# Patient Record
Sex: Male | Born: 1945
Health system: Southern US, Community
[De-identification: ages and names within clinical notes are randomized; demographics above are authoritative.]

## PROBLEM LIST (undated history)

## (undated) DIAGNOSIS — I619 Nontraumatic intracerebral hemorrhage, unspecified: Secondary | ICD-10-CM

## (undated) DIAGNOSIS — E7800 Pure hypercholesterolemia, unspecified: Secondary | ICD-10-CM

## (undated) DIAGNOSIS — G4700 Insomnia, unspecified: Secondary | ICD-10-CM

## (undated) DIAGNOSIS — D126 Benign neoplasm of colon, unspecified: Secondary | ICD-10-CM

## (undated) DIAGNOSIS — K635 Polyp of colon: Secondary | ICD-10-CM

## (undated) DIAGNOSIS — K921 Melena: Secondary | ICD-10-CM

## (undated) DIAGNOSIS — R079 Chest pain, unspecified: Secondary | ICD-10-CM

## (undated) DIAGNOSIS — J309 Allergic rhinitis, unspecified: Secondary | ICD-10-CM

## (undated) DIAGNOSIS — D485 Neoplasm of uncertain behavior of skin: Secondary | ICD-10-CM

## (undated) DIAGNOSIS — I2510 Atherosclerotic heart disease of native coronary artery without angina pectoris: Secondary | ICD-10-CM

## (undated) DIAGNOSIS — T7840XA Allergy, unspecified, initial encounter: Secondary | ICD-10-CM

## (undated) DIAGNOSIS — Z8546 Personal history of malignant neoplasm of prostate: Secondary | ICD-10-CM

## (undated) DIAGNOSIS — I639 Cerebral infarction, unspecified: Secondary | ICD-10-CM

## (undated) DIAGNOSIS — R55 Syncope and collapse: Secondary | ICD-10-CM

## (undated) DIAGNOSIS — C801 Malignant (primary) neoplasm, unspecified: Secondary | ICD-10-CM

## (undated) DIAGNOSIS — F419 Anxiety disorder, unspecified: Secondary | ICD-10-CM

## (undated) DIAGNOSIS — E876 Hypokalemia: Secondary | ICD-10-CM

## (undated) DIAGNOSIS — N400 Enlarged prostate without lower urinary tract symptoms: Secondary | ICD-10-CM

## (undated) DIAGNOSIS — E785 Hyperlipidemia, unspecified: Secondary | ICD-10-CM

## (undated) DIAGNOSIS — I10 Essential (primary) hypertension: Secondary | ICD-10-CM

## (undated) HISTORY — DX: Neoplasm of uncertain behavior of skin: D48.5

## (undated) HISTORY — DX: Hypokalemia: E87.6

## (undated) HISTORY — DX: Anxiety disorder, unspecified: F41.9

## (undated) HISTORY — DX: Personal history of malignant neoplasm of prostate: Z85.46

## (undated) HISTORY — DX: Chest pain, unspecified: R07.9

## (undated) HISTORY — DX: Malignant (primary) neoplasm, unspecified: C80.1

## (undated) HISTORY — DX: Melena: K92.1

## (undated) HISTORY — DX: Hyperlipidemia, unspecified: E78.5

## (undated) HISTORY — PX: TONSILLECTOMY: SUR1361

## (undated) HISTORY — DX: Cerebral infarction, unspecified: I63.9

## (undated) HISTORY — DX: Polyp of colon: K63.5

## (undated) HISTORY — DX: Essential (primary) hypertension: I10

## (undated) HISTORY — DX: Benign prostatic hyperplasia without lower urinary tract symptoms: N40.0

## (undated) HISTORY — DX: Nontraumatic intracerebral hemorrhage, unspecified: I61.9

## (undated) HISTORY — DX: Atherosclerotic heart disease of native coronary artery without angina pectoris: I25.10

## (undated) HISTORY — PX: ANGIOPLASTY: SHX39

## (undated) HISTORY — DX: Allergic rhinitis, unspecified: J30.9

## (undated) HISTORY — DX: Allergy, unspecified, initial encounter: T78.40XA

## (undated) HISTORY — PX: COLONOSCOPY: SHX174

## (undated) HISTORY — DX: Pure hypercholesterolemia, unspecified: E78.00

## (undated) HISTORY — DX: Insomnia, unspecified: G47.00

## (undated) HISTORY — DX: Benign neoplasm of colon, unspecified: D12.6

## (undated) HISTORY — DX: Syncope and collapse: R55

## (undated) HISTORY — PX: POLYPECTOMY: SHX149

---

## 1999-01-31 ENCOUNTER — Inpatient Hospital Stay (HOSPITAL_COMMUNITY): Admission: EM | Admit: 1999-01-31 | Discharge: 1999-02-01 | Payer: Self-pay | Admitting: Emergency Medicine

## 1999-01-31 ENCOUNTER — Encounter: Payer: Self-pay | Admitting: Emergency Medicine

## 2000-08-29 ENCOUNTER — Ambulatory Visit (HOSPITAL_COMMUNITY): Admission: RE | Admit: 2000-08-29 | Discharge: 2000-08-29 | Payer: Self-pay | Admitting: Cardiology

## 2001-05-10 ENCOUNTER — Emergency Department (HOSPITAL_COMMUNITY): Admission: EM | Admit: 2001-05-10 | Discharge: 2001-05-10 | Payer: Self-pay | Admitting: Emergency Medicine

## 2001-05-10 ENCOUNTER — Encounter: Payer: Self-pay | Admitting: Emergency Medicine

## 2004-12-12 ENCOUNTER — Ambulatory Visit: Payer: Self-pay | Admitting: Internal Medicine

## 2005-01-02 ENCOUNTER — Ambulatory Visit: Payer: Self-pay | Admitting: Internal Medicine

## 2005-01-10 ENCOUNTER — Ambulatory Visit: Payer: Self-pay | Admitting: Endocrinology

## 2005-03-19 ENCOUNTER — Ambulatory Visit: Payer: Self-pay | Admitting: Internal Medicine

## 2005-04-09 ENCOUNTER — Ambulatory Visit: Payer: Self-pay | Admitting: Internal Medicine

## 2005-04-30 ENCOUNTER — Ambulatory Visit: Payer: Self-pay | Admitting: *Deleted

## 2005-05-02 ENCOUNTER — Ambulatory Visit: Payer: Self-pay | Admitting: Gastroenterology

## 2005-05-21 ENCOUNTER — Ambulatory Visit: Payer: Self-pay | Admitting: Internal Medicine

## 2005-05-25 ENCOUNTER — Emergency Department (HOSPITAL_COMMUNITY): Admission: EM | Admit: 2005-05-25 | Discharge: 2005-05-25 | Payer: Self-pay | Admitting: Emergency Medicine

## 2005-05-30 ENCOUNTER — Ambulatory Visit: Payer: Self-pay | Admitting: Gastroenterology

## 2006-06-04 ENCOUNTER — Inpatient Hospital Stay (HOSPITAL_COMMUNITY): Admission: EM | Admit: 2006-06-04 | Discharge: 2006-06-07 | Payer: Self-pay | Admitting: Emergency Medicine

## 2006-06-04 ENCOUNTER — Ambulatory Visit: Payer: Self-pay | Admitting: Internal Medicine

## 2006-06-11 ENCOUNTER — Ambulatory Visit: Payer: Self-pay | Admitting: Internal Medicine

## 2007-03-13 ENCOUNTER — Ambulatory Visit: Payer: Self-pay | Admitting: Internal Medicine

## 2007-04-02 ENCOUNTER — Ambulatory Visit: Payer: Self-pay | Admitting: Cardiovascular Disease

## 2007-07-24 ENCOUNTER — Ambulatory Visit: Payer: Self-pay | Admitting: Internal Medicine

## 2007-11-13 ENCOUNTER — Encounter: Payer: Self-pay | Admitting: Internal Medicine

## 2007-12-03 DIAGNOSIS — C801 Malignant (primary) neoplasm, unspecified: Secondary | ICD-10-CM

## 2007-12-03 DIAGNOSIS — Z8546 Personal history of malignant neoplasm of prostate: Secondary | ICD-10-CM

## 2007-12-03 HISTORY — DX: Malignant (primary) neoplasm, unspecified: C80.1

## 2007-12-03 HISTORY — PX: PROSTATECTOMY: SHX69

## 2007-12-03 HISTORY — DX: Personal history of malignant neoplasm of prostate: Z85.46

## 2007-12-31 ENCOUNTER — Encounter: Payer: Self-pay | Admitting: Urology

## 2007-12-31 ENCOUNTER — Inpatient Hospital Stay (HOSPITAL_COMMUNITY): Admission: RE | Admit: 2007-12-31 | Discharge: 2008-01-05 | Payer: Self-pay | Admitting: Urology

## 2008-03-24 ENCOUNTER — Encounter: Payer: Self-pay | Admitting: Internal Medicine

## 2008-03-25 ENCOUNTER — Encounter: Payer: Self-pay | Admitting: Internal Medicine

## 2008-03-29 ENCOUNTER — Telehealth: Payer: Self-pay | Admitting: Internal Medicine

## 2008-11-14 ENCOUNTER — Ambulatory Visit: Payer: Self-pay | Admitting: Internal Medicine

## 2008-11-14 DIAGNOSIS — Z8546 Personal history of malignant neoplasm of prostate: Secondary | ICD-10-CM

## 2008-11-14 DIAGNOSIS — I2510 Atherosclerotic heart disease of native coronary artery without angina pectoris: Secondary | ICD-10-CM | POA: Insufficient documentation

## 2008-11-14 DIAGNOSIS — J309 Allergic rhinitis, unspecified: Secondary | ICD-10-CM

## 2008-11-14 DIAGNOSIS — E785 Hyperlipidemia, unspecified: Secondary | ICD-10-CM

## 2008-11-14 DIAGNOSIS — K921 Melena: Secondary | ICD-10-CM

## 2008-11-14 DIAGNOSIS — I10 Essential (primary) hypertension: Secondary | ICD-10-CM | POA: Insufficient documentation

## 2008-11-16 ENCOUNTER — Ambulatory Visit: Payer: Self-pay | Admitting: Internal Medicine

## 2008-11-17 LAB — CONVERTED CEMR LAB
ALT: 18 units/L (ref 0–53)
AST: 19 units/L (ref 0–37)
Albumin: 4 g/dL (ref 3.5–5.2)
BUN: 17 mg/dL (ref 6–23)
Basophils Absolute: 0 10*3/uL (ref 0.0–0.1)
Basophils Relative: 0.1 % (ref 0.0–3.0)
CO2: 28 meq/L (ref 19–32)
Calcium: 9.3 mg/dL (ref 8.4–10.5)
Chloride: 105 meq/L (ref 96–112)
Creatinine, Ser: 1.1 mg/dL (ref 0.4–1.5)
Direct LDL: 250.3 mg/dL
Eosinophils Relative: 1.2 % (ref 0.0–5.0)
Glucose, Bld: 107 mg/dL — ABNORMAL HIGH (ref 70–99)
Hemoglobin: 16.2 g/dL (ref 13.0–17.0)
Lymphocytes Relative: 25.8 % (ref 12.0–46.0)
MCHC: 34.7 g/dL (ref 30.0–36.0)
Neutro Abs: 2.3 10*3/uL (ref 1.4–7.7)
RBC: 5.15 M/uL (ref 4.22–5.81)
TSH: 1.46 microintl units/mL (ref 0.35–5.50)
Total Bilirubin: 0.9 mg/dL (ref 0.3–1.2)
Total Protein: 6.5 g/dL (ref 6.0–8.3)
VLDL: 25 mg/dL (ref 0–40)
WBC: 3.6 10*3/uL — ABNORMAL LOW (ref 4.5–10.5)

## 2008-11-23 ENCOUNTER — Telehealth (INDEPENDENT_AMBULATORY_CARE_PROVIDER_SITE_OTHER): Payer: Self-pay | Admitting: *Deleted

## 2008-12-02 DIAGNOSIS — D126 Benign neoplasm of colon, unspecified: Secondary | ICD-10-CM

## 2008-12-02 HISTORY — DX: Benign neoplasm of colon, unspecified: D12.6

## 2009-01-13 ENCOUNTER — Ambulatory Visit: Payer: Self-pay | Admitting: Gastroenterology

## 2009-01-18 ENCOUNTER — Telehealth: Payer: Self-pay | Admitting: Gastroenterology

## 2009-01-24 ENCOUNTER — Encounter: Payer: Self-pay | Admitting: Gastroenterology

## 2009-01-24 ENCOUNTER — Ambulatory Visit: Payer: Self-pay | Admitting: Gastroenterology

## 2009-01-25 ENCOUNTER — Encounter: Payer: Self-pay | Admitting: Gastroenterology

## 2009-02-15 ENCOUNTER — Ambulatory Visit: Payer: Self-pay | Admitting: Internal Medicine

## 2009-02-16 LAB — CONVERTED CEMR LAB
ALT: 39 units/L (ref 0–53)
BUN: 11 mg/dL (ref 6–23)
Bilirubin, Direct: 0.2 mg/dL (ref 0.0–0.3)
CO2: 31 meq/L (ref 19–32)
Calcium: 9.2 mg/dL (ref 8.4–10.5)
Chloride: 103 meq/L (ref 96–112)
Cholesterol: 165 mg/dL (ref 0–200)
Creatinine, Ser: 1.1 mg/dL (ref 0.4–1.5)
Eosinophils Absolute: 0 10*3/uL (ref 0.0–0.7)
Eosinophils Relative: 1.2 % (ref 0.0–5.0)
HCT: 44.3 % (ref 39.0–52.0)
HDL: 37.9 mg/dL — ABNORMAL LOW (ref >39.00)
LDL Cholesterol: 113 mg/dL — ABNORMAL HIGH (ref 0–99)
Lymphs Abs: 0.9 10*3/uL (ref 0.7–4.0)
MCHC: 34.7 g/dL (ref 30.0–36.0)
MCV: 90.4 fL (ref 78.0–100.0)
Monocytes Absolute: 0.3 10*3/uL (ref 0.1–1.0)
Neutrophils Relative %: 68.1 % (ref 43.0–77.0)
Platelets: 133 10*3/uL — ABNORMAL LOW (ref 150.0–400.0)
Total Bilirubin: 1.2 mg/dL (ref 0.3–1.2)
Total CHOL/HDL Ratio: 4
Total Protein: 6.6 g/dL (ref 6.0–8.3)
Triglycerides: 71 mg/dL (ref 0.0–149.0)

## 2009-02-22 ENCOUNTER — Ambulatory Visit: Payer: Self-pay | Admitting: Internal Medicine

## 2009-02-22 DIAGNOSIS — G4700 Insomnia, unspecified: Secondary | ICD-10-CM | POA: Insufficient documentation

## 2009-02-22 DIAGNOSIS — D485 Neoplasm of uncertain behavior of skin: Secondary | ICD-10-CM | POA: Insufficient documentation

## 2009-03-20 ENCOUNTER — Encounter: Payer: Self-pay | Admitting: Internal Medicine

## 2009-03-21 ENCOUNTER — Encounter: Payer: Self-pay | Admitting: Internal Medicine

## 2009-03-21 ENCOUNTER — Ambulatory Visit: Payer: Self-pay | Admitting: Internal Medicine

## 2009-03-24 ENCOUNTER — Encounter: Payer: Self-pay | Admitting: Internal Medicine

## 2009-03-27 ENCOUNTER — Telehealth (INDEPENDENT_AMBULATORY_CARE_PROVIDER_SITE_OTHER): Payer: Self-pay | Admitting: *Deleted

## 2009-04-10 ENCOUNTER — Encounter: Payer: Self-pay | Admitting: Internal Medicine

## 2009-05-27 ENCOUNTER — Emergency Department (HOSPITAL_COMMUNITY): Admission: EM | Admit: 2009-05-27 | Discharge: 2009-05-27 | Payer: Self-pay | Admitting: Family Medicine

## 2009-06-12 ENCOUNTER — Ambulatory Visit: Payer: Self-pay | Admitting: Internal Medicine

## 2009-08-14 ENCOUNTER — Inpatient Hospital Stay (HOSPITAL_COMMUNITY): Admission: EM | Admit: 2009-08-14 | Discharge: 2009-08-16 | Payer: Self-pay | Admitting: Emergency Medicine

## 2009-08-14 ENCOUNTER — Ambulatory Visit: Payer: Self-pay | Admitting: Cardiovascular Disease

## 2009-08-15 ENCOUNTER — Ambulatory Visit: Payer: Self-pay | Admitting: Cardiovascular Disease

## 2009-08-15 ENCOUNTER — Other Ambulatory Visit: Payer: Self-pay | Admitting: Cardiology

## 2009-08-16 ENCOUNTER — Other Ambulatory Visit: Payer: Self-pay | Admitting: Cardiology

## 2009-08-21 ENCOUNTER — Encounter: Payer: Self-pay | Admitting: Cardiovascular Disease

## 2009-08-22 ENCOUNTER — Encounter: Payer: Self-pay | Admitting: Cardiovascular Disease

## 2009-08-31 DIAGNOSIS — R079 Chest pain, unspecified: Secondary | ICD-10-CM

## 2009-08-31 DIAGNOSIS — E876 Hypokalemia: Secondary | ICD-10-CM

## 2009-08-31 DIAGNOSIS — F411 Generalized anxiety disorder: Secondary | ICD-10-CM | POA: Insufficient documentation

## 2009-09-04 ENCOUNTER — Ambulatory Visit: Payer: Self-pay | Admitting: Cardiovascular Disease

## 2009-09-05 ENCOUNTER — Ambulatory Visit: Payer: Self-pay | Admitting: Internal Medicine

## 2009-09-05 LAB — CONVERTED CEMR LAB
Albumin: 4.1 g/dL (ref 3.5–5.2)
Basophils Absolute: 0 10*3/uL (ref 0.0–0.1)
CO2: 30 meq/L (ref 19–32)
Calcium: 9.3 mg/dL (ref 8.4–10.5)
Cholesterol: 160 mg/dL (ref 0–200)
Creatinine, Ser: 1.2 mg/dL (ref 0.4–1.5)
GFR calc non Af Amer: 64.71 mL/min (ref >60)
Glucose, Bld: 104 mg/dL — ABNORMAL HIGH (ref 70–99)
HCT: 43.3 % (ref 39.0–52.0)
HDL: 38.7 mg/dL — ABNORMAL LOW (ref >39.00)
Hemoglobin: 14.9 g/dL (ref 13.0–17.0)
Lymphs Abs: 0.7 10*3/uL (ref 0.7–4.0)
MCHC: 34.6 g/dL (ref 30.0–36.0)
MCV: 92.1 fL (ref 78.0–100.0)
Monocytes Absolute: 0.3 10*3/uL (ref 0.1–1.0)
Monocytes Relative: 8.5 % (ref 3.0–12.0)
Neutro Abs: 2.2 10*3/uL (ref 1.4–7.7)
Platelets: 112 10*3/uL — ABNORMAL LOW (ref 150.0–400.0)
RDW: 13.4 % (ref 11.5–14.6)
Sodium: 142 meq/L (ref 135–145)
Total Protein: 6.7 g/dL (ref 6.0–8.3)
Triglycerides: 62 mg/dL (ref 0.0–149.0)
VLDL: 12.4 mg/dL (ref 0.0–40.0)

## 2009-09-13 ENCOUNTER — Ambulatory Visit: Payer: Self-pay | Admitting: Internal Medicine

## 2009-09-13 DIAGNOSIS — R799 Abnormal finding of blood chemistry, unspecified: Secondary | ICD-10-CM

## 2009-09-13 DIAGNOSIS — R7989 Other specified abnormal findings of blood chemistry: Secondary | ICD-10-CM | POA: Insufficient documentation

## 2009-09-17 ENCOUNTER — Telehealth: Payer: Self-pay | Admitting: Internal Medicine

## 2009-12-18 ENCOUNTER — Ambulatory Visit: Payer: Self-pay | Admitting: Internal Medicine

## 2009-12-18 LAB — CONVERTED CEMR LAB
BUN: 12 mg/dL (ref 6–23)
Basophils Relative: 0.5 % (ref 0.0–3.0)
Calcium: 9.3 mg/dL (ref 8.4–10.5)
Chloride: 108 meq/L (ref 96–112)
Cholesterol: 169 mg/dL (ref 0–200)
Creatinine, Ser: 1.1 mg/dL (ref 0.4–1.5)
HCT: 43.9 % (ref 39.0–52.0)
Hemoglobin: 14.6 g/dL (ref 13.0–17.0)
LDL Cholesterol: 111 mg/dL — ABNORMAL HIGH (ref 0–99)
Lymphocytes Relative: 23.3 % (ref 12.0–46.0)
MCHC: 33.3 g/dL (ref 30.0–36.0)
Monocytes Relative: 9.6 % (ref 3.0–12.0)
Neutro Abs: 2.4 10*3/uL (ref 1.4–7.7)
RBC: 4.74 M/uL (ref 4.22–5.81)
Total CHOL/HDL Ratio: 4

## 2009-12-25 ENCOUNTER — Encounter: Payer: Self-pay | Admitting: Internal Medicine

## 2009-12-28 ENCOUNTER — Telehealth: Payer: Self-pay | Admitting: Internal Medicine

## 2010-01-04 ENCOUNTER — Ambulatory Visit: Payer: Self-pay | Admitting: Internal Medicine

## 2010-06-18 ENCOUNTER — Telehealth: Payer: Self-pay | Admitting: Internal Medicine

## 2010-06-19 ENCOUNTER — Telehealth: Payer: Self-pay | Admitting: Internal Medicine

## 2010-06-22 ENCOUNTER — Ambulatory Visit: Payer: Self-pay | Admitting: Internal Medicine

## 2010-06-22 LAB — CONVERTED CEMR LAB
ALT: 27 units/L (ref 0–53)
AST: 29 units/L (ref 0–37)
Albumin: 4.2 g/dL (ref 3.5–5.2)
BUN: 16 mg/dL (ref 6–23)
Basophils Relative: 0.5 % (ref 0.0–3.0)
Chloride: 104 meq/L (ref 96–112)
Cholesterol: 168 mg/dL (ref 0–200)
Eosinophils Absolute: 0.1 10*3/uL (ref 0.0–0.7)
Glucose, Bld: 101 mg/dL — ABNORMAL HIGH (ref 70–99)
Hemoglobin: 15.1 g/dL (ref 13.0–17.0)
Lymphocytes Relative: 19.4 % (ref 12.0–46.0)
MCHC: 34.9 g/dL (ref 30.0–36.0)
MCV: 90.3 fL (ref 78.0–100.0)
Monocytes Absolute: 0.3 10*3/uL (ref 0.1–1.0)
Neutro Abs: 3.2 10*3/uL (ref 1.4–7.7)
Potassium: 4 meq/L (ref 3.5–5.1)
RBC: 4.8 M/uL (ref 4.22–5.81)
Total Protein: 6.5 g/dL (ref 6.0–8.3)

## 2010-06-26 ENCOUNTER — Ambulatory Visit: Payer: Self-pay | Admitting: Internal Medicine

## 2010-10-24 ENCOUNTER — Ambulatory Visit: Payer: Self-pay | Admitting: Internal Medicine

## 2010-10-28 LAB — CONVERTED CEMR LAB
BUN: 17 mg/dL (ref 6–23)
Basophils Relative: 0.4 % (ref 0.0–3.0)
Chloride: 104 meq/L (ref 96–112)
Cholesterol: 171 mg/dL (ref 0–200)
Creatinine, Ser: 1.2 mg/dL (ref 0.4–1.5)
Eosinophils Relative: 2.9 % (ref 0.0–5.0)
Hemoglobin: 14.9 g/dL (ref 13.0–17.0)
LDL Cholesterol: 116 mg/dL — ABNORMAL HIGH (ref 0–99)
Lymphocytes Relative: 19 % (ref 12.0–46.0)
MCHC: 34.3 g/dL (ref 30.0–36.0)
Monocytes Relative: 8.2 % (ref 3.0–12.0)
Neutro Abs: 3.4 10*3/uL (ref 1.4–7.7)
RBC: 4.75 M/uL (ref 4.22–5.81)
Total CHOL/HDL Ratio: 4
WBC: 4.9 10*3/uL (ref 4.5–10.5)

## 2010-10-31 ENCOUNTER — Encounter: Payer: Self-pay | Admitting: Internal Medicine

## 2010-11-12 ENCOUNTER — Ambulatory Visit: Payer: Self-pay | Admitting: Cardiovascular Disease

## 2010-11-21 ENCOUNTER — Ambulatory Visit: Payer: Self-pay | Admitting: Internal Medicine

## 2011-01-03 NOTE — Progress Notes (Signed)
Summary: Effient rx  Phone Note Refill Request Message from:  Fax from Pharmacy on December 28, 2009 10:57 AM  Refills Requested: Medication #1:  EFFIENT 10 MG TABS 1 by mouth qd.   Dosage confirmed as above?Dosage Confirmed Pharmacy noted--?can patient switch to 90 day supply?  Initial call taken by: Lucious Groves,  December 28, 2009 10:57 AM  Follow-up for Phone Call        OK 90 d x 3 Follow-up by: Tresa Garter MD,  December 28, 2009 1:02 PM    Prescriptions: EFFIENT 10 MG TABS (PRASUGREL HCL) 1 by mouth qd  #90 x 3   Entered by:   Lamar Sprinkles, CMA   Authorized by:   Tresa Garter MD   Signed by:   Lamar Sprinkles, CMA on 12/28/2009   Method used:   Electronically to        [facility]* (retail)       [address] [place], [place]  [address]       Ph: [phone] or [phone]       Fax: [phone]   RxID:   [id number]

## 2011-01-03 NOTE — Assessment & Plan Note (Signed)
Summary: FOLLOW UP-LB   Vital Signs:  Patient profile:   65 year old male Height:      68 inches Weight:      185 pounds BMI:     28.23 O2 Sat:      97 % on Room air Temp:     97.5 degrees F oral Pulse rate:   55 / minute Pulse rhythm:   regular Resp:     16 per minute BP sitting:   120 / 78  (left arm) Cuff size:   regular  Vitals Entered By: Lanier Prude, CMA(AAMA) (June 26, 2010 4:09 PM)  O2 Flow:  Room air CC: f/u Is Patient Diabetic? No   CC:  f/u.  History of Present Illness: The patient presents for a follow up of hypertension, CAD, hyperlipidemia   Current Medications (verified): 1)  Vytorin 10-40 Mg  Tabs (Ezetimibe-Simvastatin) .... Take 1 Tablet By Mouth Once A Day 2)  Labetalol Hcl 300 Mg  Tabs (Labetalol Hcl) .[name].. 1 By Mouth Bid 3)  Zolpidem Tartrate 10 Mg  Tabs (Zolpidem Tartrate) .... 1/2 or 1 By Mouth At [facility] Prn 4)  Vitamin D3 1000 Unit  Tabs (Cholecalciferol) .[name].. 1 By Mouth Qd 5)  Loratadine 10 Mg  Tabs (Loratadine) .... Once Daily As Needed Allergies 6)  Triamcinolone Acetonide 0.5 % Crea (Triamcinolone Acetonide) .... Use Bid 7)  Benicar Hct 40-25 Mg  Tabs (Olmesartan Medoxomil-Hctz) .... Take 1 Tab By Mouth Daily 8)  Effient 10 Mg Tabs (Prasugrel Hcl) .[name].. 1 By Mouth Qd  Allergies (verified): 1)  ! * Shell Fish 2)  ! 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   Successful PCI of the lesion in the mid LAD using a XIENCE drug- eluting stent with improvement in central narrowing from 95% to 0%. Bruce Elvera Lennox Juanda Chance, MD, [facility] BRB/MEDQ  D:  08/15/2009  T:  08/16/2009  Job:  [id number]   cc:   Verne Carrow, MD Georgina Quint [name], MD   Tonsillectomy  Social History: Last updated: 08/31/2009  The patient uses alcohol occasionally with one glass of   wine on the rare occasion.  He denies the use of tobacco or illicit   drugs.  He has never smoked.  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  He has been divorced for 8 years and has three   children.   Review of Systems  The patient denies fever, chest pain, and abdominal pain.    Physical Exam  General:  NAD, overweight-appearing.   Ears:  External ear exam shows no significant lesions or deformities.  Otoscopic examination reveals clear canals, tympanic membranes are intact bilaterally without bulging, retraction, inflammation or discharge. Hearing is grossly normal bilaterally. Nose:  External nasal examination shows no deformity or  inflammation. Nasal mucosa are pink and moist without lesions or exudates. Mouth:  Oral mucosa and oropharynx without lesions or exudates.  Teeth in good repair. Lungs:  Normal respiratory effort, chest expands symmetrically. Lungs are clear to auscultation, no crackles or wheezes. Heart:  Normal rate and regular rhythm. S1 and S2 normal without gallop, murmur, click, rub or other extra sounds. Abdomen:  Bowel sounds positive,abdomen soft and non-tender without masses, organomegaly or hernias noted. Msk:  No deformity or scoliosis noted of thoracic or lumbar spine.   Extremities:   No clubbing, cyanosis, edema, or deformity noted with normal full range of motion of all joints.   Neurologic:  No cranial nerve deficits noted. Station and gait are normal. Plantar reflexes are down-going bilaterally. DTRs are symmetrical throughout. Sensory, motor and coordinative functions appear intact. Skin:  Intact without suspicious lesions or rashes Psych:  Cognition and judgment appear intact. Alert and cooperative with normal attention span and concentration. No apparent delusions, illusions, hallucinations   Impression & Recommendations:  Problem # 1:  CORONARY ARTERY DISEASE (ICD-414.00) Assessment Unchanged  His updated medication list for this problem includes:    Labetalol Hcl 300 Mg Tabs (Labetalol hcl) .[name]... 1 by mouth bid    Benicar Hct 40-25 Mg Tabs (Olmesartan medoxomil-hctz) .[name]... Take 1 tab by mouth daily    Effient 10 Mg Tabs (Prasugrel hcl) .[name]... 1 by mouth qd  Problem # 2:  HYPERTENSION (ICD-401.9) Assessment: Unchanged  His updated medication list for this problem includes:    Labetalol Hcl 300 Mg Tabs (Labetalol hcl) .[name]... 1 by mouth bid    Benicar Hct 40-25 Mg Tabs (Olmesartan medoxomil-hctz) .[name]... Take 1 tab by mouth daily  BP today: 120/78 Prior BP: 116/74 (01/04/2010)  Labs Reviewed: K+: 4.0 (06/22/2010) Creat: : 1.2 (06/22/2010)   Chol: 168 (06/22/2010)   HDL: 35.10 (06/22/2010)   LDL: 113 (06/22/2010)   TG: 101.0 (06/22/2010)  Problem # 3:  ANXIETY (ICD-300.00) Assessment: Improved  Problem # 4:  HYPERCHOLESTEROLEMIA (ICD-272.0) Assessment: Unchanged  His updated medication list for this problem includes:    Vytorin 10-40 Mg Tabs (Ezetimibe-simvastatin) .[name]... Take 1 tablet by mouth once a day - he will discuss w/Card if to cont or to switch...  Complete Medication List: 1)  Vytorin 10-40 Mg Tabs (Ezetimibe-simvastatin) .... Take 1 tablet by mouth once a day 2)  Labetalol Hcl 300 Mg Tabs (Labetalol hcl) .[name].. 1 by mouth bid 3)  Zolpidem  Tartrate 10 Mg Tabs (Zolpidem tartrate) .... 1/2 or 1 by mouth at hs prn 4)  Vitamin D3 1000 Unit Tabs (Cholecalciferol) .[name].. 1 by mouth qd 5)  Loratadine 10 Mg Tabs (Loratadine) .... Once daily as needed allergies 6)  Triamcinolone Acetonide 0.5 % Crea (Triamcinolone acetonide) .... Use bid 7)  Benicar Hct 40-25 Mg Tabs (Olmesartan medoxomil-hctz) .... Take 1 tab by mouth daily 8)  Effient 10 Mg Tabs (Prasugrel hcl) .[name].. 1 by mouth qd 9)  Flonase 50 Mcg/act Susp (Fluticasone propionate) .[name].. 1 spr each nostr qd as needed  Patient Instructions: 1)  Please schedule a follow-up appointment in 4 months. 2)  BMP prior to visit, ICD-9: 3)  Lipid Panel prior to visit, ICD-9: 995.20 272.20 4)  CBC w/ Diff prior to visit, ICD-9: Prescriptions: ZOLPIDEM TARTRATE 10 MG  TABS (ZOLPIDEM TARTRATE) 1/2 or 1 by mouth at hs prn  #30 x 6   Entered and Authorized by:   Tresa Garter MD   Signed by:   Georgina Quint [name]  MD on 06/26/2010   Method used:   Print then Give to Patient   RxID:   [id number] FLONASE 50 MCG/ACT SUSP (FLUTICASONE PROPIONATE) 1 spr each nostr qd as needed  #1 x 3   Entered and Authorized by:   Tresa Garter MD   Signed by:   Tresa Garter MD on 06/26/2010   Method used:   Print then Give to Patient   RxID:   [id number]

## 2011-01-03 NOTE — Letter (Signed)
Summary: [facility]   Imported By: Sherian Rein 01/05/2010 14:08:12  _____________________________________________________________________  External Attachment:    Type:   Image     Comment:   External Document

## 2011-01-03 NOTE — Assessment & Plan Note (Signed)
Summary: 4 MO ROV /NWS #   Vital Signs:  Patient profile:   65 year old male Height:      68 inches Weight:      187 pounds BMI:     28.54 Temp:     98.2 degrees F oral Pulse rate:   84 / minute Pulse rhythm:   regular Resp:     16 per minute BP sitting:   120 / 90  (left arm) Cuff size:   regular  Vitals Entered By: Lanier Prude, Beverly Gust) (November 21, 2010 10:19 AM) CC: 4 mo f/u  Is Patient Diabetic? No   CC:  4 mo f/u .  History of Present Illness: The patient presents for a follow up of hypertension, diabetes, hyperlipidemia  C/o itchy ears  Current Medications (verified): 1)  Vytorin 10-40 Mg  Tabs (Ezetimibe-Simvastatin) .... Take 1 Tablet By Mouth Once A Day 2)  Labetalol Hcl 300 Mg  Tabs (Labetalol Hcl) .[name].. 1 By Mouth Bid 3)  Zolpidem Tartrate 10 Mg  Tabs (Zolpidem Tartrate) .... 1/2 or 1 By Mouth At [facility] Prn 4)  Vitamin D3 1000 Unit  Tabs (Cholecalciferol) .[name].. 1 By Mouth Qd 5)  Loratadine 10 Mg  Tabs (Loratadine) .... Once Daily As Needed Allergies 6)  Triamcinolone Acetonide 0.5 % Crea (Triamcinolone Acetonide) .... Use Bid 7)  Benicar Hct 40-25 Mg  Tabs (Olmesartan Medoxomil-Hctz) .... Take 1 Tab By Mouth Daily 8)  Effient 10 Mg Tabs (Prasugrel Hcl) .[name].. 1 By Mouth Qd 9)  Flonase 50 Mcg/act Susp (Fluticasone Propionate) .[name].. 1 Spr Each Nostr Qd As Needed  Allergies (verified): 1)  ! * Shell Fish 2)  ! 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  He denies the use of tobacco or illicit   drugs.  He has never smoked.  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  He has been divorced for 8 years and has three   children.   Review of Systems  The patient denies fever, weight loss, weight gain, chest pain, and dyspnea on exertion.    Physical Exam  General:  NAD, overweight-appearing.   Eyes:  No corneal or conjunctival inflammation noted. EOMI. Perrla.  Ears:  Flaky skin in ear canals Mouth:  Oral mucosa and oropharynx without lesions or exudates.  Teeth in good repair. Lungs:  Normal respiratory effort, chest expands symmetrically. Lungs are clear to auscultation, no crackles or wheezes. Heart:  Normal rate and regular rhythm. S1 and S2 normal without gallop, murmur, click, rub or other extra sounds. Abdomen:  Bowel sounds positive,abdomen soft and non-tender without masses, organomegaly or hernias noted. Msk:  No deformity or scoliosis noted of thoracic or lumbar spine.   Extremities:  No clubbing, cyanosis, edema, or deformity noted with normal full range of motion of all joints.   Skin:  Intact without suspicious lesions or rashes Psych:  Cognition and  judgment appear intact. Alert and cooperative with normal attention span and concentration. No apparent delusions, illusions, hallucinations   Impression & Recommendations:  Problem # 1:  CORONARY ARTERY DISEASE (ICD-414.00) Assessment New  His updated medication list for this problem includes:    Labetalol Hcl 300 Mg Tabs (Labetalol hcl) .[name]... 1 by mouth bid    Benicar Hct 40-25 Mg Tabs (Olmesartan medoxomil-hctz) .[name]... Take 1 tab by mouth daily    Effient 10 Mg Tabs (Prasugrel hcl) .[name]... 1 by mouth qd  Labs Reviewed: Chol: 171 (10/24/2010)   HDL:  39.90 (10/24/2010)   LDL: 116 (10/24/2010)   TG: 75.0 (10/24/2010)  Problem # 2:  CBC, ABNORMAL (ICD-790.99) Assessment: Unchanged The labs were reviewed with the patient.   Problem # 3:  HYPERTENSION (ICD-401.9) Assessment: Unchanged  His updated medication list for this problem includes:    Labetalol Hcl 300 Mg Tabs (Labetalol hcl) .[name]... 1 by mouth bid    Benicar Hct 40-25 Mg Tabs (Olmesartan medoxomil-hctz) .[name]... Take 1 tab by mouth daily  BP today: 120/90 Prior BP: 120/78 (06/26/2010)  Labs Reviewed: K+: 3.6 (10/24/2010) Creat: : 1.2 (10/24/2010)   Chol: 171 (10/24/2010)   HDL: 39.90 (10/24/2010)   LDL: 116 (10/24/2010)   TG: 75.0 (10/24/2010)  Problem # 4:  HYPERCHOLESTEROLEMIA (ICD-272.0) Assessment: Improved  His updated medication list for this problem includes:    Vytorin 10-40 Mg Tabs (Ezetimibe-simvastatin) .[name]... Take 1 tablet by mouth once a day  Labs Reviewed: SGOT: 29 (06/22/2010)   SGPT: 27 (06/22/2010)   HDL:39.90 (10/24/2010), 35.10 (06/22/2010)  LDL:116 (10/24/2010), 113 (06/22/2010)  Chol:171 (10/24/2010), 168 (06/22/2010)  Trig:75.0 (10/24/2010), 101.0 (06/22/2010)  Complete Medication List: 1)  Vytorin 10-40 Mg Tabs (Ezetimibe-simvastatin) .... Take 1 tablet by mouth once a day 2)  Labetalol Hcl 300 Mg Tabs (Labetalol hcl) .[name].. 1 by mouth bid 3)  Zolpidem Tartrate 10 Mg Tabs (Zolpidem tartrate) .... 1/2 or 1 by mouth at hs prn 4)  Vitamin D3 1000 Unit Tabs (Cholecalciferol) .[name].. 1 by mouth qd 5)  Loratadine 10 Mg Tabs (Loratadine) .... Once daily as needed allergies 6)  Triamcinolone Acetonide 0.5 % Crea (Triamcinolone acetonide) .... Use bid 7)  Benicar Hct 40-25 Mg Tabs (Olmesartan medoxomil-hctz) .... Take 1 tab by mouth daily 8)  Effient 10 Mg Tabs (Prasugrel hcl) .[name].. 1 by mouth qd 9)  Flonase 50 Mcg/act Susp (Fluticasone propionate) .[name].. 1 spr each nostr qd as needed 10)  Lotrisone 1-0.05 % Crea (Clotrimazole-betamethasone) .... Use bid  Patient  Instructions: 1)  Please schedule a follow-up appointment in 3 months. 2)  BMP prior to visit, ICD-9: 3)  Hepatic Panel prior to visit, ICD-9: 4)  HbgA1C prior to visit, ICD-9:790.29 Prescriptions: LOTRISONE 1-0.05 % CREA (CLOTRIMAZOLE-BETAMETHASONE) use bid  #90 g x 1   Entered and Authorized by:   Tresa Garter MD   Signed by:   Tresa Garter MD on 11/21/2010   Method used:   Electronically to        [facility]* (retail)       [address] [place], [place]  [address]       Ph: [phone] or [phone]       Fax: [phone]   RxID:   [id number]    Orders Added: 1)  Est. Patient Level IV [18841]    Contraindications/Deferment of Procedures/Staging:    Test/Procedure: FLU  VAX    Reason for deferment: patient declined

## 2011-01-03 NOTE — Assessment & Plan Note (Signed)
Summary: 3 MO ROV /NWS #     Vital Signs:  Patient profile:   65 year old male Weight:      184 pounds Temp:     97.6 degrees F oral Pulse rate:   58 / minute BP sitting:   116 / 74  (left arm)  Vitals Entered By: Tora Perches (January 04, 2010 10:23 AM) CC: f/u Is Patient Diabetic? No   CC:  f/u.  History of Present Illness: The patient presents for a follow up of hypertension, CAD, hyperlipidemia   Preventive Screening-Counseling & Management  Alcohol-Tobacco     Smoking Status: never  Current Medications (verified): 1)  Vytorin 10-40 Mg  Tabs (Ezetimibe-Simvastatin) .... Take 1 Tablet By Mouth Once A Day 2)  Labetalol Hcl 300 Mg  Tabs (Labetalol Hcl) .... Take 1/2  Tab By Mouth Twice A Day 3)  Zolpidem Tartrate 10 Mg  Tabs (Zolpidem Tartrate) .... 1/2 or 1 By Mouth At [facility] Prn 4)  Vitamin D3 1000 Unit  Tabs (Cholecalciferol) .[name].. 1 By Mouth Qd 5)  Loratadine 10 Mg  Tabs (Loratadine) .... Once Daily As Needed Allergies 6)  Triamcinolone Acetonide 0.5 % Crea (Triamcinolone Acetonide) .... Use Bid 7)  Benicar Hct 40-25 Mg  Tabs (Olmesartan Medoxomil-Hctz) .... Take 1 Tab By Mouth Daily 8)  Effient 10 Mg Tabs (Prasugrel Hcl) .[name].. 1 By Mouth Qd  Allergies: 1)  ! * Shell Fish 2)  ! 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   Successful PCI of the lesion in the mid LAD using a XIENCE drug- eluting stent with improvement in central narrowing from 95% to 0%. Bruce Elvera Lennox Juanda Chance, MD, [facility] BRB/MEDQ  D:  08/15/2009  T:  08/16/2009  Job:  [id number]   cc:   Verne Carrow, MD Georgina Quint Charley Lafrance, MD   Tonsillectomy  Family History: Last updated: 09/03/2009  The patient's father died of a myocardial infarction at   age 36.  The patient's mother died from a stroke at age 72.  He has one   brother who has diabetes mellitus and is obese.   Social History: Last updated: 2009/09/03  The patient uses alcohol occasionally with one glass of   wine on the rare occasion.  He denies the use of tobacco or illicit   drugs.  He has never smoked.  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  He has been divorced for 8 years and has three   children.   Review of Systems       The patient complains of weight gain.  The patient denies chest pain, dyspnea on exertion, abdominal pain, and hematochezia.    Physical Exam  General:  NAD, overweight-appearing.   Eyes:  No corneal or conjunctival inflammation noted.  EOMI. Perrla.  Ears:  External ear exam shows no significant lesions or deformities.  Otoscopic examination reveals clear canals, tympanic membranes are intact bilaterally without bulging, retraction, inflammation or discharge. Hearing is grossly normal bilaterally. Nose:  External nasal examination shows no deformity or inflammation. Nasal mucosa are pink and moist without lesions or exudates. Mouth:  Oral mucosa and oropharynx without lesions or exudates.  Teeth in good repair. Neck:  No deformities, masses, or tenderness noted. Lungs:  Normal respiratory effort, chest expands  symmetrically. Lungs are clear to auscultation, no crackles or wheezes. Heart:  Normal rate and regular rhythm. S1 and S2 normal without gallop, murmur, click, rub or other extra sounds. Abdomen:  Bowel sounds positive,abdomen soft and non-tender without masses, organomegaly or hernias noted. Msk:  No deformity or scoliosis noted of thoracic or lumbar spine.   Extremities:  No clubbing, cyanosis, edema, or deformity noted with normal full range of motion of all joints.   Neurologic:  No cranial nerve deficits noted. Station and gait are normal. Plantar reflexes are down-going bilaterally. DTRs are symmetrical throughout. Sensory, motor and coordinative functions appear intact. Skin:  Intact without suspicious lesions or rashes Psych:  Cognition and judgment appear intact. Alert and cooperative with normal attention span and concentration. No apparent delusions, illusions, hallucinations   Impression & Recommendations:  Problem # 1:  CORONARY ARTERY DISEASE (ICD-414.00) Assessment Unchanged  His updated medication list for this problem includes:    Labetalol Hcl 300 Mg Tabs (Labetalol hcl) .[name]... Take 1/2  tab by mouth twice a day    Benicar Hct 40-25 Mg Tabs (Olmesartan medoxomil-hctz) .[name]... Take 1 tab by mouth daily    Effient 10 Mg Tabs (Prasugrel hcl) .[name]... 1 by mouth qd  Problem # 2:  CBC, ABNORMAL (ICD-790.99) Assessment: Improved The labs were reviewed with the patient.   Problem # 3:  HYPERTENSION (ICD-401.9) Assessment: Unchanged  His updated medication list for this problem includes:    Labetalol Hcl 300 Mg Tabs (Labetalol hcl) .[name]... Take 1/2  tab by mouth twice a day    Benicar Hct 40-25 Mg Tabs (Olmesartan medoxomil-hctz) .[name]... Take 1 tab by mouth daily  Problem # 4:  PROSTATE CANCER, HX OF (ICD-V10.46) Assessment: Unchanged  Problem # 5:  HYPERCHOLESTEROLEMIA (ICD-272.0) Assessment: Comment Only The labs were reviewed with the patient.  His updated medication list for  this problem includes:    Vytorin 10-40 Mg Tabs (Ezetimibe-simvastatin) .[name]... Take 1 tablet by mouth once a day  Problem # 6:  INSOMNIA, PERSISTENT (ICD-307.42) Assessment: Improved On prescription therapy   Complete Medication List: 1)  Vytorin 10-40 Mg Tabs (Ezetimibe-simvastatin) .... Take 1 tablet by mouth once a day 2)  Labetalol Hcl 300 Mg Tabs (Labetalol hcl) .... Take 1/2  tab by mouth twice a day 3)  Zolpidem Tartrate 10 Mg Tabs (Zolpidem tartrate) .... 1/2 or 1 by mouth at hs prn 4)  Vitamin D3 1000 Unit Tabs (Cholecalciferol) .[name].. 1 by mouth qd 5)  Loratadine 10 Mg Tabs (Loratadine) .... Once daily as needed allergies 6)  Triamcinolone Acetonide 0.5 % Crea (Triamcinolone acetonide) .... Use bid 7)  Benicar Hct 40-25 Mg Tabs (Olmesartan medoxomil-hctz) .... Take 1 tab by mouth daily 8)  Effient 10 Mg Tabs (Prasugrel hcl) .[name].. 1 by mouth qd  Patient Instructions: 1)  BMP prior to visit, ICD-9: 2)  Hepatic Panel prior to visit, ICD-9: 3)  Lipid Panel prior to visit, ICD-9:272.0   4)  CBC w/ Diff prior to  visit, ICD-9:  5)  Pathol peripheral smear (Dx - low PLT and WBC) 995.20

## 2011-01-03 NOTE — Progress Notes (Signed)
Summary: Labetalol refill  Phone Note Refill Request Message from:  Fax from Pharmacy  Rf request is for Labetalol 300mg  1 two times a day.  EMR says 1/2 two times a day. Please advise   Method Requested: Electronic Initial call taken by: Lanier Prude, [facility]),  June 19, 2010 11:45 AM  Follow-up for Phone Call        ok 1 bid Follow-up by: Tresa Garter MD,  June 19, 2010 1:19 PM    New/Updated Medications: LABETALOL HCL 300 MG  TABS (LABETALOL HCL) 1 by mouth bid Prescriptions: LABETALOL HCL 300 MG  TABS (LABETALOL HCL) 1 by mouth bid  #60 x 3   Entered by:   Lucious Groves CMA   Authorized by:   Tresa Garter MD   Signed by:   Lucious Groves CMA on 06/19/2010   Method used:   Faxed to ...       [facility]* (retail)       [address] [place], [place]  [address]       Ph: [phone] or [phone]       Fax: [phone]   RxID:   [id number]

## 2011-01-03 NOTE — Letter (Signed)
[facility]   Imported By: Sherian Rein 11/22/2010 10:28:23  _____________________________________________________________________  External Attachment:    Type:   Image     Comment:   External Document

## 2011-01-03 NOTE — Progress Notes (Signed)
Summary: RF Zolpidem  Phone Note Refill Request Message from:  Fax from Pharmacy  Refills Requested: Medication #1:  ZOLPIDEM TARTRATE 10 MG  TABS 1/2 or 1 by mouth at hs prn   Dosage confirmed as above?Dosage Confirmed   Supply Requested: 1 month   Last Refilled: 05/02/2010  Method Requested: Telephone to Pharmacy Next Appointment Scheduled: 06-26-10 Initial call taken by: Lanier Prude, [facility]),  June 18, 2010 11:34 AM

## 2011-01-07 ENCOUNTER — Telehealth: Payer: Self-pay | Admitting: Internal Medicine

## 2011-01-17 NOTE — Progress Notes (Signed)
Summary: Rf Ambien  Phone Note Refill Request Message from:  Fax from Pharmacy  Refills Requested: Medication #1:  ZOLPIDEM TARTRATE 10 MG  TABS 1/2 or 1 by mouth at hs prn   Dosage confirmed as above?Dosage Confirmed   Supply Requested: 30   Last Refilled: 11/13/2010  Method Requested: Telephone to Pharmacy Next Appointment Scheduled: 03-11-11 Initial call taken by: Lanier Prude, [facility]),  January 07, 2011 3:02 PM  Follow-up for Phone Call        ok x 3 Thank you!  Follow-up by: Tresa Garter MD,  January 07, 2011 6:12 PM    Prescriptions: ZOLPIDEM TARTRATE 10 MG  TABS (ZOLPIDEM TARTRATE) 1/2 or 1 by mouth at hs prn  #30 x 3   Entered by:   Rock Nephew CMA   Authorized by:   Tresa Garter MD   Signed by:   Rock Nephew CMA on 01/08/2011   Method used:   Telephoned to ...       [facility]* (retail)       [address] [place], [place]  [address]       Ph: [phone] or [phone]       Fax: [phone]   RxID:   [id number]

## 2011-03-05 ENCOUNTER — Other Ambulatory Visit: Payer: Self-pay

## 2011-03-07 ENCOUNTER — Telehealth: Payer: Self-pay | Admitting: *Deleted

## 2011-03-07 DIAGNOSIS — E785 Hyperlipidemia, unspecified: Secondary | ICD-10-CM

## 2011-03-07 DIAGNOSIS — I10 Essential (primary) hypertension: Secondary | ICD-10-CM

## 2011-03-07 DIAGNOSIS — R5383 Other fatigue: Secondary | ICD-10-CM

## 2011-03-07 NOTE — Telephone Encounter (Signed)
Yes: CMET, Lipids, TSH  272.20  401.1 Thank you!

## 2011-03-07 NOTE — Telephone Encounter (Signed)
Patient requesting to know if he needs labs prior to his next apt 5/20?

## 2011-03-08 LAB — CK TOTAL AND CKMB (NOT AT ARMC)
CK, MB: 3 ng/mL (ref 0.3–4.0)
Relative Index: 0.3 (ref 0.0–2.5)
Total CK: 1032 U/L — ABNORMAL HIGH (ref 7–232)

## 2011-03-08 LAB — POCT CARDIAC MARKERS
CKMB, poc: 1.6 ng/mL (ref 1.0–8.0)
Troponin i, poc: <0.05 ng/mL (ref 0.00–0.09)
Troponin i, poc: <0.05 ng/mL (ref 0.00–0.09)

## 2011-03-08 LAB — BASIC METABOLIC PANEL
BUN: 14 mg/dL (ref 6–23)
BUN: 15 mg/dL (ref 6–23)
CO2: 27 mEq/L (ref 19–32)
Calcium: 9 mg/dL (ref 8.4–10.5)
Chloride: 105 mEq/L (ref 96–112)
Chloride: 104 mEq/L (ref 96–112)
GFR calc non Af Amer: >60 mL/min (ref >60)
Glucose, Bld: 168 mg/dL — ABNORMAL HIGH (ref 70–99)
Potassium: 3.5 mEq/L (ref 3.5–5.1)
Potassium: 3 mEq/L — ABNORMAL LOW (ref 3.5–5.1)
Potassium: 3.8 mEq/L (ref 3.5–5.1)
Sodium: 140 mEq/L (ref 135–145)
Sodium: 134 mEq/L — ABNORMAL LOW (ref 135–145)

## 2011-03-08 LAB — TSH: TSH: 1.816 u[IU]/mL (ref 0.350–4.500)

## 2011-03-08 LAB — TROPONIN I
Troponin I: 0.04 ng/mL (ref 0.00–0.06)
Troponin I: 0.05 ng/mL (ref 0.00–0.06)

## 2011-03-08 LAB — DIFFERENTIAL
Basophils Absolute: 0 10*3/uL (ref 0.0–0.1)
Basophils Relative: 0 % (ref 0–1)
Eosinophils Relative: 3 % (ref 0–5)
Lymphocytes Relative: 24 % (ref 12–46)
Monocytes Absolute: 0.4 10*3/uL (ref 0.1–1.0)

## 2011-03-08 LAB — BRAIN NATRIURETIC PEPTIDE: Pro B Natriuretic peptide (BNP): <30 pg/mL (ref 0.0–100.0)

## 2011-03-08 LAB — HEPATIC FUNCTION PANEL
Albumin: 3.8 g/dL (ref 3.5–5.2)
Total Protein: 6.4 g/dL (ref 6.0–8.3)

## 2011-03-08 LAB — CBC
HCT: 42.3 % (ref 39.0–52.0)
HCT: 44.9 % (ref 39.0–52.0)
Hemoglobin: 14.8 g/dL (ref 13.0–17.0)
MCV: 93.1 fL (ref 78.0–100.0)
Platelets: 145 10*3/uL — ABNORMAL LOW (ref 150–400)
RBC: 4.55 MIL/uL (ref 4.22–5.81)
RBC: 4.69 MIL/uL (ref 4.22–5.81)
RDW: 14.1 % (ref 11.5–15.5)
WBC: 13.8 10*3/uL — ABNORMAL HIGH (ref 4.0–10.5)
WBC: 7.4 10*3/uL (ref 4.0–10.5)

## 2011-03-08 LAB — CARDIAC PANEL(CRET KIN+CKTOT+MB+TROPI)
CK, MB: 2.7 ng/mL (ref 0.3–4.0)
Relative Index: 0.4 (ref 0.0–2.5)
Relative Index: 0.5 (ref 0.0–2.5)
Total CK: 740 U/L — ABNORMAL HIGH (ref 7–232)
Troponin I: 0.02 ng/mL (ref 0.00–0.06)
Troponin I: 0.03 ng/mL (ref 0.00–0.06)

## 2011-03-08 LAB — GLUCOSE, CAPILLARY

## 2011-03-08 LAB — URINALYSIS, ROUTINE W REFLEX MICROSCOPIC
Bilirubin Urine: NEGATIVE
Nitrite: NEGATIVE
Specific Gravity, Urine: 1.023 (ref 1.005–1.030)
pH: 6 (ref 5.0–8.0)

## 2011-03-08 LAB — LIPID PANEL
Cholesterol: 182 mg/dL (ref 0–200)
HDL: 53 mg/dL (ref >39)
HDL: 54 mg/dL (ref >39)
Total CHOL/HDL Ratio: 3.2 RATIO
VLDL: 13 mg/dL (ref 0–40)

## 2011-03-08 LAB — RAPID URINE DRUG SCREEN, HOSP PERFORMED
Cocaine: NOT DETECTED
Opiates: NOT DETECTED

## 2011-03-08 LAB — PROTIME-INR: INR: 1.1 (ref 0.00–1.49)

## 2011-03-08 LAB — HEMOGLOBIN A1C: Mean Plasma Glucose: 120 mg/dL

## 2011-03-11 ENCOUNTER — Ambulatory Visit: Payer: Self-pay | Admitting: Internal Medicine

## 2011-03-12 NOTE — Telephone Encounter (Signed)
Pt aware of labs. Hold open, need to order future labs.

## 2011-03-14 NOTE — Telephone Encounter (Signed)
Orders entered

## 2011-03-19 ENCOUNTER — Other Ambulatory Visit: Payer: Self-pay | Admitting: *Deleted

## 2011-03-19 MED ORDER — OLMESARTAN MEDOXOMIL-HCTZ 40-25 MG PO TABS: 1.0000 | ORAL_TABLET | Freq: Every day | ORAL | Status: DC

## 2011-03-21 ENCOUNTER — Telehealth: Payer: Self-pay | Admitting: *Deleted

## 2011-03-21 NOTE — Telephone Encounter (Signed)
Faxed PA form to [employer] for Benicar. Will wait for decision fax.

## 2011-04-03 NOTE — Telephone Encounter (Signed)
I called [employer] and they state med is covered. Pt should be able to fill at pharm. I informed pt and pharmacy of this. Per Pharm copay is approx $60. There is no way around this high copay except to change med to alt. At this time, pt states he has been on this med for years and would like to stay on it.

## 2011-04-16 NOTE — Op Note (Signed)
NAME:  Andrew Joseph, Andrew Joseph NO.:  [id number]   MEDICAL RECORD NO.:  [id number]          PATIENT TYPE:  INP   LOCATION:  1427                         FACILITY:  [facility]   PHYSICIAN:  Excell Seltzer. Annabell Howells, M.D.    DATE OF BIRTH:  [birth year]   DATE OF PROCEDURE:  12/31/2007  DATE OF DISCHARGE:                               OPERATIVE REPORT   PROCEDURE:  Robotic assisted laparoscopic radical prostatectomy with  bilateral pelvic lymphadenectomy.   PREOPERATIVE DIAGNOSIS:  T1C Gleason 7 adenocarcinoma of the prostate.   POSTOPERATIVE DIAGNOSIS:  T1C Gleason 7 adenocarcinoma of the prostate.   SURGEON:  Bjorn Pippin, M.D.   ASSISTANT:  Heloise Purpura, M.D.   ANESTHESIA:  General.   SPECIMEN:  Prostate, seminal vesicles, right and left obturator lymph  node packages, iliac obturator lymph node packages.   DRAINS:  20-French coude Foley catheter and #10 Blake drain.   BLOOD LOSS:  Approximately 300 mL.   COMPLICATIONS:  None.   INDICATIONS:  Andrew Joseph is a 65 year old white male who was initially  seen in consultation with an elevated PSA.  His biopsy demonstrated T1C  Gleason 7 adenocarcinoma of prostate with bilateral involvement.  After  discussion of the treatment options, he elected robotic prostatectomy  with lymph node dissection.   FINDINGS AND PROCEDURE:  He had been given Ancef and was taken to the  operating room where general anesthetic was induced.  He was placed in  lithotomy position after being fitted with PAS hose.  A red rubber  rectal catheter was placed and secured to the patient's leg with a  Toomey syringe and position.  His abdomen was clipped.  He was prepped  with a chlorhexidine solution because of a history of an iodine allergy.  He was then placed in steep Trendelenburg position per routine for the  robotic surgery and was draped in the usual sterile fashion.  A 20-  French Foley catheter was inserted.  The balloon was filled with 30 mL  of  sterile fluid.  The catheter was placed to an irrigation tubing.  At  this point a periumbilical incision site was marked 18 cm proximal to  the pubis to the left the umbilicus and a 2 cm incision was made with  the knife.  This was carried down through the subcutaneous tissues using  the Bovie and hemostat resection with [employer] retractors.  The  anterior rectus fascia was identified and was incised with the Bovie.  The rectus muscles were reflected using the [employer] retractors and the  posterior sheath was then nicked with a knife.  Hemostat was used to  puncture the peritoneum.  Once in the peritoneum, placement of an  examining finger confirmed intra-abdominal placement.  A 12 mm camera  port was then placed.  The skin incision was during the camera port with  figure-of-eight 0 Vicryl suture.  The abdomen was then insufflated on  low-flow.  Once a symmetrical abdominal rise was noted, he was changed  to high flow.  The remaining port sites were then marked in the standard  configuration for robotic  prostatectomy with a 12 mm lateral assistant  port on the right, a 5 mm right superior assistant port on the right, an  8 mm robot port on the right along with two 8 mm robot ports on the left  in the standard configuration.  Once the ports had been placed under  direct vision, the patient's legs were lowered and the robot was brought  into the field and docked.  Initially cautery scissors, PK and dissector  ProGrasps were placed through the working ports.  The bladder was then  filled with approximately 200 mL of fluid and the dissection was  initiated.  The obliterated umbilical arteries were divided and the  peritoneum was incised superior to the bladder.  The bladder was then  dropped away from the abdominal wall exposing the underside of the  pubis.  The bladder was then drained and the dissection was carried down  onto the anterior prostate which was then defatted.  The right   endopelvic fascia was incised and the lateral prostate plane was  developed.  This was then repeated on the right.  The dissection was  carried out on each side alongside the dorsal vein complex until the  groove between the dorsal vein complex and urethra could be identified.  An Endo-GIA stapler was then placed through the 12 mm assistant port and  secured on the dorsal vein complex.  It was allowed to compress the  tissues before a period of time because of the broadness of the dorsal  vein complex.  The puboprostatic ligaments had been divided prior to  this process.  The GIA was then fired and removed.  It appeared that  there was some residual component of the dorsal vein that had not been  stapled in the posterior portion of the complex.  This was initially  controlled by pressure during the dissection.  The bladder neck was then  identified with the aid of the Foley balloon.  The anterior bladder neck  was divided with cautery dissection.  The Foley catheter was then  identified, brought into the wound and used to provide anterior traction  on the prostate with the aid of the fourth arm.  Using this maneuver,  bleeding from the dorsal vein complex was well controlled.  We then  divided the posterior bladder neck.  It was a bit thin and we got into  the bladder neck just inferior to the trigone, but this was identified  and dealt with later in the case.  Once the posterior bladder neck had  been divided, the structures were identified.  The vas on the left was  dissected out, divided and used to provide anterior traction with the  fourth arm upon release of the Foley.  The left vas was dissected out  initially.  The right vas was then dissected out.  This was followed by  the left seminal vesicle and the right seminal vesicle.  There was  somewhat densely adherent tissue, particularly on the right of the base  of the seminal vesical, but a good plane was identified.  Denonvilliers   fascia was incised and the posterior plane between the prostate and  rectum was developed under direct vision without rectal injury.   At this point the structures were dropped and the edge of the prostate  was grasped with the fourth arm with anterolateral traction to the left.  The nerve spare was then performed on the left.  The prostatic fascia  was incised.  A plane was developed between the prostate and the  prostate fascia allowing release of the neurovascular bundle.  This was  then repeated on the right side using the prostate to provide  compression of the dorsal vein complex during the dissection.  Once both  nerve-sparing procedures had been completed, the left prostatic pedicle  was taken down using large clips.  This was followed by the left  prostatic pedicle.  Some residual apical attachments on both sides were  taken down.  We then turned our attention to the residual dorsal vein  which was divided using cautery.  The urethra was then divided using  sharp cold dissection.  Some residual rectourethralis attachments were  then released and the prostate was moved out of the operative field.  At  this point a 3-0 Vicryl figure-of-eight stitch was used to control the  oozing from the dorsal vein complex.  Once this stitch had been placed,  the bleeding was well-controlled.   At this point we turned our attention to the lymph node dissection.  The  left iliac vein was identified.  The tissue was dissected off the vein  allowing identification of the pelvic sidewall.  The lymph node package  was dissected up.  The obturator nerve was identified and the packet was  dissected off the obturator nerve.  Clips were used to control the  proximal and distal ends of the packet which was then removed from the  field for permanent pathology.  No obvious lymph node involvement was  noted.  We then turned our attention to the right lymph node dissection.  The lymphatic tissue was dissected  off the iliac vein out to the  sidewall.  A small vein off the iliac was divided.  It may have been the  circumflex, but it was rather proximal.  The obturator nerve was  identified.  The packet was dissected off the obturator nerve and once  again the proximal and distal ends of the packet were controlled with  large clips with great care being taken to avoid injury to the obturator  nerve as had been the case on the left side.  The packet was then  removed from the field.  Inspection revealed no active bleeding from the  lymph node dissection site.   At this point the posterior bladder neck was repaired using a running 2-  0 Vicryl suture.  Once again, great care was taken to avoid the ureteral  tunnels and orifices during the repair.  Once this small opening in the  posterior bladder neck had been repaired, a tension relieving stitch was  placed between the urethra, rectourethralis and posterior Denonvilliers  fascia initially incorporating the bladder neck, but this portion did  not hold.  A second suture was then placed from urethra to posterior  bladder neck to further relieve tension.  This was followed by a two  tone Monocryl running urethrovesical anastomosis.  Once the anastomosis  had been completed, a fresh 20-French coude catheter was inserted and  the balloon was filled with 15 mL of sterile fluid.  Initial irrigation  resulted in some extravasation from the anastomotic site.  Additional  tension on the urethrovesical anastomotic suture revealed slack that was  taken up and the suture was then retied.  Once this had been done, the  leakage from the urethrovesical anastomosis became quite minimal and  with some traction on the catheter was completely eliminated.  The  bladder was irrigated once again to confirm  good position and no  anastomotic leakage.   At this point, a #10 round Blake drain was placed through the fourth arm  port and the port was removed.  The drain was  secured to the skin with 3-  0 nylon suture once it had been positioned within the pelvis adjacent to  the node dissection sites and the anastomosis.   The specimen was then placed within an entrapment sac and the ports were  removed.  The 12 mm assistant port was closed using a needle passer and  2-0 Vicryl suture.  The remaining ports were removed under visual  inspection followed by the camera port.  The suture on the camera port  incision was released and the incision was expanded both at the skin  level and at the fascial level allowing removal of the specimen and the  entrapment sac.  The anterior rectus fascia was then closed using a  running 0 Vicryl suture.  All of the port sites were then infiltrated  with 0.25% Marcaine.  Hemostasis was assured and the skin was closed  with staples.  The Foley catheter was irrigated with clear return and  kept on light traction and was placed to straight drainage.  A dressing  was applied to the wounds.  The drapes were removed.  The patient was  taken down from lithotomy position.  His anesthetic was reversed.  He  was moved to the recovery room in stable condition.  He had been given  indigo carmine at two points during the procedure and blue urine was  noted to efflux from the ureteral orifices throughout the procedure.  Blood loss was approximately 300 mL and there were no complications.      Excell Seltzer. Annabell Howells, M.D.  Electronically Signed     JJW/MEDQ  D:  12/31/2007  T:  01/01/2008  Job:  [id number]

## 2011-04-16 NOTE — Assessment & Plan Note (Signed)
[facility]                            CARDIOLOGY OFFICE NOTE   JACEN, CARLINI                      MRN:          [id number]  DATE:04/02/2007                            DOB:          [birth year]    Mr. Soderquist is seen today at the request of Dr.  Posey Rea for chest pain  and PVCs.   I actually performed a catheterization on the patient back in 2000. He  is referred for chest pain and PVCs.   The patient still works as a traveling judge. About a month ago, up in  [place], he was awoken at 1 in the morning with sharp substernal chest  pain. He thought it may be related to some chocolate caramel milkshake  that he had earlier. There is a question of lactose intolerance.  However, he continued to have chest pain for about 7 days. This was  accompanied by palpitations which turned out to be PVCs. The pain was  not necessarily exertional. It was intermittent and fleeting, but lasted  for seven straight days. There was no associated diaphoresis, shortness  of breath or syncope.   The patient has not had recent exertional chest pain.   His cardiac history is somewhat interesting. I did a cath on him back in  2000, which was remarkable for 40% RCA lesion. The cath was uneventful.  He does have a SHRIMP AND IODINE ALLERGY. He was pre-medicated. He  subsequently had an IVUS procedure by Dr.  Juanda Chance and had severe pain.  Subsequently, the patient has had a non-ischemic Myoview study in 2003,  however, there were issues with his stress testing. The patient has bad  knees and cannot walk on a treadmill. He refuses to have adenosine again  as he had a horrible reaction to it. Similarly, he has had an  echocardiogram in the past and his windows apparently are not great.   The patient is interested in finding out what percent blockage he has in  his heart now particularly in the face of PVCs and chest pain a month  ago.   REVIEW OF SYSTEMS:  Is otherwise  remarkable for high blood pressure. The  patient has had what sounds like an intracerebral bleed last year. His  hypertension was being treated. However, he stopped his blood pressure  medicine and tried herbal medicines. He subsequently had systolics above  200 and had a bleed. He has made a complete recovery of this and had  initially seen Dr.  Gabriel Rung D. Venetia Maxon for it. He has had no other bleeding  diaphysis and no recurrent TIAs or CVAs.   He is clearly ALLERGIC TO LOBSTER, SHELLFISH AND HAS AN IODINE ALLERGY.   The patient's review of systems is otherwise negative.   He works as a Therapist, occupational and travels a lot. He is divorced. He  has three children. He says his life is fine except for being single.   He has not had previous surgeries.   His mother died at the age of 32 from a stroke. His father had a heart  attack at age  49.   The patient drinks two cups of coffee a day. He does not smoke. He tries  to walk two miles a day.   CURRENT MEDICATIONS:  1. Vytorin 10/40.  2. Aspirin a day.  3. Labetalol 300 b.i.d.  4. Benicar 40/12.5.   FAMILY HISTORY:  Was as indicated.   PHYSICAL EXAMINATION:  The blood pressure is excellent at 110/70, pulse  69 and regular. He otherwise is a healthy-appearing male with no  deficits. He is afebrile. Respiratory rate is 12.  HEENT: Is normal. Carotids are normal without bruit. There is no JVP  elevation and no thyromegaly.  LUNGS:  Are clear. There is no use of accessory muscles.  CARDIAC: Is normal without murmur, rub, gallop or click. PMI is not  palpable.  ABDOMEN: Is normal. There is no AAA. No hepatosplenomegaly. No  hepatojugular reflux and no masses.  Distal pulses are intact. His femorals are somewhat difficult to  palpate, but there are no bruits. There is no lower extremity edema.  NEURO: Is intact with no residual deficits from his CNS bleed.  MUSCULAR: Examination is normal with no weaknesses.   His baseline EKG is normal  with borderline LVH. There is an occasional  PVC.   IMPRESSION:  I had a long discussion with Mr. Hege lasting over 15  minutes about options.   The patient is very specific about his wants, needs and abilities to do  testing. He specifically wants to know what his percent blockages are.  He also refuses to have adenosine and cannot walk on a treadmill. His  echo images are suboptimal and a dobutamine echo would not appear to be  a reasonable test to answer his questions. He is concerned about his  chest pain and his PVCs. Given all of these factors, we have decided to  proceed with diagnostic heart cath.   He will be pre-medicated with prednisone 60 mg the night before, morning  of as well as over-the-counter Benadryl and Pepcid.   He is particularly concerned about making sure the procedure is not  painful. He had no problem with my previous diagnostic cath and I  suspect that his pain was from the rather large IVUS catheter that was  used for enrolling him in the reversal trial.   His risk factors are well-modified. His blood pressure control is  excellent and he is on Vytorin.   He will continue an aspirin a day.   He has no history of decreased LV function and I do not think that his  PVCs would be worrisome.   Further recommendations will be based on the results of his heart cath.  We will try to get this arranged within the next two to three weeks.   He will have routine chest x-ray and blood work as indicated for his pre-  cath workup.     Noralyn Pick. Eden Emms, MD, [facility]  Electronically Signed    PCN/MedQ  DD: 04/02/2007  DT: 04/02/2007  Job #: [id number]

## 2011-04-19 NOTE — H&P (Signed)
NAME:  Andrew Joseph, Andrew Joseph NO.:  [id number]   MEDICAL RECORD NO.:  [id number]          PATIENT TYPE:  INP   LOCATION:  1831                         FACILITY:  [facility]   PHYSICIAN:  Gordy Savers, M.D. LHCDATE OF BIRTH:  08-07-1946   DATE OF ADMISSION:  06/04/2006  DATE OF DISCHARGE:                                HISTORY & PHYSICAL   CHIEF COMPLAINT:  Headache   HISTORY OF PRESENT ILLNESS:  The patient is 65 year old white gentleman with  a long history of hypertension.  He had formally been treated with Benicar  40/25 daily as well as labetalol 2 mg b.i.d.  He self discontinued this  medication approximately 6 weeks ago and attempted a herbal solution for his  blood pressure and lipid disorders.  He was  stable until approximately two  days ago when he had the onset of headache and nausea and intermittent  dizziness.  The headache intensified today and became quite severe, and he  presented to the emergency room where a  CT of the head revealed a small  hemorrhagic stroke.  Initial blood pressure was 194/104.  He states that his  blood pressure was taken yesterday and was in excess of 200/100.  The  patient is now admitted for further evaluation and management of his acute  hemorrhagic stroke and accelerated hypertension.   PAST MEDICAL HISTORY:  1.  The patient has a long history of hypertension and hypercholesterolemia.  2.  He had a brief emergency department visit in 2002 for evaluation of      chest pain.  3.  He has a remote childhood tonsillectomy.   He denies any other hospital admissions.   ALLERGIES:  He states allergies to IODINE and SHELLFISH>   CURRENT MEDICATIONS:  1.  Benicar 40/25 daily.  2.  Labetalol 2 mg b.i.d.  3.  Unknown Statin daily.  4.  Vicodin p.r.n. pain   SOCIAL HISTORY:  He has remarried and accompanied by his wife.   FAMILY HISTORY:  Father died of an MI at age 63.  Mother died of hemorrhagic  stroke at 66.  One brother  has diabetes.   PHYSICAL EXAMINATION:  GENERAL:  Exam revealed a well-developed, mildly  overweight male who is uncomfortable and in no acute distress.  He was  alert, oriented with normal speech.  VITAL SIGNS:  Blood pressure was 135/78, pulse rate 68.  SKIN:  Warm and dry without rash.  NECK:  The neck revealed no signs of trauma.  Pupil responses were normal.  Oropharynx clear.  NECK:  The neck revealed no bruits.  No neck vein distension.  CHEST:  Clear anterolaterally.  CARDIOVASCULAR:  Exam revealed normal heart sounds.  No gallop.  ABDOMEN:  Soft, nontender.  No organomegaly.  EXTREMITIES:  Revealed full peripheral pulses.  No edema.  NEUROLOGIC:  Examination revealed the patient alert and oriented.  Cranial  examination revealed pupils to be position and reactive.  Extraocular  muscles were full.  There is no facial asymmetry.  Tongue and uvula were  midline.  Shoulder shrug and sternocleidomastoid muscle testing  unremarkable.  Motor  exam revealed no drift to the outstretched arms.  Reflexes were symmetrical.  Toes were downgoing.   IMPRESSION:  1.  Subacute hemorrhagic stroke.  2.  Accelerated hypertension.  3.  Hypercholesterolemia.   DISPOSITION:  The patient will be admitted to the neuro ICU.  The patient  has obtained nice blood pressure control after a single dose of IV  labetalol.  He will be maintained on oral labetalol as well as his Benicar.  Blood pressure will be monitored carefully.  He will followed by the  neurosurgical service.           ______________________________  Gordy Savers, M.D. [facility]     PFK/MEDQ  D:  06/04/2006  T:  06/04/2006  Job:  [id number]

## 2011-04-19 NOTE — Consult Note (Signed)
NAME:  Andrew Joseph, MAN NO.:  [id number]   MEDICAL RECORD NO.:  [id number]          PATIENT TYPE:  EMS   LOCATION:  MAJO                         FACILITY:  [facility]   PHYSICIAN:  Payton Doughty, M.D.      DATE OF BIRTH:  12/10/1945   DATE OF CONSULTATION:  06/04/2006  DATE OF DISCHARGE:                                   CONSULTATION   PLACE OF CONSULT:  Emergency room   REQUESTING PHYSICIAN:  ER doctor   DOCTOR DOING CONSULT:  Dr. Trey Sailors   I was called to see this 65 year old right-handed white gentleman with the  onset of headache 24-36 hours ago, much worse last night.  He has had some  nausea, no vomiting.  CT at [facility] shows a right inferior parietal occipital  intraparenchymal hemorrhage about 1.5 x 2.5 cm.   PAST MEDICAL HISTORY:  Hypertension.  He stopped taking his medications  about six weeks ago.  He has been taking some herbal medications.   SURGICAL HISTORY:  Denies.   ALLERGIES:  None.   SOCIAL HISTORY:  He does not smoke.  Occasionally drinks.  He is a traveling  judge.   Blood pressure in the emergency room is 194/104, untreated.  When treated  with labetalol it went into 140s/70s.  His HEENT is within normal limits.  Neck supple without lymphadenopathy.  Chest clear.  Cardiac examination is  regular rate and rhythm.  Abdomen soft, positive bowel sounds.  Extremities  without clubbing or cyanosis.  GU examination is deferred.  Neurologically  he is awake, alert, and oriented x3.  His pupils equal, round, react to  light.  He has a left superior __________.  Extraocular movements are full.  Facies are equal.  Tongue protrudes in the midline.  Motor examination is  5/5 with no drift.  Sensation is intact.  Reflexes are 1 throughout the  upper and lower extremities.  Downgoing toes.  CT has been reviewed above.  This is likely a hypertensive hemorrhage, although the possibility of  hemorrhage into a tumor or AVM is possible.  Will admit and observe,  control  his blood pressure.  Plan an MR in a month or so unless patient declines on  examination.           ______________________________  Payton Doughty, M.D.     MWR/MEDQ  D:  06/04/2006  T:  06/04/2006  Job:  [id number]

## 2011-04-19 NOTE — Discharge Summary (Signed)
NAME:  Andrew Joseph, Andrew Joseph NO.:  [id number]   MEDICAL RECORD NO.:  [id number]          PATIENT TYPE:  INP   LOCATION:  3037                         FACILITY:  [facility]   PHYSICIAN:  Danae Orleans. Venetia Maxon, M.D.  DATE OF BIRTH:  22-Sep-1946   DATE OF ADMISSION:  06/04/2006  DATE OF DISCHARGE:                                 DISCHARGE SUMMARY   REASON FOR ADMISSION:  Headache with intracerebral hemorrhage and  hypertension.   HISTORY OF PRESENT ILLNESS:  The patient is a 65 year old man with a long  history of hypertension, previously treated with Benicar 40/25 daily and  labetalol 200 mg twice daily.  He self discontinued his medications and  attempted an herbal solution for his blood pressure problems and lipid  problems and developed a hemorrhagic stroke.  Blood pressure initially was  194/104 and then 200/100.  He was admitted for management of hemorrhagic  stroke and accelerated hypertension.  This patient was doing better.  He was  observed and was stable.  He was gradually mobilized with better blood  pressure control and was discharged home on the 7th of July with discharge  medications of Avapro 300 mg daily, labetalol 200 mg twice daily,  hydrochlorothiazide 25 mg daily and Zocor 40 mg daily with instructions to  followup with Dr. Channing Mutters in his office in 3 weeks with Dr. Kristin Bruins, his  primary physician in the coming week.      Danae Orleans. Venetia Maxon, M.D.  Electronically Signed     JDS/MEDQ  D:  06/07/2006  T:  06/07/2006  Job:  [id number]

## 2011-04-19 NOTE — Discharge Summary (Signed)
NAME:  Andrew Joseph, Andrew Joseph NO.:  [id number]   MEDICAL RECORD NO.:  [id number]          PATIENT TYPE:  INP   LOCATION:  1427                         FACILITY:  [facility]   PHYSICIAN:  Excell Seltzer. Annabell Howells, M.D.    DATE OF BIRTH:  12/06/1945   DATE OF ADMISSION:  12/31/2007  DATE OF DISCHARGE:  01/05/2008                               DISCHARGE SUMMARY   BRIEF HISTORY:  Mr. Allender is a 65 year old white male with a T1c  Gleason 7 adenocarcinoma of the prostate who is admitted for robotic  radical prostatectomy.   PAST MEDICAL HISTORY:  Significant for hypercholesterolemia,  hypertension, a prior hemorrhagic stroke in July 2007.  Went off his  blood pressure medications.   PAST SURGICAL HISTORY:  Unremarkable.   ALLERGIES:  SHELLFISH and IODINE.   MEDICATIONS:  1. Labetalol 300 mg two times nightly.  2. Benicar 25 mg nightly as well as Vytorin.   For additional details of History and Physical, please see the office  H&P.   HOSPITAL COURSE:  On the day of admission the patient underwent a  robotic radical prostatectomy with pelvic lymphadenectomy.  He was left  with a Blake drain and a Foley catheter.  Postoperatively he had reduced  urine output.  His Foley was placed on traction because of minimal  anastomotic leakage at surgery.  His JP drainage was minimal.  His Foley  irrigated with good return.  Fluids were pushed with increased IV fluid  rates and boluses.  On the first postoperative day his urine output had  been 250 mL over eight hours.  His JP drainage was 95 mL over eight  hours.  His abdomen was distended, but he had good bowel sounds.  Hemoglobin was 14.7 in the PACU and 13.3 on postop day #1, but he had  been hydrated vigorously.  His Jackson-Pratt fluid was sent for  creatinine, and Foley traction was maintained.  On the evening of the  first postoperative day I returned to see the patient.  He had had only  50 mL out per his Foley since 8 in the morning  since I had seen him on  rounds and 400 out per his JP.  The JP creatinine was 134 consistent  with urine.  His catheter was maintained on traction.  On the third  postoperative day he had increased abdominal pain that was felt to be  related to an ileus, and an NG was placed with return of 200 mL of bowel  stained fluid.  Flat and upright of abdomen revealed a significant  ileus.  The NG tube provided comfort to the patient.  On the third  postoperative day the patient's pain was better.  His Jackson-Pratt  drainage remained significant, but his urine output had increased  significantly per the Foley.  His chemistries demonstrated a creatinine  of 2.01 down from 2.42 on January 31.  The NG tube was removed by the  patient and left out.  On the fourth postoperative day he was doing much  better with reduced nausea.  His Jackson-Pratt output was down to 30  mL  for 24 hours; the Foley output was 850 mL over 8 hours.  BNP was normal  except for glucose of 110.  His creatinine had normalized.  A cystogram  was obtained which revealed some contained extravasation at the base of  the bladder.  The Jackson-Pratt drain was removed.  On the fifth  postoperative day the patient was felt to be ready for discharge.  He  had had a bowel movement.  He was afebrile.  His vital signs were  stable.  His abdomen was soft with positive bowel sounds.  His Al Pimple had only had 10 mL out over 24 hours and had a good urine output.   DISCHARGE DIAGNOSIS:  T2c, N0, MX Gleason 7 3+4 adenocarcinoma of the  prostate.  His postoperative complications included an ileus and an  anastomotic leak.   DISCHARGE MEDICATIONS:  Percocet, Colace, and Cipro.   FOLLOWUP:  He is instructed to follow up this week for staple removal  and will have his Foley left in for two weeks with a cystogram at two  week follow-up.   DISPOSITION:  Home.   PROGNOSIS:  Good.   CONDITION ON DISCHARGE:  Improved.      Excell Seltzer.  Annabell Howells, M.D.  Electronically Signed     JJW/MEDQ  D:  02/02/2008  T:  02/02/2008  Job:  [id number]

## 2011-04-19 NOTE — Cardiovascular Report (Signed)
. [facility]  Patient:    Andrew Joseph, Andrew Joseph                      MRN: [id number] Proc. Date: 08/29/00 Adm. Date:  [id number] Attending:  Lenoria Farrier CC:         Cecil Cranker, M.D. [facility] C. Andrey Campanile, M.D.  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  He underwent catheterization 18 months ago and had a 50% LAD lesion.  He was enrolled in the REVERSAL trial and underwent an IVUS procedure.  He had a lot of pain during and post IVUS procedure.  He has done well on study drug and has had very minimal pain, although he previously had some exertional pain.  This seems less pronounced now.  We had done a Cardiolite after his last catheterization which showed no evidence of ischemia.  He returns now for followup catheterization and IVUS as part of the REVERSAL trial.  DESCRIPTION OF PROCEDURE:  The procedure was performed via the right femoral artery using an arterial sheath and 6 French preformed coronary catheters.  A front wall arterial puncture was performed and Omnipaque contrast was used. With each injection the patient had quite a bit of pain.  For this reason we tried to sedate him heavily with a total of 3 of Versed and 3 of morphine.  We used a JL4 guiding catheter and a short floppy wire for the IVUS run.  We passed the wire down the LAD without difficulty.  Initially we passed the IVUS catheter all the way to the distal LAD but the catheter malfunctioned and the patient had significant pain so we had to pull the catheter back.  On the next IVUS run we elected not to cross the 50% mid lesion in hopes of reducing ischemia.  The catheter was positioned just proximal to the large diagonal branch.  The landmarks were a septal perforator, a small diagonal branch and a more proximal septal perforator.  We performed an automatic  pullback.  The patient had mild to moderate pain with this but tolerated this run fairly well.  Repeat diagnostics were then performed through the guiding catheter. Multiple doses of intracoronary nitroglycerin were given before and after the IVUS runs.  Despite the pain of the procedure, the patient was otherwise stable during the procedure and left the laboratory in stable condition.  We closed the right femoral artery with Perclose at the end of the procedure. The patient had been given weight-adjusted heparin to prolong the ACT greater than 200 seconds.  RESULTS:  The left main coronary artery:  The left main coronary artery was free of significant disease.  Left anterior descending:  The left anterior descending artery gave rise to three septal perforators and a small and large diagonal branch.  There was 30% proximal stenosis, 30% and 50% mid stenosis and 50% distal stenosis.  Circumflex artery:  The circumflex artery gave rise to a marginal branch and a posterolateral branch.  There was 40% narrowing in the proximal portion of the marginal branch.  Right coronary artery:  The right coronary was a moderate sized vessel that gave rise to a conus branch, right ventricular branch, and two very small posterior descending branches.  This vessel was free of significant disease.  LEFT VENTRICULOGRAPHY:  The left ventriculogram performed in the RAO projection showed good white male  with no areas of hypokinesis.  The estimated ejection fraction was 55%.  The IVUS run showed a moderate plaque in the proximal LAD.  The vessel was about a 4 or 5 vessel and the smallest lumen in this area was about 3 x 3.5. There was moderate plaque throughout the proximal LAD.  CONCLUSIONS: 1. Coronary artery disease with 30% proximal, 50% mid and 50% distal    stenosis in the left anterior descending artery, 40% narrowing in a    marginal branch of the circumflex artery and no significant disease in  the    right coronary with good left ventricular function. 2. Moderate plaque in the proximal left anterior descending by intravascular    ultrasound done as part of the REVERSAL trial.  RECOMMENDATIONS:  There is no obvious progression or regression of disease from the previous study.  We will plan to measure a fasting lipid profile now and then place the patient on Lipitor targeting an LDL of less than 100.  We think his LDL may not have been less than 100 because Niacin was recommended to be added to his study drug during the study period.  We will arrange followup with Dr. Corinda Gubler. DD:  08/29/00 TD:  08/29/00 Job: [id number]

## 2011-04-22 ENCOUNTER — Ambulatory Visit: Payer: Self-pay | Admitting: Internal Medicine

## 2011-05-08 ENCOUNTER — Ambulatory Visit: Payer: Self-pay | Admitting: Internal Medicine

## 2011-05-20 ENCOUNTER — Other Ambulatory Visit: Payer: Self-pay | Admitting: *Deleted

## 2011-05-20 ENCOUNTER — Telehealth: Payer: Self-pay | Admitting: *Deleted

## 2011-05-20 MED ORDER — PRASUGREL HCL 10 MG PO TABS: 10.0000 mg | ORAL_TABLET | Freq: Every day | ORAL | Status: DC

## 2011-05-20 NOTE — Telephone Encounter (Signed)
pts ins wants him to try Losartan HCTZ instead of Benicar HCT  OR call for PA at [phone] pt ID is [id number] if Losartan is not acceptable. Please advise

## 2011-05-20 NOTE — Telephone Encounter (Signed)
Pt came by office and was given #28 samples of Benicar HCT 40-25mg .  Pt request to stay on Benicar. I will begin PA process.

## 2011-05-21 NOTE — Telephone Encounter (Signed)
rec additional fax from pharmacy stating pt is willing to try Losartan/HCTZ afterall. Please advise strength, sig and quantity.

## 2011-05-23 MED ORDER — LOSARTAN POTASSIUM-HCTZ 100-25 MG PO TABS: 1.0000 | ORAL_TABLET | Freq: Every day | ORAL | Status: DC

## 2011-05-23 NOTE — Telephone Encounter (Signed)
Losart HCT 100-25

## 2011-05-23 NOTE — Telephone Encounter (Signed)
LMOM to inform Pt of new medication at pharmacy.

## 2011-06-20 ENCOUNTER — Other Ambulatory Visit: Payer: Self-pay | Admitting: Internal Medicine

## 2011-06-20 ENCOUNTER — Other Ambulatory Visit: Payer: Self-pay

## 2011-06-20 DIAGNOSIS — R7309 Other abnormal glucose: Secondary | ICD-10-CM

## 2011-06-25 ENCOUNTER — Encounter: Payer: Self-pay | Admitting: Internal Medicine

## 2011-06-25 ENCOUNTER — Ambulatory Visit (INDEPENDENT_AMBULATORY_CARE_PROVIDER_SITE_OTHER): Payer: Medicare Other | Admitting: Internal Medicine

## 2011-06-25 DIAGNOSIS — I10 Essential (primary) hypertension: Secondary | ICD-10-CM

## 2011-06-25 DIAGNOSIS — E785 Hyperlipidemia, unspecified: Secondary | ICD-10-CM

## 2011-06-25 DIAGNOSIS — I2510 Atherosclerotic heart disease of native coronary artery without angina pectoris: Secondary | ICD-10-CM

## 2011-06-25 DIAGNOSIS — Z8546 Personal history of malignant neoplasm of prostate: Secondary | ICD-10-CM

## 2011-06-25 DIAGNOSIS — N529 Male erectile dysfunction, unspecified: Secondary | ICD-10-CM

## 2011-06-25 MED ORDER — PRASUGREL HCL 10 MG PO TABS: 10.0000 mg | ORAL_TABLET | Freq: Every day | ORAL | Status: DC

## 2011-06-25 MED ORDER — EZETIMIBE-SIMVASTATIN 10-40 MG PO TABS: 1.0000 | ORAL_TABLET | Freq: Every day | ORAL | Status: DC

## 2011-06-25 MED ORDER — ZOLPIDEM TARTRATE 10 MG PO TABS: 5.0000 mg | ORAL_TABLET | Freq: Every evening | ORAL | Status: DC | PRN

## 2011-06-25 NOTE — Assessment & Plan Note (Signed)
Better w/wt loss  

## 2011-06-25 NOTE — Assessment & Plan Note (Signed)
No angina 

## 2011-06-25 NOTE — Assessment & Plan Note (Signed)
Using a pump, penile injections

## 2011-06-25 NOTE — Progress Notes (Signed)
  Subjective:    Patient ID: Andrew Joseph, male    DOB: 1946-06-04, 65 y.o.   MRN: [id number]  HPI  The patient presents for a follow-up of  chronic hypertension, chronic dyslipidemia, CAD controlled with medicines   Review of Systems  Constitutional: Positive for unexpected weight change (lost on diet). Negative for appetite change and fatigue.  HENT: Negative for nosebleeds, congestion, sore throat, sneezing, trouble swallowing and neck pain.   Eyes: Negative for itching and visual disturbance.  Respiratory: Negative for cough.   Cardiovascular: Negative for chest pain, palpitations and leg swelling.  Gastrointestinal: Negative for nausea, diarrhea, blood in stool and abdominal distention.  Genitourinary: Negative for frequency and hematuria.  Musculoskeletal: Negative for back pain, joint swelling and gait problem.  Skin: Negative for rash.  Neurological: Negative for dizziness, tremors, speech difficulty and weakness.  Psychiatric/Behavioral: Negative for sleep disturbance, dysphoric mood and agitation. The patient is nervous/anxious (ED).    Wt Readings from Last 3 Encounters:  06/25/11 164 lb (74.39 kg)  11/21/10 187 lb (84.823 kg)  06/26/10 185 lb (83.915 kg)       Objective:   Physical Exam  Constitutional: He is oriented to person, place, and time. He appears well-developed.  HENT:  Mouth/Throat: Oropharynx is clear and moist.  Eyes: Conjunctivae are normal. Pupils are equal, round, and reactive to light.  Neck: Normal range of motion. No JVD present. No thyromegaly present.  Cardiovascular: Normal rate, regular rhythm, normal heart sounds and intact distal pulses.  Exam reveals no gallop and no friction rub.   No murmur heard. Pulmonary/Chest: Effort normal and breath sounds normal. No respiratory distress. He has no wheezes. He has no rales. He exhibits no tenderness.  Abdominal: Soft. Bowel sounds are normal. He exhibits no distension and no mass. There is no  tenderness. There is no rebound and no guarding.  Musculoskeletal: Normal range of motion. He exhibits no edema and no tenderness.  Lymphadenopathy:    He has no cervical adenopathy.  Neurological: He is alert and oriented to person, place, and time. He has normal reflexes. No cranial nerve deficit. He exhibits normal muscle tone. Coordination normal.  Skin: Skin is warm and dry. No rash noted.  Psychiatric: He has a normal mood and affect. His behavior is normal. Judgment and thought content normal.          Assessment & Plan:

## 2011-06-25 NOTE — Assessment & Plan Note (Signed)
Labs are ordered 

## 2011-07-12 ENCOUNTER — Emergency Department (HOSPITAL_COMMUNITY): Payer: Medicare Other

## 2011-07-12 ENCOUNTER — Emergency Department (HOSPITAL_COMMUNITY)
Admission: EM | Admit: 2011-07-12 | Discharge: 2011-07-12 | Disposition: A | Discharge status: Home / Self Care | Payer: Medicare Other | Attending: Emergency Medicine | Admitting: Emergency Medicine

## 2011-07-12 DIAGNOSIS — G319 Degenerative disease of nervous system, unspecified: Secondary | ICD-10-CM | POA: Insufficient documentation

## 2011-07-12 DIAGNOSIS — R569 Unspecified convulsions: Secondary | ICD-10-CM | POA: Insufficient documentation

## 2011-07-12 DIAGNOSIS — F29 Unspecified psychosis not due to a substance or known physiological condition: Secondary | ICD-10-CM | POA: Insufficient documentation

## 2011-07-12 DIAGNOSIS — I10 Essential (primary) hypertension: Secondary | ICD-10-CM | POA: Insufficient documentation

## 2011-07-12 LAB — DIFFERENTIAL
Basophils Absolute: 0 10*3/uL (ref 0.0–0.1)
Basophils Relative: 1 % (ref 0–1)
Eosinophils Relative: 2 % (ref 0–5)
Monocytes Absolute: 0.4 10*3/uL (ref 0.1–1.0)
Neutro Abs: 2.8 10*3/uL (ref 1.7–7.7)

## 2011-07-12 LAB — CBC
MCHC: 36.2 g/dL — ABNORMAL HIGH (ref 30.0–36.0)
RDW: 14.2 % (ref 11.5–15.5)
WBC: 4.2 10*3/uL (ref 4.0–10.5)

## 2011-07-12 LAB — URINALYSIS, ROUTINE W REFLEX MICROSCOPIC
Bilirubin Urine: NEGATIVE
Glucose, UA: NEGATIVE mg/dL
Leukocytes, UA: NEGATIVE
Nitrite: NEGATIVE
Specific Gravity, Urine: 1.015 (ref 1.005–1.030)
pH: 6.5 (ref 5.0–8.0)

## 2011-07-12 LAB — CK TOTAL AND CKMB (NOT AT ARMC): Total CK: 190 U/L (ref 7–232)

## 2011-07-12 LAB — COMPREHENSIVE METABOLIC PANEL
ALT: 25 U/L (ref 0–53)
AST: 22 U/L (ref 0–37)
Albumin: 4.2 g/dL (ref 3.5–5.2)
Alkaline Phosphatase: 65 U/L (ref 39–117)
Chloride: 99 mEq/L (ref 96–112)
Potassium: 3.8 mEq/L (ref 3.5–5.1)
Sodium: 135 mEq/L (ref 135–145)
Total Protein: 6.8 g/dL (ref 6.0–8.3)

## 2011-07-12 LAB — TROPONIN I: Troponin I: <0.3 ng/mL (ref <0.30)

## 2011-07-16 ENCOUNTER — Telehealth: Payer: Self-pay | Admitting: Cardiovascular Disease

## 2011-07-16 NOTE — Telephone Encounter (Signed)
Spoke with pt, he was offered an appt with scott weaver pac tomorrow and the pt refused. appt made with dr Eden Emms 07-29-11 at pts request to accommodate his schedule. Andrew Joseph

## 2011-07-16 NOTE — Telephone Encounter (Signed)
Per pt call, [place] Pt said EMS had to take him to the hospital and it was recommended that he see pt cardiologist. Pt wanted to see Dr. Eden Emms ASAP. Please return pt call to advise.

## 2011-07-29 ENCOUNTER — Encounter: Payer: Self-pay | Admitting: *Deleted

## 2011-07-29 ENCOUNTER — Other Ambulatory Visit (INDEPENDENT_AMBULATORY_CARE_PROVIDER_SITE_OTHER): Payer: Medicare Other | Admitting: *Deleted

## 2011-07-29 ENCOUNTER — Ambulatory Visit (INDEPENDENT_AMBULATORY_CARE_PROVIDER_SITE_OTHER): Payer: Medicare Other | Admitting: Cardiovascular Disease

## 2011-07-29 ENCOUNTER — Encounter: Payer: Self-pay | Admitting: Cardiovascular Disease

## 2011-07-29 DIAGNOSIS — F29 Unspecified psychosis not due to a substance or known physiological condition: Secondary | ICD-10-CM

## 2011-07-29 DIAGNOSIS — E785 Hyperlipidemia, unspecified: Secondary | ICD-10-CM

## 2011-07-29 DIAGNOSIS — R0989 Other specified symptoms and signs involving the circulatory and respiratory systems: Secondary | ICD-10-CM

## 2011-07-29 DIAGNOSIS — I2510 Atherosclerotic heart disease of native coronary artery without angina pectoris: Secondary | ICD-10-CM

## 2011-07-29 DIAGNOSIS — I6529 Occlusion and stenosis of unspecified carotid artery: Secondary | ICD-10-CM

## 2011-07-29 DIAGNOSIS — R7309 Other abnormal glucose: Secondary | ICD-10-CM

## 2011-07-29 DIAGNOSIS — I10 Essential (primary) hypertension: Secondary | ICD-10-CM

## 2011-07-29 DIAGNOSIS — R410 Disorientation, unspecified: Secondary | ICD-10-CM

## 2011-07-29 DIAGNOSIS — R55 Syncope and collapse: Secondary | ICD-10-CM

## 2011-07-29 DIAGNOSIS — R569 Unspecified convulsions: Secondary | ICD-10-CM

## 2011-07-29 LAB — BASIC METABOLIC PANEL
CO2: 30 mEq/L (ref 19–32)
Chloride: 103 mEq/L (ref 96–112)
Potassium: 3.7 mEq/L (ref 3.5–5.1)

## 2011-07-29 MED ORDER — PREDNISONE 20 MG PO TABS: ORAL_TABLET | ORAL | Status: DC

## 2011-07-29 MED ORDER — FAMOTIDINE 40 MG PO TABS: 40.0000 mg | ORAL_TABLET | Freq: Every day | ORAL | Status: DC

## 2011-07-29 NOTE — Patient Instructions (Addendum)
Your physician has requested that you have a cardiac catheterization. Cardiac catheterization is used to diagnose and/or treat various heart conditions. Doctors may recommend this procedure for a number of different reasons. The most common reason is to evaluate chest pain. Chest pain can be a symptom of coronary artery disease (CAD), and cardiac catheterization can show whether plaque is narrowing or blocking your heart's arteries. This procedure is also used to evaluate the valves, as well as measure the blood flow and oxygen levels in different parts of your heart. For further information please visit [email or web address] Please follow instruction sheet, as given.   TAKE PREDNISONE 20MG  TAKE 3 TABLETS Tuesday NIGHT AND WED MORNING  TAKE PEPCID 40 MG ONE TABLET Tuesday EVENING AND WED MORNING  TAKE BENADRYL 50MG  Tuesday EVENING AND WED MORNING  MRI OF HEAD W/O CONTRAST FOR SEIZURES  MRA OF NECK WITH CONTRAST FOR CAROTID STENOSIS  REFERRAL TO [place] [facility] FOR SEIZURES

## 2011-07-29 NOTE — Progress Notes (Signed)
Andrew Joseph is seen today S/P DES to the LAD by BB in 2010  He is doing well. He had multiple questions about his activities. I told him I would not use Cialis, not overdue roller coasters and that light aircraft flying was probably ok. He is active with no SSCP, dyspnea, palpitations,  Seen in ER last week for "syncope"   No obvious cardiac etiology.  CT head negative.  He does not believe he had a primary seizure.  Indicates "heart activity" prior to event.  Not sure what this means ? Possibly palpitatons.  NO SSCP.  Bit tongue.  Does not have neuro F/U.  Reviewed angio from 2010 and had 40-50% residual disease distal to stent.  He is very concerned about his heart being the issue.  He cannot walk on a treadmill and said he almost died having a chemical stress test.  He wants to have a cath and given the severity of his episode I think it is reasonable.  Will also order MRI/MRA , EEG and refer to [facility].  ROS: Denies fever, malais, weight loss, blurry vision, decreased visual acuity, cough, sputum, SOB, hemoptysis, pleuritic pain, palpitaitons, heartburn, abdominal pain, melena, lower extremity edema, claudication, or rash.  All other systems reviewed and negative  General: Affect appropriate Healthy:  appears stated age HEENT: normal Neck supple with no adenopathy JVP normal no bruits no thyromegaly Lungs clear with no wheezing and good diaphragmatic motion Heart:  S1/S2 no murmur,rub, gallop or click PMI normal Abdomen: benighn, BS positve, no tenderness, no AAA no bruit.  No HSM or HJR Distal pulses intact with no bruits No edema Neuro non-focal Skin warm and dry No muscular weakness   Current Outpatient Prescriptions  Medication Sig Dispense Refill  . ezetimibe-simvastatin (VYTORIN) 10-40 MG per tablet Take 1 tablet by mouth at bedtime.  90 tablet  3  . fluticasone (FLONASE) 50 MCG/ACT nasal spray 1 spray by Nasal route daily as needed.        . labetalol (NORMODYNE) 300 MG tablet  Take 300 mg by mouth 2 (two) times daily.        [name] loratadine (CLARITIN) 10 MG tablet Take 10 mg by mouth daily as needed. For allergies       . losartan-hydrochlorothiazide (HYZAAR) 100-25 MG per tablet Take 1 tablet by mouth daily.  30 tablet  11  . prasugrel (EFFIENT) 10 MG TABS Take 1 tablet (10 mg total) by mouth daily.  90 tablet  3  . triamcinolone (KENALOG) 0.5 % cream Apply topically 2 (two) times daily.        [name] zolpidem (AMBIEN) 10 MG tablet Take 0.5-1 tablets (5-10 mg total) by mouth at bedtime as needed.  90 tablet  1    Allergies  Iodine   Electrocardiogram:  NSR 81 posterior axis and LAE  Assessment and Plan

## 2011-07-30 DIAGNOSIS — R55 Syncope and collapse: Secondary | ICD-10-CM | POA: Insufficient documentation

## 2011-07-30 DIAGNOSIS — R569 Unspecified convulsions: Secondary | ICD-10-CM | POA: Insufficient documentation

## 2011-07-30 NOTE — Assessment & Plan Note (Signed)
Well controlled.  Continue current medications and low sodium Dash type diet.    

## 2011-07-30 NOTE — Assessment & Plan Note (Signed)
This was working DX in ER.  Needs EEG, MRI/MRA and neuro referral.

## 2011-07-30 NOTE — Assessment & Plan Note (Signed)
R/O CAD or ischemic VT trigger.  Cath.  Premedicate with roids, H2 and antihistamine for iodine allergy

## 2011-07-30 NOTE — Assessment & Plan Note (Signed)
Cholesterol is at goal.  Continue current dose of statin and diet Rx.  No myalgias or side effects.  F/U  LFT's in 6 months. Lab Results  Component Value Date   LDLCALC 116* 10/24/2010

## 2011-07-31 ENCOUNTER — Ambulatory Visit (HOSPITAL_COMMUNITY)
Admission: RE | Admit: 2011-07-31 | Discharge: 2011-07-31 | Disposition: A | Discharge status: Home / Self Care | Payer: Medicare Other | Source: Ambulatory Visit | Attending: Cardiovascular Disease | Admitting: Cardiovascular Disease

## 2011-07-31 DIAGNOSIS — I2510 Atherosclerotic heart disease of native coronary artery without angina pectoris: Secondary | ICD-10-CM

## 2011-07-31 DIAGNOSIS — Z9861 Coronary angioplasty status: Secondary | ICD-10-CM | POA: Insufficient documentation

## 2011-07-31 LAB — CBC
HCT: 42.2 % (ref 39.0–52.0)
MCH: 30.7 pg (ref 26.0–34.0)
MCV: 87 fL (ref 78.0–100.0)
RBC: 4.85 MIL/uL (ref 4.22–5.81)
WBC: 6.2 10*3/uL (ref 4.0–10.5)

## 2011-08-02 ENCOUNTER — Encounter: Payer: Self-pay | Admitting: *Deleted

## 2011-08-06 ENCOUNTER — Inpatient Hospital Stay (HOSPITAL_COMMUNITY): Admission: RE | Admit: 2011-08-06 | Payer: Medicare Other | Source: Ambulatory Visit

## 2011-08-07 ENCOUNTER — Ambulatory Visit (HOSPITAL_COMMUNITY)
Admission: RE | Admit: 2011-08-07 | Discharge: 2011-08-07 | Disposition: A | Discharge status: Home / Self Care | Payer: Medicare Other | Source: Ambulatory Visit | Attending: Cardiovascular Disease | Admitting: Cardiovascular Disease

## 2011-08-07 ENCOUNTER — Telehealth: Payer: Self-pay | Admitting: Cardiovascular Disease

## 2011-08-07 ENCOUNTER — Ambulatory Visit (HOSPITAL_COMMUNITY): Payer: Medicare Other

## 2011-08-07 DIAGNOSIS — G319 Degenerative disease of nervous system, unspecified: Secondary | ICD-10-CM | POA: Insufficient documentation

## 2011-08-07 DIAGNOSIS — R0989 Other specified symptoms and signs involving the circulatory and respiratory systems: Secondary | ICD-10-CM

## 2011-08-07 DIAGNOSIS — R569 Unspecified convulsions: Secondary | ICD-10-CM

## 2011-08-07 DIAGNOSIS — R410 Disorientation, unspecified: Secondary | ICD-10-CM

## 2011-08-07 DIAGNOSIS — I619 Nontraumatic intracerebral hemorrhage, unspecified: Secondary | ICD-10-CM | POA: Insufficient documentation

## 2011-08-07 DIAGNOSIS — I2510 Atherosclerotic heart disease of native coronary artery without angina pectoris: Secondary | ICD-10-CM

## 2011-08-07 DIAGNOSIS — I679 Cerebrovascular disease, unspecified: Secondary | ICD-10-CM | POA: Insufficient documentation

## 2011-08-07 MED ORDER — GADOBENATE DIMEGLUMINE 529 MG/ML IV SOLN
15.0000 mL | Freq: Once | INTRAVENOUS | Status: AC
  Administered 2011-08-07: 15 mL via INTRAVENOUS

## 2011-08-07 NOTE — Telephone Encounter (Signed)
Spoke with Morrie Sheldon, questions answered Deliah Goody

## 2011-08-07 NOTE — Telephone Encounter (Signed)
Patient has appt at 2 pm today. To discuss order

## 2011-08-08 ENCOUNTER — Ambulatory Visit (INDEPENDENT_AMBULATORY_CARE_PROVIDER_SITE_OTHER): Payer: Medicare Other | Admitting: Neurology

## 2011-08-08 ENCOUNTER — Encounter: Payer: Self-pay | Admitting: Neurology

## 2011-08-08 DIAGNOSIS — IMO0002 Reserved for concepts with insufficient information to code with codable children: Secondary | ICD-10-CM

## 2011-08-08 DIAGNOSIS — R55 Syncope and collapse: Secondary | ICD-10-CM

## 2011-08-08 NOTE — Patient Instructions (Addendum)
You have been scheduled for an EEG tomorrow at [facility] at 10:30am.  Please arrive by 10:15am at First floor admitting to check in.  We will call you for a follow up appointment in three months.

## 2011-08-08 NOTE — Progress Notes (Signed)
Dear Dr. Posey Rea,  Thank you having me see Mr. Andrew Joseph in consultation today for his episode of loss of consciousness. On August 10 he had a stressful day in court. In the evening he went to the mall and noted a "kaleidoscope" in his left eye that obscured his vision. This went on for approximately 5-10 minutes. He then  fell suddenly with complete loss of consciousness. Apparently he hit a table, bruised his arm and  back and also possibly bit his tongue. He did not lose control of his bladder.  While he is not able to give the exact time it sounds like it took at least a half an hour for him to awake. The first thing he remembers after the event is being in the emergency room. He denies precipitating palpitations.  His never had one of these events before. He was seen emergently in 2007 for an event of dizziness and headache. At that time he was told he had a "bleeding his brain". He's never has had loss of consciousness.  Past medical history is significant for coronary artery disease. He had a stent placed 10 years ago in his LAD. He also has a history of prostate cancer having had a radical prostatectomy. He's had no head trauma resulting in loss of consciousness although did hit his head on the urinal at the age of 5 requiring stitches.  He's had at 20 episodes of seeing a "kaleidoscope" in his left eye. These are never accompanied by headache. They always last less than an hour. It first occurred when he was 12 when he was hit in the head with a football.  He had a normal delivery and normal development. He never has had a history of meningitis. He never had febrile seizures.  Social history: He is a rare alcohol drinker. He does not smoke. He works as an Research scientist (life sciences).  Family history: His daughter has ophthalmic migraines. He does not have any relatives with seizures.  Allergies: He has allergies to iodine and shellfish.  Medications: Metformin, labetalol, Hyzaar, zolpidem.  Effient  Examination: Filed Vitals:   08/08/11 0941  BP: 120/68  Pulse: 64  Height: 5' 6.5" (1.689 m)  Weight: 167 lb (75.751 kg)   In general he is a well-appearing gentleman in no acute distress.   Cardiovascular: The patient has a regular rate and rhythm and no carotid bruits.  Fundoscopy:  Disks are flat. Vessel caliber within normal limits.  Mental status:   The patient is oriented to person, place and time. Recent and remote memory are intact. Attention span and concentration are normal. Language including repetition, naming, following commands are intact. Fund of knowledge of current and historical events, as well as vocabulary are normal.  Cranial Nerves: Pupils are equally round and reactive to light. Visual fields full to confrontation. Extraocular movements are intact without nystagmus. Facial sensation and muscles of mastication are intact. Muscles of facial expression are symmetric. Hearing intact to bilateral finger rub. Tongue protrusion, uvula, palate midline.  Shoulder shrug intact  Motor:  The patient has normal bulk and tone, no pronator drift and 5/5 strength bilaterally.  There are no adventitious movements.  Reflexes:  Are 1+ bilaterally in both the upper and lower extremities.    Coordination:  Normal finger to nose.  No dysdiadokinesia.  Sensation is intact to temperature and vibration.  Gait and Station are normal.  Tandem gait is intact.  Romberg is negative  I reviewed his CT scan from 2007. There is  a clear 2 cm acute hematoma in his right mesial temporal region. Repeat CT scan in in August 2012 revealed only hypodensity in that area. MRI of the brain was positive for chronic blood products thought to represent a cavernoma. MRA of his head and neck in 2012 was unremarkable.  Impression: Davie Claud is a 65 year old man with a history of a right mesial temporal hemorrhage and likely underlying cavernoma who presents with a spell of loss of  consciousness preceded by visual aura. His neurologic exam is unremarkable. Imaging is remarkable for a right mesial temporal cavernoma. I am uncertain as to the the etiology of this spell. It is possible that this represents a migrainous phenomenon. However the prolonged period of unresponsiveness afterwards does make me think that this may be a seizure. Certainly the position of his cavernoma and their known ictogenic qualities make possible that this visual aura is a seizure phenomenon. I'm going to get an routine EEG. Depending on this result we will have to make a decision about treatment. Given he has had a loss of consciousness I told him that according to the state of [place] he cannot drive for at least six months.  I will see him back in a month. Of course I will let him know the results of his EEG when it is complete.  Lupita Raider Modesto Charon, MD [facility], [place]

## 2011-08-09 ENCOUNTER — Ambulatory Visit (HOSPITAL_COMMUNITY)
Admission: RE | Admit: 2011-08-09 | Discharge: 2011-08-09 | Disposition: A | Discharge status: Home / Self Care | Payer: Medicare Other | Source: Ambulatory Visit | Attending: Neurology | Admitting: Neurology

## 2011-08-09 DIAGNOSIS — IMO0002 Reserved for concepts with insufficient information to code with codable children: Secondary | ICD-10-CM

## 2011-08-09 DIAGNOSIS — R569 Unspecified convulsions: Secondary | ICD-10-CM

## 2011-08-09 DIAGNOSIS — R404 Transient alteration of awareness: Secondary | ICD-10-CM | POA: Insufficient documentation

## 2011-08-09 DIAGNOSIS — Z1389 Encounter for screening for other disorder: Secondary | ICD-10-CM | POA: Insufficient documentation

## 2011-08-10 NOTE — Procedures (Signed)
EEG NUMBER:  [id number].  This routine EEG was requested in this 65 year old man with a history of spell of loss of consciousness.  Purpose of this EEG is to look for interictal epileptiform discharges that may suggest a diagnosis of seizure.  He is on no anticonvulsant medications.  The EEG was done with the patient awake and drowsy.  During periods of maximal wakefulness, he had a 9-10 cycle per second posterior dominant rhythm that attenuates with eye opening and was symmetric.  It is moderately regulated and moderately sustained.  Background activity is characterized by low-amplitude alpha and beta activities that were symmetric.  Photic stimulation produced a symmetric driving response.  Hyperventilation for 3 minutes with good effort did not markedly change the tracing.  The patient became drowsy as evidenced by bursts of symmetric theta activities.  Stage 2 sleep was not reached.  CLINICAL INTERPRETATION:  This routine EEG done with the patient awake and drowsy is normal.          ______________________________ Denton Meek, MD    ZO:XWRU D:  08/09/2011 18:04:25  T:  08/10/2011 02:43:50  Job #:  [id number]

## 2011-08-12 NOTE — Cardiovascular Report (Signed)
NAME:  Andrew Joseph, Andrew Joseph NO.:  [id number]  MEDICAL RECORD NO.:  [id number]  LOCATION:  [facility]                         FACILITY:  [facility]  PHYSICIAN:  Noralyn Pick. Eden Emms, MD, FACCDATE OF BIRTH:  [birth year]  DATE OF PROCEDURE: DATE OF DISCHARGE:                           CARDIAC CATHETERIZATION   A 65 year old patient with history of a distal LAD stent in 2010 by Dr. Juanda Chance.  The patient had a recent seizure episode.  It was not 100% clear as to whether or not a component of cardiac syncope involved. Study was done to rule out restenosis.  Cine catheterization was done with 5-French sheath from right radial artery.  The patient received fentanyl and Versed for sedation.  He received 4000 units of heparin and 3 mg of verapamil.  Standard JL-4, JR-4 catheters were used to engage coronaries.  Left main coronary artery was a very short segment.  There was near side- by-side separate ostia.  No significant disease.  The proximal LAD had 30% to 40% tubular disease.  The mid LAD had 50% multiple discrete lesions.  This was unchanged from the previous catheterization done in 2010.  There was a widely patent stent in the distal LAD.  The LAD was a large artery that wrapped the apex.  The LAD actually supplied most of the PDA territory, so it was difficult to know what dominance the patient was.  Right coronary artery was small and had no significant disease.  The circumflex coronary artery was somewhat larger than right coronary artery, but again the PDA was mostly supplied by the distal LAD.  There was 20% to 30% ostial lesions proximally.  The ostium of the first obtuse marginal branch had a 40% lesion.  The patient also had a very large diagonal branch, which did not have significant disease and provided most of the anterolateral wall.  Left ventriculography.  Left ventriculography was normal.  EF was 60%. There was no gradient across the aortic valve and no MR.   Aortic pressure was 132/71.  LV pressure was somewhat inaccurate with the LV tracing at 160/11.  IMPRESSION:  The patient has continued moderate disease in the mid LAD after the diagonal takeoff.  I closely reviewed his previous film from 2010 and there has been no change in this disease.  His stent is widely patent and he does not have any new disease in the right coronary artery or circ.  Clinically when I talked to the patient it sounded like he had a seizure.  There is no antecedent chest pain, palpitations, or cardiac symptoms.  The patient was fairly insistent on having a diagnostic cath rather than a stress test.  I have encouraged the patient to follow up with Neurology.  We have already arranged for him to have an EEG, MRI with MRA, and neurological followup.  Given the patency of the stent, normal LV function would be highly unlikely that his "seizure event was cardiac in etiology."  At the end of the case, a TR band was placed with good hemostasis.  He tolerated the procedure well.     Noralyn Pick. Eden Emms, MD, [facility]     PCN/MEDQ  D:  07/31/2011  T:  07/31/2011  Job:  [id number]  Electronically Signed by [name] MD [facility] on 08/12/2011 10:10:39 AM

## 2011-08-22 LAB — HEMOGLOBIN AND HEMATOCRIT, BLOOD
HCT: 38.7 — ABNORMAL LOW
HCT: 42.9

## 2011-08-22 LAB — BASIC METABOLIC PANEL
CO2: 25
CO2: 26
CO2: 30
Calcium: 7.8 — ABNORMAL LOW
Calcium: 8.1 — ABNORMAL LOW
Calcium: 9.7
Chloride: 104
GFR calc Af Amer: 40 — ABNORMAL LOW
GFR calc Af Amer: >60
Glucose, Bld: 121 — ABNORMAL HIGH
Potassium: 3.7
Potassium: 3.9
Potassium: 4.4
Sodium: 133 — ABNORMAL LOW
Sodium: 134 — ABNORMAL LOW
Sodium: 140

## 2011-08-22 LAB — CBC
Hemoglobin: 12.5 — ABNORMAL LOW
MCHC: 34.6
RBC: 4.15 — ABNORMAL LOW

## 2011-08-22 LAB — TYPE AND SCREEN
ABO/RH(D): A POS
Antibody Screen: NEGATIVE

## 2011-08-22 LAB — ABO/RH: ABO/RH(D): A POS

## 2011-08-23 LAB — BASIC METABOLIC PANEL
Calcium: 8.3 — ABNORMAL LOW
Calcium: 8.4
Creatinine, Ser: 2.01 — ABNORMAL HIGH
GFR calc Af Amer: 41 — ABNORMAL LOW
GFR calc Af Amer: >60
GFR calc non Af Amer: >60
Sodium: 137
Sodium: 138

## 2011-09-11 ENCOUNTER — Other Ambulatory Visit: Payer: Self-pay | Admitting: *Deleted

## 2011-09-11 MED ORDER — LABETALOL HCL 300 MG PO TABS: 300.0000 mg | ORAL_TABLET | Freq: Two times a day (BID) | ORAL | Status: DC

## 2011-10-16 ENCOUNTER — Ambulatory Visit: Payer: Medicare Other | Admitting: Internal Medicine

## 2011-10-16 DIAGNOSIS — Z0289 Encounter for other administrative examinations: Secondary | ICD-10-CM

## 2012-01-28 ENCOUNTER — Telehealth: Payer: Self-pay | Admitting: Internal Medicine

## 2012-01-28 NOTE — Telephone Encounter (Signed)
OK to fill this prescription with additional refills x3 Thank you!  

## 2012-01-28 NOTE — Telephone Encounter (Signed)
Pt req ZOLPIDEM 10 mg tab, take 05-1 tab by mouth at bedtime as needed. Last refill 06/25/2011. Ok to refill please?

## 2012-01-30 MED ORDER — ZOLPIDEM TARTRATE 10 MG PO TABS: 5.0000 mg | ORAL_TABLET | Freq: Every evening | ORAL | Status: DC | PRN

## 2012-01-30 NOTE — Telephone Encounter (Signed)
Refill called to [facility] x no refills.

## 2012-05-26 ENCOUNTER — Other Ambulatory Visit: Payer: Self-pay

## 2012-05-26 MED ORDER — LOSARTAN POTASSIUM-HCTZ 100-25 MG PO TABS: 1.0000 | ORAL_TABLET | Freq: Every day | ORAL | Status: DC

## 2012-06-08 ENCOUNTER — Telehealth: Payer: Self-pay | Admitting: *Deleted

## 2012-06-08 MED ORDER — ZOLPIDEM TARTRATE 10 MG PO TABS: 5.0000 mg | ORAL_TABLET | Freq: Every evening | ORAL | Status: DC | PRN

## 2012-06-08 NOTE — Telephone Encounter (Signed)
Done

## 2012-06-08 NOTE — Telephone Encounter (Signed)
Rf req for zolpidem 10 mg. Ok to Rf?

## 2012-06-08 NOTE — Telephone Encounter (Signed)
OK to fill this prescription with additional refills x3 Thank you!  

## 2012-06-26 ENCOUNTER — Telehealth: Payer: Self-pay | Admitting: Internal Medicine

## 2012-06-26 ENCOUNTER — Other Ambulatory Visit: Payer: Self-pay | Admitting: *Deleted

## 2012-06-26 MED ORDER — LOSARTAN POTASSIUM-HCTZ 100-25 MG PO TABS: 1.0000 | ORAL_TABLET | Freq: Every day | ORAL | Status: DC

## 2012-06-26 NOTE — Telephone Encounter (Signed)
Message copied by Newell Coral on Fri Jun 26, 2012 11:34 AM ------      Message from: Merrilyn Puma      Created: Fri Jun 26, 2012  8:55 AM       Pt needs OV ASAP for med refills. Last OV was 06/2011. Im only filling 30 days of BP med. Thanks!!

## 2012-06-26 NOTE — Telephone Encounter (Signed)
Lvmom for pt to call back to schedule follow up apt.

## 2012-07-14 ENCOUNTER — Ambulatory Visit (INDEPENDENT_AMBULATORY_CARE_PROVIDER_SITE_OTHER): Payer: Medicare Other | Admitting: Internal Medicine

## 2012-07-14 ENCOUNTER — Encounter: Payer: Self-pay | Admitting: Internal Medicine

## 2012-07-14 VITALS — BP 128/70 | HR 61 | Temp 98.3°F | Ht 65.0 in | Wt 176.0 lb

## 2012-07-14 DIAGNOSIS — N529 Male erectile dysfunction, unspecified: Secondary | ICD-10-CM

## 2012-07-14 DIAGNOSIS — I2510 Atherosclerotic heart disease of native coronary artery without angina pectoris: Secondary | ICD-10-CM

## 2012-07-14 DIAGNOSIS — Z8546 Personal history of malignant neoplasm of prostate: Secondary | ICD-10-CM

## 2012-07-14 DIAGNOSIS — I10 Essential (primary) hypertension: Secondary | ICD-10-CM

## 2012-07-14 DIAGNOSIS — R7309 Other abnormal glucose: Secondary | ICD-10-CM

## 2012-07-14 DIAGNOSIS — G4700 Insomnia, unspecified: Secondary | ICD-10-CM

## 2012-07-14 DIAGNOSIS — L509 Urticaria, unspecified: Secondary | ICD-10-CM

## 2012-07-14 DIAGNOSIS — F411 Generalized anxiety disorder: Secondary | ICD-10-CM

## 2012-07-14 DIAGNOSIS — R739 Hyperglycemia, unspecified: Secondary | ICD-10-CM

## 2012-07-14 DIAGNOSIS — E785 Hyperlipidemia, unspecified: Secondary | ICD-10-CM

## 2012-07-14 MED ORDER — EZETIMIBE-SIMVASTATIN 10-40 MG PO TABS: 1.0000 | ORAL_TABLET | Freq: Every day | ORAL | Status: DC

## 2012-07-14 MED ORDER — PRASUGREL HCL 10 MG PO TABS: 10.0000 mg | ORAL_TABLET | Freq: Every day | ORAL | Status: DC

## 2012-07-14 MED ORDER — ZOLPIDEM TARTRATE 10 MG PO TABS: 10.0000 mg | ORAL_TABLET | Freq: Every evening | ORAL | Status: DC | PRN

## 2012-07-14 MED ORDER — LABETALOL HCL 300 MG PO TABS: 300.0000 mg | ORAL_TABLET | Freq: Two times a day (BID) | ORAL | Status: DC

## 2012-07-14 MED ORDER — LOSARTAN POTASSIUM-HCTZ 100-25 MG PO TABS: 1.0000 | ORAL_TABLET | Freq: Every day | ORAL | Status: DC

## 2012-07-14 MED ORDER — TRIAMCINOLONE ACETONIDE 0.5 % EX CREA: TOPICAL_CREAM | Freq: Two times a day (BID) | CUTANEOUS | Status: DC

## 2012-07-14 NOTE — Progress Notes (Signed)
   Subjective:    Patient ID: Andrew Joseph, male    DOB: December 11, 1945, 66 y.o.   MRN: [id number]  HPI  The patient presents for a follow-up of  chronic hypertension, chronic dyslipidemia, CAD controlled with medicines   Review of Systems  Constitutional: Positive for unexpected weight change (lost on diet). Negative for appetite change and fatigue.  HENT: Negative for nosebleeds, congestion, sore throat, sneezing, trouble swallowing and neck pain.   Eyes: Negative for itching and visual disturbance.  Respiratory: Negative for cough.   Cardiovascular: Negative for chest pain, palpitations and leg swelling.  Gastrointestinal: Negative for nausea, diarrhea, blood in stool and abdominal distention.  Genitourinary: Negative for frequency and hematuria.  Musculoskeletal: Negative for back pain, joint swelling and gait problem.  Skin: Negative for rash.  Neurological: Negative for dizziness, tremors, speech difficulty and weakness.  Psychiatric/Behavioral: Negative for disturbed wake/sleep cycle, dysphoric mood and agitation. The patient is nervous/anxious (ED).    Wt Readings from Last 3 Encounters:  07/14/12 176 lb (79.833 kg)  08/08/11 167 lb (75.751 kg)  07/29/11 167 lb (75.751 kg)   BP Readings from Last 3 Encounters:  07/14/12 128/70  08/08/11 120/68  07/29/11 123/67       Objective:   Physical Exam  Constitutional: He is oriented to person, place, and time. He appears well-developed.  HENT:  Mouth/Throat: Oropharynx is clear and moist.  Eyes: Conjunctivae are normal. Pupils are equal, round, and reactive to light.  Neck: Normal range of motion. No JVD present. No thyromegaly present.  Cardiovascular: Normal rate, regular rhythm, normal heart sounds and intact distal pulses.  Exam reveals no gallop and no friction rub.   No murmur heard. Pulmonary/Chest: Effort normal and breath sounds normal. No respiratory distress. He has no wheezes. He has no rales. He exhibits no  tenderness.  Abdominal: Soft. Bowel sounds are normal. He exhibits no distension and no mass. There is no tenderness. There is no rebound and no guarding.  Musculoskeletal: Normal range of motion. He exhibits no edema and no tenderness.  Lymphadenopathy:    He has no cervical adenopathy.  Neurological: He is alert and oriented to person, place, and time. He has normal reflexes. No cranial nerve deficit. He exhibits normal muscle tone. Coordination normal.  Skin: Skin is warm and dry. No rash noted.  Psychiatric: He has a normal mood and affect. His behavior is normal. Judgment and thought content normal.   Lab Results  Component Value Date   WBC 6.2 07/31/2011   HGB 14.9 07/31/2011   HCT 42.2 07/31/2011   PLT 155 07/31/2011   GLUCOSE 100* 07/29/2011   CHOL 171 10/24/2010   TRIG 75.0 10/24/2010   HDL 39.90 10/24/2010   LDLDIRECT 250.3 11/16/2008   LDLCALC 116* 10/24/2010   ALT 25 07/12/2011   AST 22 07/12/2011   NA 140 07/29/2011   K 3.7 07/29/2011   CL 103 07/29/2011   CREATININE 1.0 07/29/2011   BUN 18 07/29/2011   CO2 30 07/29/2011   TSH 1.05 09/05/2009   INR 0.99 07/31/2011   HGBA1C 5.6 12/18/2009          Assessment & Plan:

## 2012-07-14 NOTE — Assessment & Plan Note (Signed)
Continue with current prescription therapy as reflected on the Med list.  

## 2012-07-14 NOTE — Assessment & Plan Note (Signed)
S/p prostatectomy Dr Annabell Howells

## 2012-07-14 NOTE — Assessment & Plan Note (Signed)
Doing well 

## 2012-07-17 ENCOUNTER — Other Ambulatory Visit (INDEPENDENT_AMBULATORY_CARE_PROVIDER_SITE_OTHER): Payer: Medicare Other

## 2012-07-17 DIAGNOSIS — N529 Male erectile dysfunction, unspecified: Secondary | ICD-10-CM

## 2012-07-17 DIAGNOSIS — Z8546 Personal history of malignant neoplasm of prostate: Secondary | ICD-10-CM

## 2012-07-17 DIAGNOSIS — L509 Urticaria, unspecified: Secondary | ICD-10-CM

## 2012-07-17 DIAGNOSIS — G4700 Insomnia, unspecified: Secondary | ICD-10-CM

## 2012-07-17 DIAGNOSIS — F411 Generalized anxiety disorder: Secondary | ICD-10-CM

## 2012-07-17 DIAGNOSIS — R7309 Other abnormal glucose: Secondary | ICD-10-CM

## 2012-07-17 DIAGNOSIS — I10 Essential (primary) hypertension: Secondary | ICD-10-CM

## 2012-07-17 DIAGNOSIS — I2510 Atherosclerotic heart disease of native coronary artery without angina pectoris: Secondary | ICD-10-CM

## 2012-07-17 DIAGNOSIS — E785 Hyperlipidemia, unspecified: Secondary | ICD-10-CM

## 2012-07-17 DIAGNOSIS — R739 Hyperglycemia, unspecified: Secondary | ICD-10-CM

## 2012-07-17 LAB — HEPATIC FUNCTION PANEL
Albumin: 4 g/dL (ref 3.5–5.2)
Bilirubin, Direct: 0.1 mg/dL (ref 0.0–0.3)
Total Protein: 6.4 g/dL (ref 6.0–8.3)

## 2012-07-17 LAB — CBC WITH DIFFERENTIAL/PLATELET
Basophils Relative: 0.4 % (ref 0.0–3.0)
Eosinophils Relative: 3.8 % (ref 0.0–5.0)
HCT: 43 % (ref 39.0–52.0)
Hemoglobin: 14.4 g/dL (ref 13.0–17.0)
Lymphs Abs: 0.8 10*3/uL (ref 0.7–4.0)
Monocytes Relative: 8.6 % (ref 3.0–12.0)
Neutro Abs: 2.1 10*3/uL (ref 1.4–7.7)
Platelets: 119 10*3/uL — ABNORMAL LOW (ref 150.0–400.0)
RBC: 4.69 Mil/uL (ref 4.22–5.81)
WBC: 3.4 10*3/uL — ABNORMAL LOW (ref 4.5–10.5)

## 2012-07-17 LAB — BASIC METABOLIC PANEL
GFR: 78.26 mL/min (ref >60.00)
Potassium: 4.5 mEq/L (ref 3.5–5.1)
Sodium: 141 mEq/L (ref 135–145)

## 2012-07-17 LAB — LIPID PANEL
Cholesterol: 168 mg/dL (ref 0–200)
LDL Cholesterol: 105 mg/dL — ABNORMAL HIGH (ref 0–99)
VLDL: 18.2 mg/dL (ref 0.0–40.0)

## 2012-07-17 LAB — URINALYSIS
Hgb urine dipstick: NEGATIVE
Nitrite: NEGATIVE
Total Protein, Urine: NEGATIVE
Urobilinogen, UA: 0.2 (ref 0.0–1.0)

## 2012-07-17 LAB — HEMOGLOBIN A1C: Hgb A1c MFr Bld: 5.4 % (ref 4.6–6.5)

## 2012-07-22 LAB — ALLERGEN FOOD PROFILE SPECIFIC IGE
Apple: <0.1 kU/L
Chicken IgE: <0.1 kU/L
Egg White IgE: <0.1 kU/L
Orange: 0.12 kU/L — ABNORMAL HIGH
Shrimp IgE: 1.78 kU/L — ABNORMAL HIGH
Tomato IgE: 0.17 kU/L — ABNORMAL HIGH

## 2013-01-19 ENCOUNTER — Encounter: Payer: Medicare Other | Admitting: Internal Medicine

## 2013-01-19 DIAGNOSIS — Z0289 Encounter for other administrative examinations: Secondary | ICD-10-CM

## 2013-03-04 ENCOUNTER — Other Ambulatory Visit: Payer: Self-pay | Admitting: Internal Medicine

## 2013-03-14 IMAGING — CT CT HEAD W/O CM
1 of 2 series · 16 of 30 positions shown, 20 images · non-contrast
Comparison: 06/05/2006

CLINICAL DATA: Chest pain, seizure, and syncope.  The patient fell.

CT HEAD WITHOUT CONTRAST
TECHNIQUE: Contiguous axial images were obtained from the base of
the skull through the vertex without contrast.

[Series 3: recon 2: brain · axial · 0.49mm/px · z∈[+130,+277]mm · 16 of 64 slices shown, 20 images]
[im 4/64  brain]
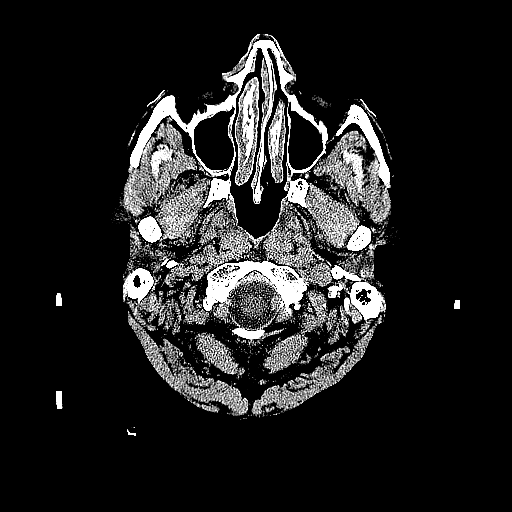
[im 4/64  bone]
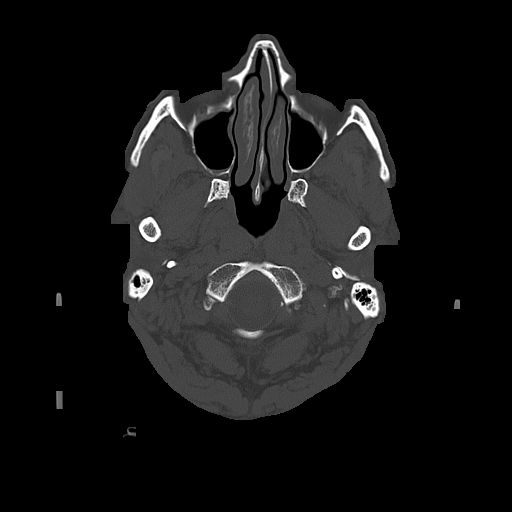
[im 7/64  brain]
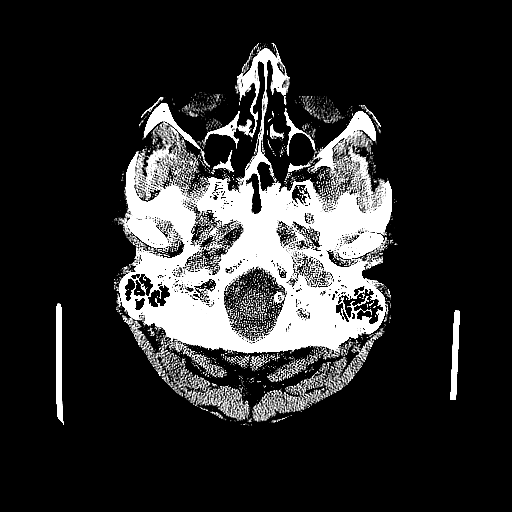
[im 10/64  brain]
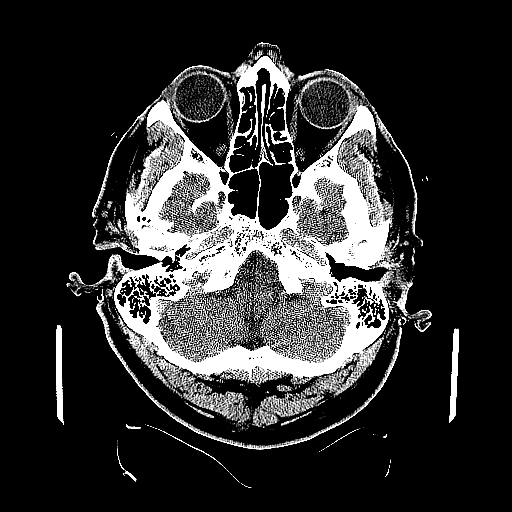
[im 14/64  brain]
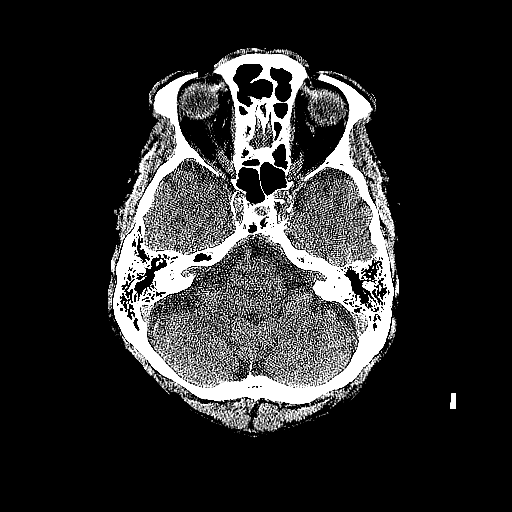
[im 20/64  brain]
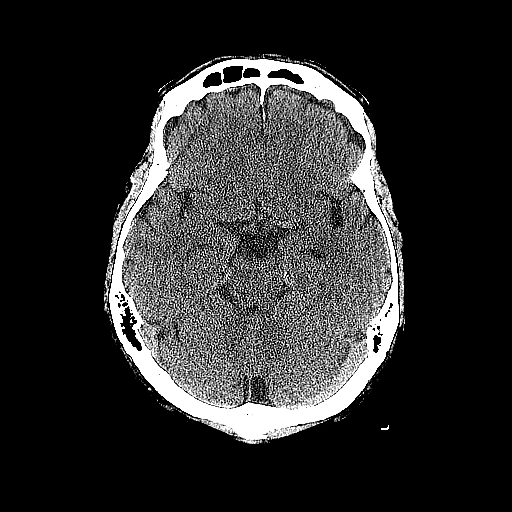
[im 20/64  bone]
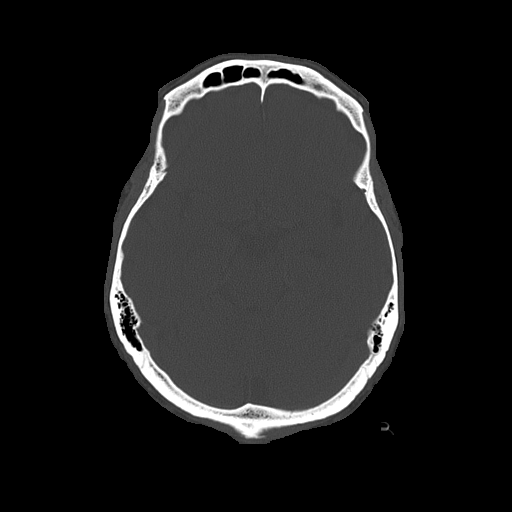
[im 24/64  brain]
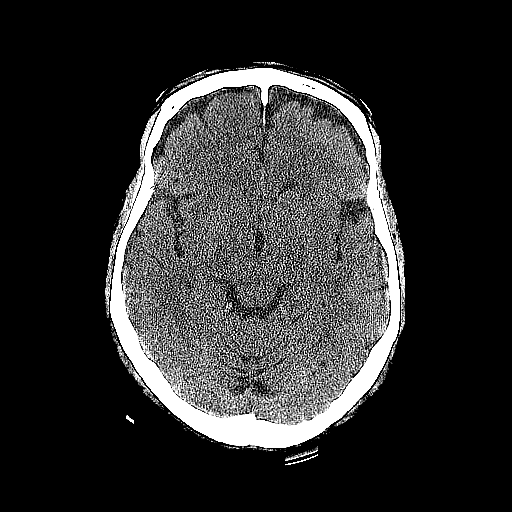
[im 27/64  brain]
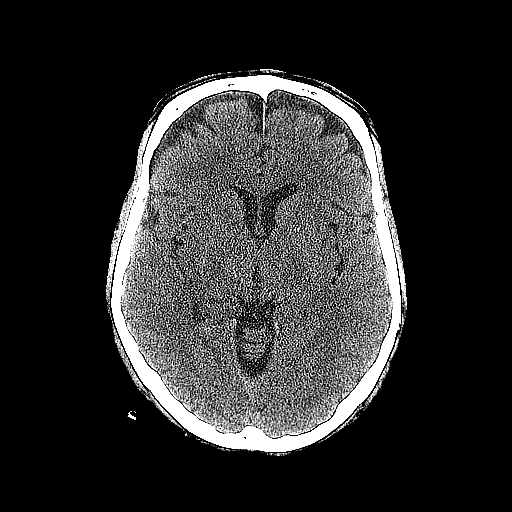
[im 30/64  brain]
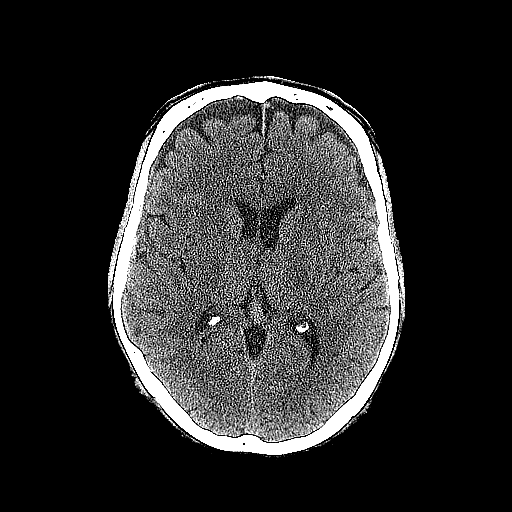
[im 34/64  brain]
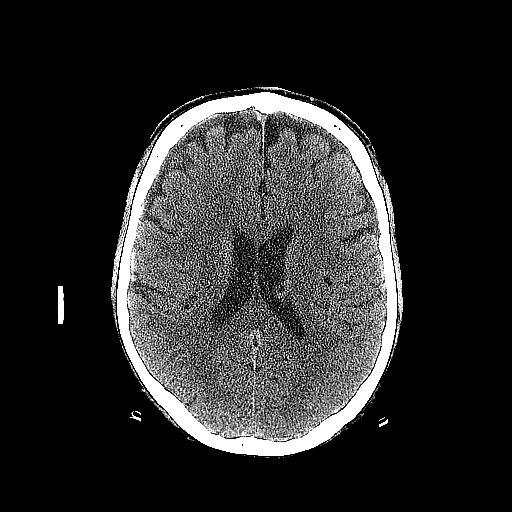
[im 34/64  bone]
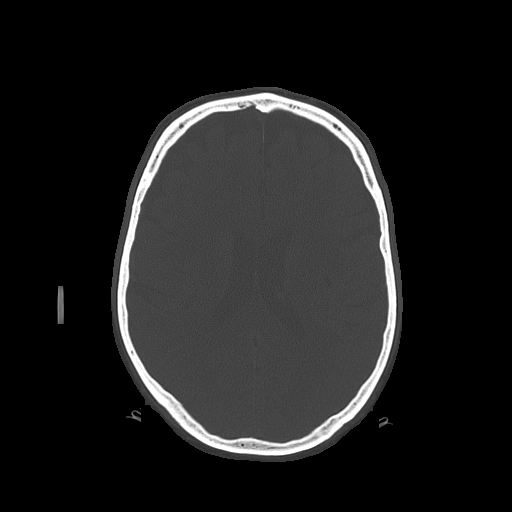
[im 37/64  brain]
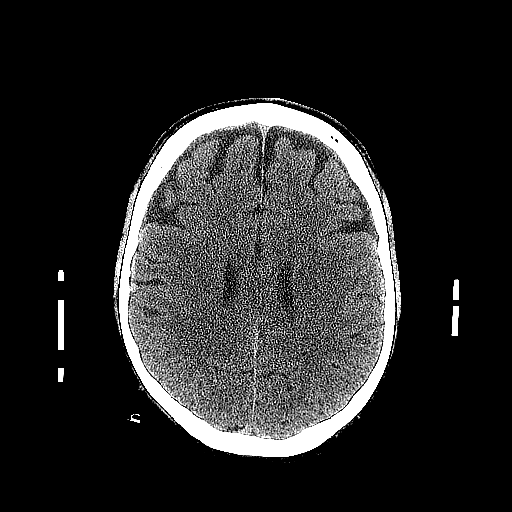
[im 40/64  brain]
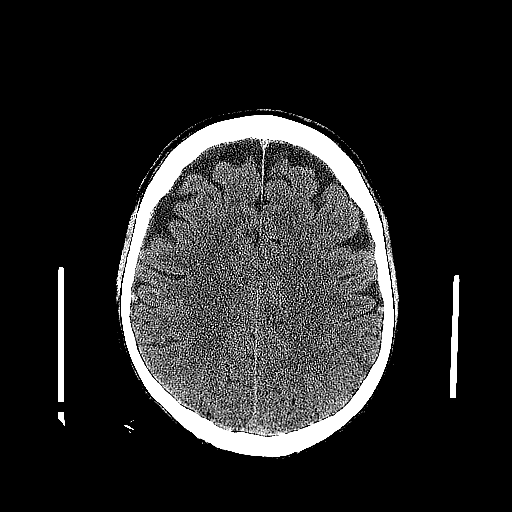
[im 44/64  brain]
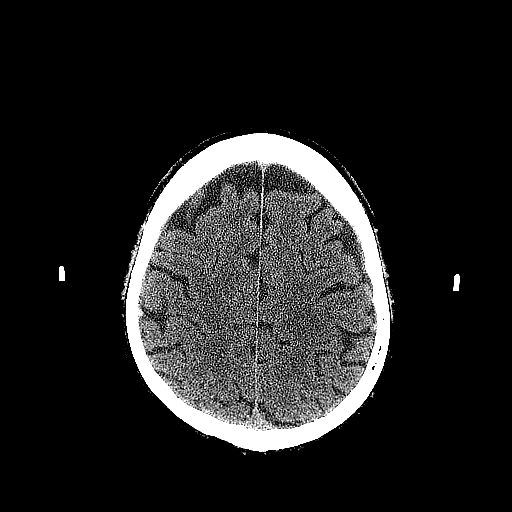
[im 50/64  brain]
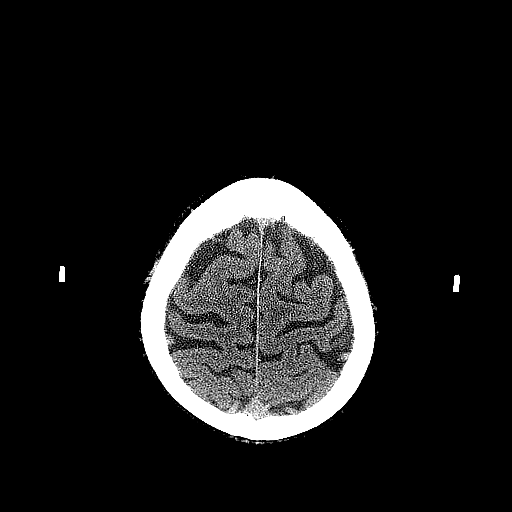
[im 50/64  bone]
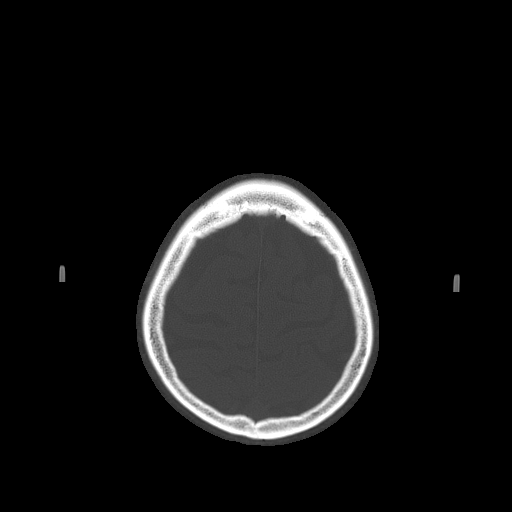
[im 54/64  brain]
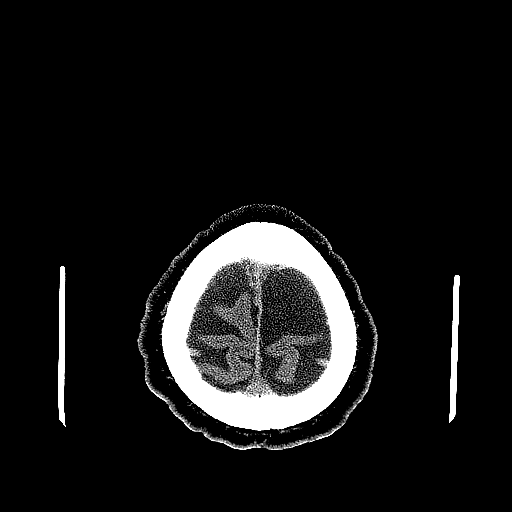
[im 57/64  brain]
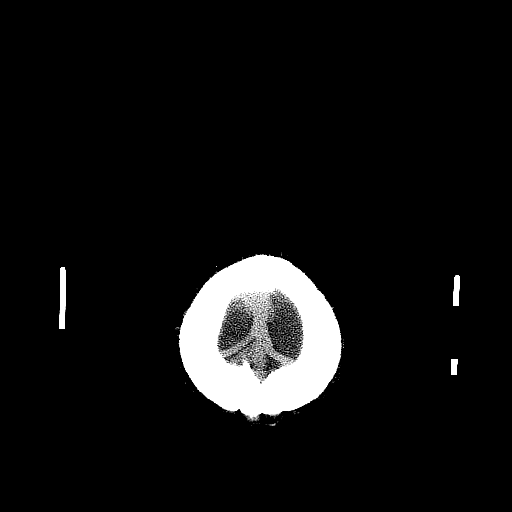
[im 60/64  brain]
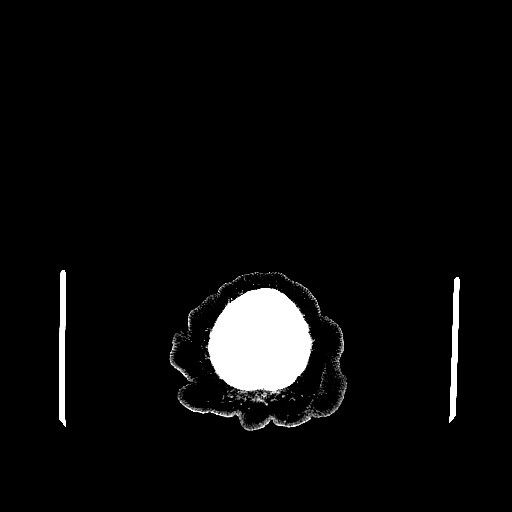

[16 of 30 positions shown; findings below may reference images not displayed]

FINDINGS: Diffuse cerebral atrophy.  Low attenuation change in the
deep white matter consistent small vessel ischemia.  No mass effect
or midline shift.  No abnormal extra-axial fluid collections.  The
gray-white matter junctions appear distinct.  The basal cisterns
are not effaced.  No evidence of acute intracranial hemorrhage.
Vascular calcifications in the deep white matter.  No depressed
skull fractures.  Visualized portions of the paranasal sinuses are
not opacified.
IMPRESSION: Diffuse atrophic and small vessel ischemic change.  No evidence of
acute intracranial hemorrhage, mass lesion, or acute infarct.

## 2013-03-21 ENCOUNTER — Emergency Department (HOSPITAL_COMMUNITY)
Admission: EM | Admit: 2013-03-21 | Discharge: 2013-03-21 | Disposition: A | Discharge status: Home / Self Care | Payer: Medicare Other | Attending: Emergency Medicine | Admitting: Emergency Medicine

## 2013-03-21 DIAGNOSIS — Z8639 Personal history of other endocrine, nutritional and metabolic disease: Secondary | ICD-10-CM | POA: Insufficient documentation

## 2013-03-21 DIAGNOSIS — Z8673 Personal history of transient ischemic attack (TIA), and cerebral infarction without residual deficits: Secondary | ICD-10-CM | POA: Insufficient documentation

## 2013-03-21 DIAGNOSIS — I2510 Atherosclerotic heart disease of native coronary artery without angina pectoris: Secondary | ICD-10-CM | POA: Insufficient documentation

## 2013-03-21 DIAGNOSIS — Z7982 Long term (current) use of aspirin: Secondary | ICD-10-CM | POA: Insufficient documentation

## 2013-03-21 DIAGNOSIS — W268XXA Contact with other sharp object(s), not elsewhere classified, initial encounter: Secondary | ICD-10-CM | POA: Insufficient documentation

## 2013-03-21 DIAGNOSIS — S61209A Unspecified open wound of unspecified finger without damage to nail, initial encounter: Secondary | ICD-10-CM | POA: Insufficient documentation

## 2013-03-21 DIAGNOSIS — Z8546 Personal history of malignant neoplasm of prostate: Secondary | ICD-10-CM | POA: Insufficient documentation

## 2013-03-21 DIAGNOSIS — Z87448 Personal history of other diseases of urinary system: Secondary | ICD-10-CM | POA: Insufficient documentation

## 2013-03-21 DIAGNOSIS — Z23 Encounter for immunization: Secondary | ICD-10-CM | POA: Insufficient documentation

## 2013-03-21 DIAGNOSIS — I10 Essential (primary) hypertension: Secondary | ICD-10-CM | POA: Insufficient documentation

## 2013-03-21 DIAGNOSIS — Z79899 Other long term (current) drug therapy: Secondary | ICD-10-CM | POA: Insufficient documentation

## 2013-03-21 DIAGNOSIS — F411 Generalized anxiety disorder: Secondary | ICD-10-CM | POA: Insufficient documentation

## 2013-03-21 DIAGNOSIS — Z85828 Personal history of other malignant neoplasm of skin: Secondary | ICD-10-CM | POA: Insufficient documentation

## 2013-03-21 DIAGNOSIS — Y939 Activity, unspecified: Secondary | ICD-10-CM | POA: Insufficient documentation

## 2013-03-21 DIAGNOSIS — S61219A Laceration without foreign body of unspecified finger without damage to nail, initial encounter: Secondary | ICD-10-CM

## 2013-03-21 DIAGNOSIS — Z862 Personal history of diseases of the blood and blood-forming organs and certain disorders involving the immune mechanism: Secondary | ICD-10-CM | POA: Insufficient documentation

## 2013-03-21 DIAGNOSIS — Y929 Unspecified place or not applicable: Secondary | ICD-10-CM | POA: Insufficient documentation

## 2013-03-21 MED ORDER — LIDOCAINE-EPINEPHRINE 2 %-1:100000 IJ SOLN: 20.0000 mL | Freq: Once | INTRAMUSCULAR | Status: DC

## 2013-03-21 MED ORDER — TETANUS-DIPHTH-ACELL PERTUSSIS 5-2.5-18.5 LF-MCG/0.5 IM SUSP
0.5000 mL | Freq: Once | INTRAMUSCULAR | Status: AC
  Administered 2013-03-21: 0.5 mL via INTRAMUSCULAR
  Filled 2013-03-21: qty 0.5

## 2013-03-21 MED ORDER — CEPHALEXIN 500 MG PO CAPS: 500.0000 mg | ORAL_CAPSULE | Freq: Two times a day (BID) | ORAL | Status: DC

## 2013-03-21 NOTE — ED Provider Notes (Signed)
History    This chart was scribed for a non-physician practitioner, Wynetta Emery, working with Glynn Octave, MD by Frederik Pear, ED Scribe. This patient was seen in room WTR6/WTR6 and the patient's care was started at 1817.   CSN: [id number]  Arrival date & time 03/21/13  1721   First MD Initiated Contact with Patient 03/21/13 1817      Chief Complaint  Patient presents with  . Finger Injury    (Consider location/radiation/quality/duration/timing/severity/associated sxs/prior treatment) The history is provided by the patient and medical records. No language interpreter was used.    Andrew Joseph is a 67 y.o. male who presents to the Emergency Department complaining of sudden onset, constant laceration to the left index finger that is neither alleviated or aggravated by anything that began at 1700 when he cut his hand while using a brand new 20 inch collapsible hand saw to cut a piece of cardboard. He rates the current pain as 0/10. He reports that he poured hydrogen peroxide over the laceration and applied a band aid at home. He has no h/o of DM. His last tetanus shot was more than 10 years ago.  PCP is Dr. Posey Rea.  Past Medical History  Diagnosis Date  . CAD (coronary artery disease)     DES mid lad 08/15/2009  . Chest pain, unspecified   . Hypokalemia   . BPH (benign prostatic hyperplasia)   . HTN (hypertension)   . Hyperlipidemia   . Anxiety   . Hypercholesterolemia   . Insomnia, persistent   . Neoplasm of uncertain behavior of skin   . Blood in stool   . Allergic rhinitis, cause unspecified   . History of prostate cancer 2009    Dr Annabell Howells  . CVA (cerebrovascular accident due to intracerebral hemorrhage) 2007 Spring    Past Surgical History  Procedure Laterality Date  . Prostatectomy  2009  . Tonsillectomy      Family History  Problem Relation Age of Onset  . Stroke Mother   . Heart disease Father     MI  . Diabetes Brother     Obese    History   Substance Use Topics  . Smoking status: Never Smoker   . Smokeless tobacco: Never Used  . Alcohol Use: Yes     Comment: Occasional glass of wine     Review of Systems  Constitutional: Negative for fever.  Respiratory: Negative for shortness of breath.   Cardiovascular: Negative for chest pain.  Gastrointestinal: Negative for nausea, vomiting, abdominal pain and diarrhea.  Skin: Positive for wound (laceration).  All other systems reviewed and are negative.    Allergies  Iodine and Shellfish allergy  Home Medications   Current Outpatient Rx  Name  Route  Sig  Dispense  Refill  . aspirin 325 MG tablet   Oral   Take 325 mg by mouth daily.         [name] ezetimibe-simvastatin (VYTORIN) 10-40 MG per tablet   Oral   Take 1 tablet by mouth at bedtime.   90 tablet   3   . labetalol (NORMODYNE) 300 MG tablet   Oral   Take 1 tablet (300 mg total) by mouth 2 (two) times daily.   180 tablet   3   . losartan-hydrochlorothiazide (HYZAAR) 100-25 MG per tablet   Oral   Take 1 tablet by mouth daily.   90 tablet   3     No future refills until seen by PCP.   [name]  Melatonin-Pyridoxine (MELATIN PO)   Oral   Take 3-10 mg by mouth at bedtime as needed (sleep).         . zolpidem (AMBIEN) 10 MG tablet   Oral   Take 5 mg by mouth at bedtime as needed for sleep.           BP 144/74  Pulse 62  Temp(Src) 98 F (36.7 C) (Oral)  Resp 22  SpO2 99%  Physical Exam  Nursing note and vitals reviewed. Constitutional: He is oriented to person, place, and time. He appears well-developed and well-nourished. No distress.  HENT:  Head: Normocephalic.  Eyes: Conjunctivae and EOM are normal.  Cardiovascular: Normal rate.   Pulmonary/Chest: Effort normal. No stridor.  Musculoskeletal: Normal range of motion.  Full ROM to the left second digit.  Neurological: He is alert and oriented to person, place, and time.  Sensation is intact.  Skin: Laceration noted.  1 cm jagged full  thickness laceration to the radial side of the left second digit.  Psychiatric: He has a normal mood and affect.    ED Course  Procedures (including critical care time)  DIAGNOSTIC STUDIES: Oxygen Saturation is 99% on room air, normal by my interpretation.    COORDINATION OF CARE:  18:20- Discussed planned course of treatment with the patient, including repairing the laceration, who is agreeable at this time.  18:30- LACERATION REPAIR PROCEDURE NOTE The patient's identification was confirmed and consent was obtained. This procedure was performed by Wynetta Emery, PA-C at 18:30 PM. Site: radial side of the left second digit Sterile procedures observed irrigated with saline Anesthetic used (type and amt): 1 mL of 2% licocaine without epinephrine Suture type/size: 4/O polyprolene Length:1 cm # of Sutures: 3  Technique:simple interrupted  Complexity: simple  Antibx ointment applied: Bacitracin Tetanus UTD or ordered: Ordered Site anesthetized, irrigated with NS, explored without evidence of foreign body, wound well approximated, site covered with dry, sterile dressing.  Patient tolerated procedure well without complications. Instructions for care discussed verbally and patient provided with additional written instructions for homecare and f/u.  18:30- Medication Orders- TDaP (BOOSTRIX) injection 0.5 mL- once.  Labs Reviewed - No data to display No results found.   1. Laceration of finger without complication, initial encounter       MDM  Andrew Joseph is a 67 y.o. male complaining of laceration to nondominant hand with no tendon or nerve involvement. Tetanus updated, wound closed without complication.   Filed Vitals:   03/21/13 1734  BP: 144/74  Pulse: 62  Temp: 98 F (36.7 C)  TempSrc: Oral  Resp: 22  SpO2: 99%     Pt verbalized understanding and agrees with care plan. Outpatient follow-up and return precautions given.    Discharge Medication List as of  03/21/2013  6:42 PM    START taking these medications   Details  cephALEXin (KEFLEX) 500 MG capsule Take 1 capsule (500 mg total) by mouth 2 (two) times daily., Starting 03/21/2013, Until Discontinued, Print        I personally performed the services described in this documentation, which was scribed in my presence. The recorded information has been reviewed and is accurate.        Wynetta Emery, PA-C 03/23/13 [id number]

## 2013-03-21 NOTE — ED Notes (Signed)
Pt reports he cut his finger w/ a hand saw today

## 2013-03-23 NOTE — ED Provider Notes (Signed)
Medical screening examination/treatment/procedure(s) were performed by non-physician practitioner and as supervising physician I was immediately available for consultation/collaboration.  Glynn Octave, MD 03/23/13 1100

## 2013-03-29 ENCOUNTER — Encounter (HOSPITAL_COMMUNITY): Payer: Self-pay | Admitting: Emergency Medicine

## 2013-03-29 ENCOUNTER — Emergency Department (HOSPITAL_COMMUNITY)
Admission: EM | Admit: 2013-03-29 | Discharge: 2013-03-29 | Disposition: A | Discharge status: Home / Self Care | Payer: Medicare Other | Attending: Emergency Medicine | Admitting: Emergency Medicine

## 2013-03-29 DIAGNOSIS — I2510 Atherosclerotic heart disease of native coronary artery without angina pectoris: Secondary | ICD-10-CM | POA: Insufficient documentation

## 2013-03-29 DIAGNOSIS — Z8659 Personal history of other mental and behavioral disorders: Secondary | ICD-10-CM | POA: Insufficient documentation

## 2013-03-29 DIAGNOSIS — Z8719 Personal history of other diseases of the digestive system: Secondary | ICD-10-CM | POA: Insufficient documentation

## 2013-03-29 DIAGNOSIS — Z4802 Encounter for removal of sutures: Secondary | ICD-10-CM

## 2013-03-29 DIAGNOSIS — Z8673 Personal history of transient ischemic attack (TIA), and cerebral infarction without residual deficits: Secondary | ICD-10-CM | POA: Insufficient documentation

## 2013-03-29 DIAGNOSIS — Z7982 Long term (current) use of aspirin: Secondary | ICD-10-CM | POA: Insufficient documentation

## 2013-03-29 DIAGNOSIS — Z8639 Personal history of other endocrine, nutritional and metabolic disease: Secondary | ICD-10-CM | POA: Insufficient documentation

## 2013-03-29 DIAGNOSIS — Z862 Personal history of diseases of the blood and blood-forming organs and certain disorders involving the immune mechanism: Secondary | ICD-10-CM | POA: Insufficient documentation

## 2013-03-29 DIAGNOSIS — G4700 Insomnia, unspecified: Secondary | ICD-10-CM | POA: Insufficient documentation

## 2013-03-29 DIAGNOSIS — Z8679 Personal history of other diseases of the circulatory system: Secondary | ICD-10-CM | POA: Insufficient documentation

## 2013-03-29 DIAGNOSIS — Z79899 Other long term (current) drug therapy: Secondary | ICD-10-CM | POA: Insufficient documentation

## 2013-03-29 DIAGNOSIS — Z85828 Personal history of other malignant neoplasm of skin: Secondary | ICD-10-CM | POA: Insufficient documentation

## 2013-03-29 DIAGNOSIS — E7800 Pure hypercholesterolemia, unspecified: Secondary | ICD-10-CM | POA: Insufficient documentation

## 2013-03-29 DIAGNOSIS — Z8546 Personal history of malignant neoplasm of prostate: Secondary | ICD-10-CM | POA: Insufficient documentation

## 2013-03-29 DIAGNOSIS — Z4801 Encounter for change or removal of surgical wound dressing: Secondary | ICD-10-CM | POA: Insufficient documentation

## 2013-03-29 DIAGNOSIS — I10 Essential (primary) hypertension: Secondary | ICD-10-CM | POA: Insufficient documentation

## 2013-03-29 NOTE — ED Provider Notes (Signed)
History     CSN: [id number]  Arrival date & time 03/29/13  1712   First MD Initiated Contact with Patient 03/29/13 1716      No chief complaint on file.   (Consider location/radiation/quality/duration/timing/severity/associated sxs/prior treatment) HPI Comments: Patient presents for suture removal. The laceration is located left index finger. The patient reports subjective healing of the laceration and denies any complications or concerns. The patient denies any pain, erythema, tenderness, drainage of the site. Denies fever, NVD, abdominal pain, numbness/tingling, skin color changes.        Past Medical History  Diagnosis Date  . CAD (coronary artery disease)     DES mid lad 08/15/2009  . Chest pain, unspecified   . Hypokalemia   . BPH (benign prostatic hyperplasia)   . HTN (hypertension)   . Hyperlipidemia   . Anxiety   . Hypercholesterolemia   . Insomnia, persistent   . Neoplasm of uncertain behavior of skin   . Blood in stool   . Allergic rhinitis, cause unspecified   . History of prostate cancer 2009    Dr Annabell Howells  . CVA (cerebrovascular accident due to intracerebral hemorrhage) 2007 [place]    Past Surgical History  Procedure Laterality Date  . Prostatectomy  2009  . Tonsillectomy      Family History  Problem Relation Age of Onset  . Stroke Mother   . Heart disease Father     MI  . Diabetes Brother     Obese    History  Substance Use Topics  . Smoking status: Never Smoker   . Smokeless tobacco: Never Used  . Alcohol Use: Yes     Comment: Occasional glass of wine      Review of Systems  Skin: Positive for wound.  All other systems reviewed and are negative.    Allergies  Iodine and Shellfish allergy  Home Medications   Current Outpatient Rx  Name  Route  Sig  Dispense  Refill  . aspirin 325 MG tablet   Oral   Take 325 mg by mouth every morning.          . ezetimibe-simvastatin (VYTORIN) 10-40 MG per tablet   Oral   Take 1 tablet by  mouth at bedtime.   90 tablet   3   . labetalol (NORMODYNE) 300 MG tablet   Oral   Take 150 mg by mouth 2 (two) times daily.         [name] losartan-hydrochlorothiazide (HYZAAR) 100-25 MG per tablet   Oral   Take 1 tablet by mouth every evening.         . Melatonin-Pyridoxine (MELATIN PO)   Oral   Take 3-10 mg by mouth at bedtime as needed (sleep).         . zolpidem (AMBIEN) 10 MG tablet   Oral   Take 5 mg by mouth at bedtime as needed for sleep.           BP 136/97  Pulse 54  Temp(Src) 97.9 F (36.6 C) (Oral)  Resp 20  Wt 180 lb (81.647 kg)  BMI 29.95 kg/m2  SpO2 97%  Physical Exam  Nursing note and vitals reviewed. Constitutional: He is oriented to person, place, and time. He appears well-developed and well-nourished. No distress.  HENT:  Head: Normocephalic and atraumatic.  Eyes: Conjunctivae are normal.  Neck: Normal range of motion.  Cardiovascular: Normal rate and regular rhythm.  Exam reveals no gallop and no friction rub.   No murmur heard.  Pulmonary/Chest: Effort normal and breath sounds normal. He has no wheezes. He has no rales. He exhibits no tenderness.  Abdominal: Soft. He exhibits no distension.  Musculoskeletal: Normal range of motion.  Neurological: He is alert and oriented to person, place, and time. Coordination normal.  Speech is goal-oriented. Moves limbs without ataxia.   Skin: Skin is warm and dry.  Well healed 1 cm laceration of left index finger with 3 sutures intact.   Psychiatric: He has a normal mood and affect. His behavior is normal.    ED Course  Procedures (including critical care time)  SUTURE REMOVAL Performed by: Emilia Beck  Consent: Verbal consent obtained. Patient identity confirmed: provided demographic data Time out: Immediately prior to procedure a "time out" was called to verify the correct patient, procedure, equipment, support staff and site/side marked as required.  Location details: left index  finger  Wound Appearance: clean  Sutures/Staples Removed: 3  Facility: sutures placed in this facility Patient tolerance: Patient tolerated the procedure well with no immediate complications.     Labs Reviewed - No data to display No results found.   1. Visit for suture removal       MDM  5:39 PM Sutures removed without complication. Laceration is well healed and does not appear to be infected. No neurovascular compromise. Vitals stable and patient afebrile.         Emilia Beck, PA-C 03/29/13 1747

## 2013-03-29 NOTE — ED Notes (Signed)
Patient needs suture removal on left second finger.

## 2013-03-30 NOTE — ED Provider Notes (Signed)
Medical screening examination/treatment/procedure(s) were performed by non-physician practitioner and as supervising physician I was immediately available for consultation/collaboration.  Olyvia Gopal, MD 03/30/13 0127 

## 2013-08-11 ENCOUNTER — Telehealth: Payer: Self-pay | Admitting: Internal Medicine

## 2013-08-11 ENCOUNTER — Telehealth: Payer: Self-pay | Admitting: Gastroenterology

## 2013-08-11 NOTE — Telephone Encounter (Signed)
Patient is requesting refills of losartan and ambien to be sent to his pharmacy.  Almost out of losartan.  Pharmacy phone number is [phone] [facility].

## 2013-08-11 NOTE — Telephone Encounter (Signed)
Patient will come in and see Dr. Leone Payor tomorrow at 8:45

## 2013-08-12 ENCOUNTER — Ambulatory Visit (INDEPENDENT_AMBULATORY_CARE_PROVIDER_SITE_OTHER): Payer: Medicare Other

## 2013-08-12 ENCOUNTER — Ambulatory Visit (INDEPENDENT_AMBULATORY_CARE_PROVIDER_SITE_OTHER): Payer: Medicare Other | Admitting: Internal Medicine

## 2013-08-12 ENCOUNTER — Encounter: Payer: Self-pay | Admitting: Internal Medicine

## 2013-08-12 VITALS — BP 144/74 | HR 68 | Ht 66.25 in | Wt 183.1 lb

## 2013-08-12 DIAGNOSIS — D72819 Decreased white blood cell count, unspecified: Secondary | ICD-10-CM

## 2013-08-12 DIAGNOSIS — E785 Hyperlipidemia, unspecified: Secondary | ICD-10-CM

## 2013-08-12 DIAGNOSIS — D696 Thrombocytopenia, unspecified: Secondary | ICD-10-CM

## 2013-08-12 DIAGNOSIS — I10 Essential (primary) hypertension: Secondary | ICD-10-CM

## 2013-08-12 DIAGNOSIS — K648 Other hemorrhoids: Secondary | ICD-10-CM

## 2013-08-12 LAB — CBC WITH DIFFERENTIAL/PLATELET
Eosinophils Absolute: 0.1 10*3/uL (ref 0.0–0.7)
MCHC: 34.7 g/dL (ref 30.0–36.0)
MCV: 88.1 fl (ref 78.0–100.0)
Monocytes Absolute: 0.4 10*3/uL (ref 0.1–1.0)
Neutrophils Relative %: 67.6 % (ref 43.0–77.0)
Platelets: 146 10*3/uL — ABNORMAL LOW (ref 150.0–400.0)

## 2013-08-12 LAB — COMPREHENSIVE METABOLIC PANEL
AST: 17 U/L (ref 0–37)
Albumin: 4.2 g/dL (ref 3.5–5.2)
BUN: 16 mg/dL (ref 6–23)
Calcium: 9.2 mg/dL (ref 8.4–10.5)
Chloride: 105 mEq/L (ref 96–112)
Glucose, Bld: 101 mg/dL — ABNORMAL HIGH (ref 70–99)
Potassium: 3.4 mEq/L — ABNORMAL LOW (ref 3.5–5.1)
Sodium: 137 mEq/L (ref 135–145)
Total Protein: 7 g/dL (ref 6.0–8.3)

## 2013-08-12 LAB — HEPATIC FUNCTION PANEL
Bilirubin, Direct: 0.1 mg/dL (ref 0.0–0.3)
Total Bilirubin: 0.9 mg/dL (ref 0.3–1.2)
Total Protein: 7 g/dL (ref 6.0–8.3)

## 2013-08-12 MED ORDER — ZOLPIDEM TARTRATE 10 MG PO TABS: 5.0000 mg | ORAL_TABLET | Freq: Every evening | ORAL | Status: DC | PRN

## 2013-08-12 MED ORDER — HYDROCORTISONE 2.5 % RE CREA: TOPICAL_CREAM | Freq: Two times a day (BID) | RECTAL | Status: AC

## 2013-08-12 MED ORDER — LOSARTAN POTASSIUM-HCTZ 100-25 MG PO TABS: 1.0000 | ORAL_TABLET | Freq: Every evening | ORAL | Status: DC

## 2013-08-12 NOTE — Progress Notes (Signed)
Quick Note:  Please have lab add a lipid panel  He was fasting ______

## 2013-08-12 NOTE — Assessment & Plan Note (Signed)
Wants to think about ligation Trial of hydrocortisone cream and then f/u Suspect ligation would help given prolapse changes at rectal bx 2010

## 2013-08-12 NOTE — Progress Notes (Signed)
Subjective:    Patient ID: Andrew Joseph, male    DOB: 1946/10/15, 67 y.o.   MRN: [id number]  HPI The patient is her because he has noticed a bulge in the anal area when he wipes. Also has some intermittent bright red blood on the toilet paper.Colonoscopy 2010 Russella Dar - polyps, internal hemorrhoids - ? Proctitis - "findings c/w prolapse". He denies constipation, excessive toilet time or straining to stool.  Allergies  Allergen Reactions  . Iodine     Affected my heart some how  . Shellfish Allergy Rash    Turns red   Outpatient Prescriptions Prior to Visit  Medication Sig Dispense Refill  . aspirin 325 MG tablet Take 325 mg by mouth every morning.             . labetalol (NORMODYNE) 300 MG tablet Take 150 mg by mouth 2 (two) times daily.      . Melatonin-Pyridoxine (MELATIN PO) Take 3-10 mg by mouth at bedtime as needed (sleep).      . losartan-hydrochlorothiazide (HYZAAR) 100-25 MG per tablet Take 1 tablet by mouth every evening.      . zolpidem (AMBIEN) 10 MG tablet Take 5 mg by mouth at bedtime as needed for sleep.       No facility-administered medications prior to visit.   Past Medical History  Diagnosis Date  . CAD (coronary artery disease)     DES mid lad 08/15/2009  . Chest pain, unspecified   . Hypokalemia   . BPH (benign prostatic hyperplasia)   . HTN (hypertension)   . Hyperlipidemia   . Anxiety   . Hypercholesterolemia   . Insomnia, persistent   . Neoplasm of uncertain behavior of skin   . Blood in stool   . Allergic rhinitis, cause unspecified   . History of prostate cancer 2009    Dr Annabell Howells  . CVA (cerebrovascular accident due to intracerebral hemorrhage) 2007 [place]  . Colon polyps    Past Surgical History  Procedure Laterality Date  . Prostatectomy  2009  . Tonsillectomy    . Angioplasty      with stents   History   Social History  . Marital Status: Divorced    Spouse Name: N/A    Number of Children: 3  . Years of Education: JD    Occupational History  . [employer] JUDGE     Semi-retired    Social History Main Topics  . Smoking status: Never Smoker   . Smokeless tobacco: Never Used  . Alcohol Use: Yes     Comment: Occasional glass of wine  . Drug Use: No  . Sexual Activity: Not Currently    Family History  Problem Relation Age of Onset  . Stroke Mother   . Heart disease Father     MI  . Diabetes Brother     Obese    Review of Systems As per HPI    Objective:   Physical Exam General:  NAD Eyes:   anicteric Abdomen:  soft and nontender, BS+ Rectal:  normal anoderm, sphincter tone at rest slightly reduced, no abnormal descent, voluntary squeeze somewhat reduced, prostate is absent, no mass Ext:   no edema  Anoscopy - Grade 2 internal hemorrhoids RA>RP>LL with punctate mucosal erythema    Assessment & Plan:  Hemorrhoids, internal, with bleeding and grade 2 prolapse -   Other and unspecified hyperlipidemia -  Thrombocytopenia, unspecified   Leukopenia -  Essential hypertension, benign -   Plan: CBC with  Differential, Hepatic function panel, Comprehensive metabolic panel - he requested thes labs as he has not been to PCP in some time - he has fasted. He has stopped Vytorin x 6 months and wanted cholesterol checked.  Not sure why has leukopenia and thrombocytopenia  We discussed treatment of internal hemorrhoids including ligation as an option. He will consider but wants to think about that. i do think he is a good candidate to have that performed in the office. In the meantime he will use hydrocortisone cream bid x 1 week and then see me in about 2 months for reassessment. Could do ligation then. He says he is wary of any procedures as he regrets his prostatectomy.   I appreciate the opportunity to care for this patient.   Lab Results  Component Value Date   WBC 4.6 08/12/2013   HGB 15.8 08/12/2013   HCT 45.4 08/12/2013   MCV 88.1 08/12/2013   PLT 146.0* 08/12/2013     Chemistry       Component Value Date/Time   NA 137 08/12/2013 0936   K 3.4* 08/12/2013 0936   CL 105 08/12/2013 0936   CO2 26 08/12/2013 0936   BUN 16 08/12/2013 0936   CREATININE 1.2 08/12/2013 0936      Component Value Date/Time   CALCIUM 9.2 08/12/2013 0936   ALKPHOS 59 08/12/2013 0936   ALKPHOS 59 08/12/2013 0936   AST 17 08/12/2013 0936   AST 17 08/12/2013 0936   ALT 17 08/12/2013 0936   ALT 17 08/12/2013 0936   BILITOT 0.9 08/12/2013 0936   BILITOT 0.9 08/12/2013 0936        Cc:Sonda Primes, MD

## 2013-08-12 NOTE — Patient Instructions (Addendum)
We have sent the following medications to your pharmacy for you to pick up at your convenience: Anusol use for 1 week Your physician has requested that you go to the basement for the following lab work before leaving today: CBC, LIPID, CMET  We have given you brochures regarding Hemorrhoid banding and hemorrhoids. Follow up in 6 weeks with Dr Leone Payor CC:  Jacinta Shoe MD

## 2013-08-12 NOTE — Telephone Encounter (Signed)
Done

## 2013-08-12 NOTE — Telephone Encounter (Signed)
OK to fill Zolpidem, Losartan prescriptions with additional refills x0. Needs OV Thank you!

## 2013-08-12 NOTE — Telephone Encounter (Signed)
Losartan HCTZ sent to pharmacy. Please advise on Zolpidem.

## 2013-08-13 ENCOUNTER — Other Ambulatory Visit: Payer: Self-pay

## 2013-08-13 DIAGNOSIS — I10 Essential (primary) hypertension: Secondary | ICD-10-CM

## 2013-08-16 ENCOUNTER — Other Ambulatory Visit: Payer: Self-pay

## 2013-08-16 LAB — LIPID PANEL: Triglycerides: 129 mg/dL (ref 0.0–149.0)

## 2013-08-16 LAB — LDL CHOLESTEROL, DIRECT: Direct LDL: 296.1 mg/dL

## 2013-08-16 MED ORDER — POTASSIUM CHLORIDE CRYS ER 20 MEQ PO TBCR: 20.0000 meq | EXTENDED_RELEASE_TABLET | Freq: Every day | ORAL | Status: DC

## 2013-08-16 NOTE — Progress Notes (Signed)
Quick Note:  Let him know labs mostly ok  1) K is low so will start KCL 20 mEq daily # 30 2 refills 2) Platelets just slightly low - almost normal, probably not an issue 3) we did not get lipids done - our mistake - sorry but he needs to come back for overnight (8 hour) fast and get them done and will call ______

## 2013-08-16 NOTE — Telephone Encounter (Signed)
Pt aware and potassium sent to the pharmacy

## 2013-08-17 ENCOUNTER — Telehealth: Payer: Self-pay | Admitting: *Deleted

## 2013-08-17 NOTE — Telephone Encounter (Signed)
Message copied by Merrilyn Puma on Tue Aug 17, 2013  9:21 AM ------      Message from: Etheleen Sia      Created: Fri Aug 13, 2013 10:01 AM       Pt said he will call back to schedule.      ----- Message -----         From: Merrilyn Puma, CMA         Sent: 08/12/2013   4:48 PM           To: Etheleen Sia, Phetcharat Noitamyae            Pt needs OV per PCP.             Thanks!       ------

## 2013-08-17 NOTE — Telephone Encounter (Signed)
Message copied by Merrilyn Puma on Tue Aug 17, 2013  9:22 AM ------      Message from: Livingston Diones      Created: Fri Aug 13, 2013  8:14 AM       Called pt to set up an appt, pt stated he will call back to schedule an appt.       ----- Message -----         From: Merrilyn Puma, CMA         Sent: 08/12/2013   4:48 PM           To: Etheleen Sia, Phetcharat Noitamyae            Pt needs OV per PCP.             Thanks!       ------

## 2013-08-17 NOTE — Progress Notes (Signed)
Quick Note:  Cholesterol very high at 363 Needs to go back to PCP Dr. Posey Rea - needs medication Ok to give patient copies of labs  ______

## 2013-09-24 ENCOUNTER — Ambulatory Visit (INDEPENDENT_AMBULATORY_CARE_PROVIDER_SITE_OTHER): Payer: Medicare Other | Admitting: Internal Medicine

## 2013-09-24 ENCOUNTER — Encounter: Payer: Self-pay | Admitting: Internal Medicine

## 2013-09-24 VITALS — BP 124/68 | HR 64 | Ht 66.25 in | Wt 180.1 lb

## 2013-09-24 DIAGNOSIS — K648 Other hemorrhoids: Secondary | ICD-10-CM

## 2013-09-24 DIAGNOSIS — E876 Hypokalemia: Secondary | ICD-10-CM | POA: Insufficient documentation

## 2013-09-24 NOTE — Progress Notes (Signed)
  Subjective:    Patient ID: Andrew Joseph, male    DOB: 03-Aug-1946, 67 y.o.   MRN: [id number]  HPI  Age and presents for followup. Was diagnosed with grade 2 bleeding prolapsed hemorrhoids last visit. He has used some intermittent hydrocortisone cream, he has been using a stool softener he denies symptoms from his hemorrhoids at this point. He is not interested in banding treatment.  Review of Systems As above, he had hypokalemia and supplementation was prescribed. He has not yet followed up with his primary care provider    Objective:   Physical Exam Well-developed elderly man in no acute distress    Assessment & Plan:

## 2013-09-24 NOTE — Patient Instructions (Signed)
Glad to hear the hemorrhoids are better. Please let us know if you need our help in the future. Please make an appointment to see Dr. Posey Rea to review recent labs and primary care needs.  I appreciate the opportunity to care for you. Iva Boop, MD, [facility]

## 2013-09-24 NOTE — Assessment & Plan Note (Signed)
Mild, to followup with Dr. Posey Rea.

## 2013-09-24 NOTE — Assessment & Plan Note (Addendum)
Improved with conservative treatment. He will followup as needed. He does not desire more definitive therapy with banding.

## 2013-11-06 ENCOUNTER — Other Ambulatory Visit: Payer: Self-pay | Admitting: Internal Medicine

## 2013-11-12 ENCOUNTER — Other Ambulatory Visit: Payer: Self-pay | Admitting: Internal Medicine

## 2013-11-12 NOTE — Telephone Encounter (Signed)
Ok to refill? Last OV 8.13.13 Last filled 9.11.14

## 2013-11-29 ENCOUNTER — Other Ambulatory Visit: Payer: Self-pay | Admitting: Internal Medicine

## 2013-12-03 ENCOUNTER — Telehealth: Payer: Self-pay | Admitting: Internal Medicine

## 2013-12-03 MED ORDER — POTASSIUM CHLORIDE CRYS ER 20 MEQ PO TBCR: 20.0000 meq | EXTENDED_RELEASE_TABLET | Freq: Every day | ORAL | Status: DC

## 2013-12-03 NOTE — Telephone Encounter (Signed)
Pt informed we do not have samples of either med. Last OV was with PCP 07/2012. I informed him he needs OV and office policy on refills. He states he is leaving Sunday for a month and needs these meds today. Please advise in PCP's absence.

## 2013-12-03 NOTE — Telephone Encounter (Signed)
I am not willing to do this for someone we have not seen in  1 1/2 years

## 2013-12-03 NOTE — Telephone Encounter (Signed)
Patient is requesting refills for ambien and potassium.  Patient states he is leaving Sunday afternoon for the rest of January.  I told him that Dr. Alain Marion is out of the office.  He is wanting to know if there are samples he can get. Please call in regard.

## 2013-12-03 NOTE — Telephone Encounter (Signed)
I spoke to Dr. Carmon Sails is fine with Kcl refill x 1 month. Pt needs OV with PCP for Ambien Rf. Pt informed and transferred to scheduler for OV.

## 2013-12-08 ENCOUNTER — Encounter: Payer: Self-pay | Admitting: Gastroenterology

## 2013-12-10 ENCOUNTER — Ambulatory Visit (INDEPENDENT_AMBULATORY_CARE_PROVIDER_SITE_OTHER): Payer: Medicare Other | Admitting: Internal Medicine

## 2013-12-10 ENCOUNTER — Encounter: Payer: Self-pay | Admitting: Internal Medicine

## 2013-12-10 VITALS — BP 150/90 | HR 80 | Temp 96.3°F | Resp 16 | Wt 180.0 lb

## 2013-12-10 DIAGNOSIS — R799 Abnormal finding of blood chemistry, unspecified: Secondary | ICD-10-CM

## 2013-12-10 DIAGNOSIS — I2510 Atherosclerotic heart disease of native coronary artery without angina pectoris: Secondary | ICD-10-CM

## 2013-12-10 DIAGNOSIS — E785 Hyperlipidemia, unspecified: Secondary | ICD-10-CM

## 2013-12-10 DIAGNOSIS — R7309 Other abnormal glucose: Secondary | ICD-10-CM

## 2013-12-10 DIAGNOSIS — F411 Generalized anxiety disorder: Secondary | ICD-10-CM

## 2013-12-10 DIAGNOSIS — I10 Essential (primary) hypertension: Secondary | ICD-10-CM

## 2013-12-10 DIAGNOSIS — Z8546 Personal history of malignant neoplasm of prostate: Secondary | ICD-10-CM

## 2013-12-10 DIAGNOSIS — R739 Hyperglycemia, unspecified: Secondary | ICD-10-CM

## 2013-12-10 MED ORDER — LABETALOL HCL 300 MG PO TABS: 150.0000 mg | ORAL_TABLET | Freq: Two times a day (BID) | ORAL | Status: DC

## 2013-12-10 MED ORDER — POTASSIUM CHLORIDE CRYS ER 20 MEQ PO TBCR: 20.0000 meq | EXTENDED_RELEASE_TABLET | Freq: Every day | ORAL | Status: DC

## 2013-12-10 MED ORDER — EZETIMIBE-SIMVASTATIN 10-40 MG PO TABS: 1.0000 | ORAL_TABLET | Freq: Every day | ORAL | Status: DC

## 2013-12-10 MED ORDER — TRIAMCINOLONE ACETONIDE 0.5 % EX CREA
1.0000 | TOPICAL_CREAM | Freq: Three times a day (TID) | CUTANEOUS | Status: DC
Start: 2013-12-10 — End: 2015-09-01

## 2013-12-10 MED ORDER — LOSARTAN POTASSIUM-HCTZ 100-25 MG PO TABS: 1.0000 | ORAL_TABLET | Freq: Every evening | ORAL | Status: DC

## 2013-12-10 MED ORDER — ZOLPIDEM TARTRATE 10 MG PO TABS: 5.0000 mg | ORAL_TABLET | Freq: Every evening | ORAL | Status: DC | PRN

## 2013-12-10 NOTE — Assessment & Plan Note (Signed)
Labs

## 2013-12-10 NOTE — Assessment & Plan Note (Signed)
Continue with current prescription therapy as reflected on the Med list.  

## 2013-12-10 NOTE — Progress Notes (Signed)
   Subjective:     HPI  The patient presents for a follow-up of  chronic hypertension, chronic dyslipidemia, CAD, insomnia controlled with medicines    Review of Systems  Constitutional: Negative for appetite change, fatigue and unexpected weight change.  HENT: Negative for congestion, nosebleeds, sneezing, sore throat and trouble swallowing.   Eyes: Negative for itching and visual disturbance.  Respiratory: Negative for cough.   Cardiovascular: Negative for chest pain, palpitations and leg swelling.  Gastrointestinal: Negative for nausea, diarrhea, blood in stool and abdominal distention.  Genitourinary: Negative for frequency and hematuria.  Musculoskeletal: Negative for back pain, gait problem, joint swelling and neck pain.  Skin: Negative for rash.  Neurological: Negative for dizziness, tremors, speech difficulty and weakness.  Psychiatric/Behavioral: Negative for sleep disturbance, dysphoric mood and agitation. The patient is nervous/anxious (ED).    Wt Readings from Last 3 Encounters:  12/10/13 180 lb (81.647 kg)  09/24/13 180 lb 2 oz (81.704 kg)  08/12/13 183 lb 2 oz (83.065 kg)   BP Readings from Last 3 Encounters:  12/10/13 150/90  09/24/13 124/68  08/12/13 144/74       Objective:   Physical Exam  Constitutional: He is oriented to person, place, and time. He appears well-developed.  Obese   HENT:  Mouth/Throat: Oropharynx is clear and moist.  Eyes: Conjunctivae are normal. Pupils are equal, round, and reactive to light.  Neck: Normal range of motion. No JVD present. No thyromegaly present.  Cardiovascular: Normal rate, regular rhythm, normal heart sounds and intact distal pulses.  Exam reveals no gallop and no friction rub.   No murmur heard. Pulmonary/Chest: Effort normal and breath sounds normal. No respiratory distress. He has no wheezes. He has no rales. He exhibits no tenderness.  Abdominal: Soft. Bowel sounds are normal. He exhibits no distension and no  mass. There is no tenderness. There is no rebound and no guarding.  Musculoskeletal: Normal range of motion. He exhibits no edema and no tenderness.  Lymphadenopathy:    He has no cervical adenopathy.  Neurological: He is alert and oriented to person, place, and time. He has normal reflexes. No cranial nerve deficit. He exhibits normal muscle tone. Coordination normal.  Skin: Skin is warm and dry. No rash noted.  Psychiatric: He has a normal mood and affect. His behavior is normal. Judgment and thought content normal.   Lab Results  Component Value Date   WBC 4.6 08/12/2013   HGB 15.8 08/12/2013   HCT 45.4 08/12/2013   PLT 146.0* 08/12/2013   GLUCOSE 101* 08/12/2013   CHOL 363* 08/12/2013   TRIG 129.0 08/12/2013   HDL 36.60* 08/12/2013   LDLDIRECT 296.1 08/12/2013   LDLCALC 105* 07/17/2012   ALT 17 08/12/2013   ALT 17 08/12/2013   AST 17 08/12/2013   AST 17 08/12/2013   NA 137 08/12/2013   K 3.4* 08/12/2013   CL 105 08/12/2013   CREATININE 1.2 08/12/2013   BUN 16 08/12/2013   CO2 26 08/12/2013   TSH 1.96 07/17/2012   INR 0.99 07/31/2011   HGBA1C 5.4 07/17/2012          Assessment & Plan:

## 2013-12-10 NOTE — Progress Notes (Signed)
Pre visit review using our clinic review tool, if applicable. No additional management support is needed unless otherwise documented below in the visit note. 

## 2013-12-10 NOTE — Assessment & Plan Note (Addendum)
Continue with current prescription therapy as reflected on the Med list. Well w/labs in 3 mo

## 2014-03-09 ENCOUNTER — Other Ambulatory Visit (INDEPENDENT_AMBULATORY_CARE_PROVIDER_SITE_OTHER): Payer: Medicare Other

## 2014-03-09 DIAGNOSIS — Z8546 Personal history of malignant neoplasm of prostate: Secondary | ICD-10-CM

## 2014-03-09 DIAGNOSIS — F411 Generalized anxiety disorder: Secondary | ICD-10-CM

## 2014-03-09 DIAGNOSIS — R7309 Other abnormal glucose: Secondary | ICD-10-CM

## 2014-03-09 DIAGNOSIS — R739 Hyperglycemia, unspecified: Secondary | ICD-10-CM

## 2014-03-09 DIAGNOSIS — I2510 Atherosclerotic heart disease of native coronary artery without angina pectoris: Secondary | ICD-10-CM

## 2014-03-09 DIAGNOSIS — E785 Hyperlipidemia, unspecified: Secondary | ICD-10-CM

## 2014-03-09 DIAGNOSIS — R799 Abnormal finding of blood chemistry, unspecified: Secondary | ICD-10-CM

## 2014-03-09 DIAGNOSIS — I10 Essential (primary) hypertension: Secondary | ICD-10-CM

## 2014-03-09 LAB — CBC WITH DIFFERENTIAL/PLATELET
BASOS PCT: 0.4 % (ref 0.0–3.0)
Basophils Absolute: 0 10*3/uL (ref 0.0–0.1)
EOS PCT: 4.9 % (ref 0.0–5.0)
Eosinophils Absolute: 0.3 10*3/uL (ref 0.0–0.7)
HCT: 44 % (ref 39.0–52.0)
Hemoglobin: 15.1 g/dL (ref 13.0–17.0)
Lymphocytes Relative: 19.1 % (ref 12.0–46.0)
Lymphs Abs: 1 10*3/uL (ref 0.7–4.0)
MCHC: 34.3 g/dL (ref 30.0–36.0)
MCV: 88.6 fl (ref 78.0–100.0)
Monocytes Absolute: 0.4 10*3/uL (ref 0.1–1.0)
Monocytes Relative: 8.5 % (ref 3.0–12.0)
NEUTROS PCT: 67.1 % (ref 43.0–77.0)
Neutro Abs: 3.5 10*3/uL (ref 1.4–7.7)
Platelets: 130 10*3/uL — ABNORMAL LOW (ref 150.0–400.0)
RBC: 4.97 Mil/uL (ref 4.22–5.81)
RDW: 14.6 % (ref 11.5–14.6)
WBC: 5.2 10*3/uL (ref 4.5–10.5)

## 2014-03-09 LAB — URINALYSIS
BILIRUBIN URINE: NEGATIVE
HGB URINE DIPSTICK: NEGATIVE
KETONES UR: NEGATIVE
Leukocytes, UA: NEGATIVE
Nitrite: NEGATIVE
Specific Gravity, Urine: 1.02 (ref 1.000–1.030)
TOTAL PROTEIN, URINE-UPE24: NEGATIVE
URINE GLUCOSE: NEGATIVE
UROBILINOGEN UA: 0.2 (ref 0.0–1.0)
pH: 6 (ref 5.0–8.0)

## 2014-03-09 LAB — HEPATIC FUNCTION PANEL
ALBUMIN: 3.9 g/dL (ref 3.5–5.2)
ALK PHOS: 58 U/L (ref 39–117)
ALT: 27 U/L (ref 0–53)
AST: 25 U/L (ref 0–37)
Bilirubin, Direct: 0.1 mg/dL (ref 0.0–0.3)
Total Bilirubin: 0.8 mg/dL (ref 0.3–1.2)
Total Protein: 6.4 g/dL (ref 6.0–8.3)

## 2014-03-09 LAB — BASIC METABOLIC PANEL
BUN: 12 mg/dL (ref 6–23)
CALCIUM: 9.2 mg/dL (ref 8.4–10.5)
CHLORIDE: 105 meq/L (ref 96–112)
CO2: 29 mEq/L (ref 19–32)
Creatinine, Ser: 1 mg/dL (ref 0.4–1.5)
GFR: 76.13 mL/min (ref >60.00)
Glucose, Bld: 103 mg/dL — ABNORMAL HIGH (ref 70–99)
Potassium: 3.8 mEq/L (ref 3.5–5.1)
Sodium: 139 mEq/L (ref 135–145)

## 2014-03-09 LAB — LIPID PANEL
CHOL/HDL RATIO: 4
CHOLESTEROL: 171 mg/dL (ref 0–200)
HDL: 45.4 mg/dL (ref >39.00)
LDL Cholesterol: 115 mg/dL — ABNORMAL HIGH (ref 0–99)
Triglycerides: 51 mg/dL (ref 0.0–149.0)
VLDL: 10.2 mg/dL (ref 0.0–40.0)

## 2014-03-09 LAB — TSH: TSH: 2.28 u[IU]/mL (ref 0.35–5.50)

## 2014-03-09 LAB — PSA

## 2014-03-09 LAB — HEMOGLOBIN A1C: Hgb A1c MFr Bld: 5.8 % (ref 4.6–6.5)

## 2014-03-11 ENCOUNTER — Ambulatory Visit (INDEPENDENT_AMBULATORY_CARE_PROVIDER_SITE_OTHER): Payer: Medicare Other | Admitting: Internal Medicine

## 2014-03-11 ENCOUNTER — Encounter: Payer: Self-pay | Admitting: Internal Medicine

## 2014-03-11 ENCOUNTER — Telehealth: Payer: Self-pay | Admitting: Internal Medicine

## 2014-03-11 VITALS — BP 130/74 | HR 69 | Temp 98.5°F | Ht 65.0 in | Wt 185.5 lb

## 2014-03-11 DIAGNOSIS — I2510 Atherosclerotic heart disease of native coronary artery without angina pectoris: Secondary | ICD-10-CM

## 2014-03-11 DIAGNOSIS — Z8546 Personal history of malignant neoplasm of prostate: Secondary | ICD-10-CM

## 2014-03-11 DIAGNOSIS — Z0000 Encounter for general adult medical examination without abnormal findings: Secondary | ICD-10-CM

## 2014-03-11 DIAGNOSIS — I10 Essential (primary) hypertension: Secondary | ICD-10-CM

## 2014-03-11 MED ORDER — VITAMIN D 1000 UNITS PO TABS: 1000.0000 [IU] | ORAL_TABLET | Freq: Every day | ORAL | Status: DC

## 2014-03-11 MED ORDER — EZETIMIBE-SIMVASTATIN 10-40 MG PO TABS: 1.0000 | ORAL_TABLET | Freq: Every day | ORAL | Status: DC

## 2014-03-11 NOTE — Assessment & Plan Note (Signed)
Declined Card ref

## 2014-03-11 NOTE — Assessment & Plan Note (Signed)
PSA 0.0 

## 2014-03-11 NOTE — Progress Notes (Signed)
Pre visit review using our clinic review tool, if applicable. No additional management support is needed unless otherwise documented below in the visit note. 

## 2014-03-11 NOTE — Progress Notes (Signed)
   Subjective:     HPI  The patient presents for a follow-up of  chronic hypertension, chronic dyslipidemia, CAD, insomnia controlled with medicines    Review of Systems  Constitutional: Negative for appetite change, fatigue and unexpected weight change.  HENT: Negative for congestion, nosebleeds, sneezing, sore throat and trouble swallowing.   Eyes: Negative for itching and visual disturbance.  Respiratory: Negative for cough.   Cardiovascular: Negative for chest pain, palpitations and leg swelling.  Gastrointestinal: Negative for nausea, diarrhea, blood in stool and abdominal distention.  Genitourinary: Negative for frequency and hematuria.  Musculoskeletal: Negative for back pain, gait problem, joint swelling and neck pain.  Skin: Negative for rash.  Neurological: Negative for dizziness, tremors, speech difficulty and weakness.  Psychiatric/Behavioral: Negative for sleep disturbance, dysphoric mood and agitation. The patient is nervous/anxious (ED).    Wt Readings from Last 3 Encounters:  03/11/14 185 lb 8 oz (84.142 kg)  12/10/13 180 lb (81.647 kg)  09/24/13 180 lb 2 oz (81.704 kg)   BP Readings from Last 3 Encounters:  03/11/14 130/74  12/10/13 150/90  09/24/13 124/68       Objective:   Physical Exam  Constitutional: He is oriented to person, place, and time. He appears well-developed.  Obese   HENT:  Mouth/Throat: Oropharynx is clear and moist.  Eyes: Conjunctivae are normal. Pupils are equal, round, and reactive to light.  Neck: Normal range of motion. No JVD present. No thyromegaly present.  Cardiovascular: Normal rate, regular rhythm, normal heart sounds and intact distal pulses.  Exam reveals no gallop and no friction rub.   No murmur heard. Pulmonary/Chest: Effort normal and breath sounds normal. No respiratory distress. He has no wheezes. He has no rales. He exhibits no tenderness.  Abdominal: Soft. Bowel sounds are normal. He exhibits no distension and no  mass. There is no tenderness. There is no rebound and no guarding.  Musculoskeletal: Normal range of motion. He exhibits no edema and no tenderness.  Lymphadenopathy:    He has no cervical adenopathy.  Neurological: He is alert and oriented to person, place, and time. He has normal reflexes. No cranial nerve deficit. He exhibits normal muscle tone. Coordination normal.  Skin: Skin is warm and dry. No rash noted.  Psychiatric: He has a normal mood and affect. His behavior is normal. Judgment and thought content normal.   Lab Results  Component Value Date   WBC 5.2 03/09/2014   HGB 15.1 03/09/2014   HCT 44.0 03/09/2014   PLT 130.0* 03/09/2014   GLUCOSE 103* 03/09/2014   CHOL 171 03/09/2014   TRIG 51.0 03/09/2014   HDL 45.40 03/09/2014   LDLDIRECT 296.1 08/12/2013   LDLCALC 115* 03/09/2014   ALT 27 03/09/2014   AST 25 03/09/2014   NA 139 03/09/2014   K 3.8 03/09/2014   CL 105 03/09/2014   CREATININE 1.0 03/09/2014   BUN 12 03/09/2014   CO2 29 03/09/2014   TSH 2.28 03/09/2014   PSA 0.00 Repeated and verified X2.* 03/09/2014   INR 0.99 07/31/2011   HGBA1C 5.8 03/09/2014          Assessment & Plan:

## 2014-03-11 NOTE — Assessment & Plan Note (Signed)
Continue with current prescription therapy as reflected on the Med list.  

## 2014-03-11 NOTE — Telephone Encounter (Signed)
Relevant patient education mailed to patient.  

## 2014-03-11 NOTE — Assessment & Plan Note (Signed)
We discussed age appropriate health related issues, including available/recomended screening tests and vaccinations. We discussed a need for adhering to healthy diet and exercise. Labs/EKG were reviewed/ordered. All questions were answered.  Declined shots

## 2014-06-15 ENCOUNTER — Other Ambulatory Visit: Payer: Self-pay | Admitting: Internal Medicine

## 2014-07-07 ENCOUNTER — Encounter: Payer: Self-pay | Admitting: Internal Medicine

## 2014-07-07 ENCOUNTER — Ambulatory Visit (INDEPENDENT_AMBULATORY_CARE_PROVIDER_SITE_OTHER): Payer: Medicare Other | Admitting: Internal Medicine

## 2014-07-07 VITALS — BP 150/80 | HR 66 | Temp 97.8°F | Ht 68.0 in | Wt 187.0 lb

## 2014-07-07 DIAGNOSIS — I10 Essential (primary) hypertension: Secondary | ICD-10-CM

## 2014-07-07 DIAGNOSIS — G43109 Migraine with aura, not intractable, without status migrainosus: Secondary | ICD-10-CM | POA: Insufficient documentation

## 2014-07-07 DIAGNOSIS — M25519 Pain in unspecified shoulder: Secondary | ICD-10-CM

## 2014-07-07 DIAGNOSIS — G43809 Other migraine, not intractable, without status migrainosus: Secondary | ICD-10-CM

## 2014-07-07 DIAGNOSIS — M25512 Pain in left shoulder: Secondary | ICD-10-CM | POA: Insufficient documentation

## 2014-07-07 DIAGNOSIS — H53139 Sudden visual loss, unspecified eye: Secondary | ICD-10-CM | POA: Insufficient documentation

## 2014-07-07 DIAGNOSIS — H53132 Sudden visual loss, left eye: Secondary | ICD-10-CM

## 2014-07-07 DIAGNOSIS — I2510 Atherosclerotic heart disease of native coronary artery without angina pectoris: Secondary | ICD-10-CM

## 2014-07-07 MED ORDER — OXYCODONE-ACETAMINOPHEN 5-325 MG PO TABS: 1.0000 | ORAL_TABLET | Freq: Two times a day (BID) | ORAL | Status: DC | PRN

## 2014-07-07 NOTE — Progress Notes (Signed)
Pre visit review using our clinic review tool, if applicable. No additional management support is needed unless otherwise documented below in the visit note. 

## 2014-07-07 NOTE — Progress Notes (Signed)
Subjective:     HPI  C/o L eye vision loss episodes since  He was 68 yo (q 2-3 years) - it would lat x 20-30 min It is usually followed by a dull HA Last episode happened on Mon this week (8/3), his L peripheral vision defect has not gone away; HA x 2 d dull, mild. He did not take any meds. No worse. Can read ok...  He had brain MRI/MRA in 2012   The patient presents for a follow-up of  chronic hypertension, chronic dyslipidemia, CAD, insomnia controlled with medicines    Review of Systems  Constitutional: Negative for appetite change, fatigue and unexpected weight change.  HENT: Negative for congestion, nosebleeds, sneezing, sore throat and trouble swallowing.   Eyes: Negative for itching and visual disturbance.  Respiratory: Negative for cough.   Cardiovascular: Negative for chest pain, palpitations and leg swelling.  Gastrointestinal: Negative for nausea, diarrhea, blood in stool and abdominal distention.  Genitourinary: Negative for frequency and hematuria.  Musculoskeletal: Negative for back pain, gait problem, joint swelling and neck pain.  Skin: Negative for rash.  Neurological: Negative for dizziness, tremors, speech difficulty and weakness.  Psychiatric/Behavioral: Negative for sleep disturbance, dysphoric mood and agitation. The patient is nervous/anxious (ED).    Wt Readings from Last 3 Encounters:  07/07/14 187 lb (84.823 kg)  03/11/14 185 lb 8 oz (84.142 kg)  12/10/13 180 lb (81.647 kg)   BP Readings from Last 3 Encounters:  07/07/14 150/80  03/11/14 130/74  12/10/13 150/90       Objective:   Physical Exam  Constitutional: He is oriented to person, place, and time. He appears well-developed.  Obese   HENT:  Mouth/Throat: Oropharynx is clear and moist.  Eyes: Conjunctivae are normal. Pupils are equal, round, and reactive to light.  Neck: Normal range of motion. No JVD present. No thyromegaly present.  Cardiovascular: Normal rate, regular rhythm, normal  heart sounds and intact distal pulses.  Exam reveals no gallop and no friction rub.   No murmur heard. Pulmonary/Chest: Effort normal and breath sounds normal. No respiratory distress. He has no wheezes. He has no rales. He exhibits no tenderness.  Abdominal: Soft. Bowel sounds are normal. He exhibits no distension and no mass. There is no tenderness. There is no rebound and no guarding.  Musculoskeletal: Normal range of motion. He exhibits no edema and no tenderness.  Lymphadenopathy:    He has no cervical adenopathy.  Neurological: He is alert and oriented to person, place, and time. He has normal reflexes. No cranial nerve deficit. He exhibits normal muscle tone. Coordination normal.  Skin: Skin is warm and dry. No rash noted.  Psychiatric: He has a normal mood and affect. His behavior is normal. Judgment and thought content normal.  pupils small B L eye: L lower outer quad - vision loss   Lab Results  Component Value Date   WBC 5.2 03/09/2014   HGB 15.1 03/09/2014   HCT 44.0 03/09/2014   PLT 130.0* 03/09/2014   GLUCOSE 103* 03/09/2014   CHOL 171 03/09/2014   TRIG 51.0 03/09/2014   HDL 45.40 03/09/2014   LDLDIRECT 296.1 08/12/2013   LDLCALC 115* 03/09/2014   ALT 27 03/09/2014   AST 25 03/09/2014   NA 139 03/09/2014   K 3.8 03/09/2014   CL 105 03/09/2014   CREATININE 1.0 03/09/2014   BUN 12 03/09/2014   CO2 29 03/09/2014   TSH 2.28 03/09/2014   PSA 0.00 Repeated and verified X2.* 03/09/2014  INR 0.99 07/31/2011   HGBA1C 5.8 03/09/2014          Assessment & Plan:

## 2014-07-07 NOTE — Assessment & Plan Note (Signed)
Since 68 yo q 2-3 years It is followed by a dull HA Last episode on Mon this week (8/3), HA x 2 d dull, mild

## 2014-07-07 NOTE — Assessment & Plan Note (Addendum)
L eye 8/15 partial - likely a CVA Ophth conult Dr Antionette Fairy Change to Plavix, brain MRI was suggested - he wants to think about it

## 2014-07-07 NOTE — Assessment & Plan Note (Signed)
7/15 MSK -  rot cuff injury Exercise Percocet prn  Potential benefits of a short term opioids use as well as potential risks (i.e. addiction risk, apnea etc) and complications (i.e. Somnolence, constipation and others) were explained to the patient and were aknowledged.

## 2014-07-07 NOTE — Assessment & Plan Note (Signed)
Continue with current prescription therapy as reflected on the Med list.  

## 2014-07-26 ENCOUNTER — Other Ambulatory Visit: Payer: Self-pay | Admitting: Internal Medicine

## 2014-07-26 DIAGNOSIS — H5347 Heteronymous bilateral field defects: Secondary | ICD-10-CM

## 2014-08-03 ENCOUNTER — Encounter: Payer: Self-pay | Admitting: Gastroenterology

## 2014-08-12 ENCOUNTER — Ambulatory Visit
Admission: RE | Admit: 2014-08-12 | Discharge: 2014-08-12 | Disposition: A | Discharge status: Home / Self Care | Payer: 59 | Source: Ambulatory Visit | Attending: Internal Medicine | Admitting: Internal Medicine

## 2014-08-12 ENCOUNTER — Ambulatory Visit
Admission: RE | Admit: 2014-08-12 | Discharge: 2014-08-12 | Disposition: A | Discharge status: Home / Self Care | Payer: Medicare Other | Source: Ambulatory Visit | Attending: Internal Medicine | Admitting: Internal Medicine

## 2014-08-12 DIAGNOSIS — H5347 Heteronymous bilateral field defects: Secondary | ICD-10-CM

## 2014-08-16 ENCOUNTER — Telehealth: Payer: Self-pay | Admitting: Internal Medicine

## 2014-08-16 DIAGNOSIS — I619 Nontraumatic intracerebral hemorrhage, unspecified: Secondary | ICD-10-CM

## 2014-08-16 DIAGNOSIS — I2510 Atherosclerotic heart disease of native coronary artery without angina pectoris: Secondary | ICD-10-CM

## 2014-08-16 MED ORDER — AMLODIPINE BESYLATE 5 MG PO TABS: 5.0000 mg | ORAL_TABLET | Freq: Every day | ORAL | Status: DC

## 2014-08-16 NOTE — Telephone Encounter (Signed)
Pt informed

## 2014-08-16 NOTE — Telephone Encounter (Signed)
I spoke w/Andrew Andrew Joseph on Monday (9/14). Re: hemorrhage: chronic HTN and ischemia would be a consideration, particularly for the new deep white matter hemorrhage. Will get a Cardiology and a Neurology consult. F/u w/Cardiology re: STENT. Will reduce ASA to 1/2 tab Monitor BP: I'll add Norvasc  9/15 Andrew Joseph,  Pls call Andrew Joseph and inform: We will get a Cardiology and a Neurology consult. Pls reduce ASA to 1/2 tab Monitor BP:add a new medication Norvasc Thx!

## 2014-08-17 ENCOUNTER — Ambulatory Visit (INDEPENDENT_AMBULATORY_CARE_PROVIDER_SITE_OTHER): Payer: Medicare Other | Admitting: Neurology

## 2014-08-17 ENCOUNTER — Encounter: Payer: Self-pay | Admitting: Neurology

## 2014-08-17 VITALS — BP 128/70 | HR 64 | Temp 98.0°F | Resp 18 | Ht 67.0 in | Wt 188.8 lb

## 2014-08-17 DIAGNOSIS — H5340 Unspecified visual field defects: Secondary | ICD-10-CM

## 2014-08-17 DIAGNOSIS — I6350 Cerebral infarction due to unspecified occlusion or stenosis of unspecified cerebral artery: Secondary | ICD-10-CM

## 2014-08-17 DIAGNOSIS — G43109 Migraine with aura, not intractable, without status migrainosus: Secondary | ICD-10-CM

## 2014-08-17 DIAGNOSIS — G43809 Other migraine, not intractable, without status migrainosus: Secondary | ICD-10-CM

## 2014-08-17 DIAGNOSIS — I10 Essential (primary) hypertension: Secondary | ICD-10-CM

## 2014-08-17 DIAGNOSIS — I619 Nontraumatic intracerebral hemorrhage, unspecified: Secondary | ICD-10-CM

## 2014-08-17 DIAGNOSIS — I639 Cerebral infarction, unspecified: Secondary | ICD-10-CM

## 2014-08-17 NOTE — Patient Instructions (Addendum)
You likely had a stroke that was not found on the MRI. 1.  Continue aspirin 2.  Blood pressure control 3.  Optimize cholesterol control 4.  Will check 2D echocardiogram and carotid doppler [facility]  08/19/14 8:45 am  [employer] A  admitting check in  They will take you vascular building and then bring you car to you when finished  5.  Mediterranean diet and exercise 6.  Follow up in 6 months.

## 2014-08-17 NOTE — Progress Notes (Addendum)
NEUROLOGY CONSULTATION NOTE  SHJON LIZARRAGA MRN: [id number] DOB: 06-Feb-1946  Referring provider: Dr. Alain Marion Primary care provider: Dr. Alain Marion  Reason for consult:  Visual field defect, hemorrhagic stroke  HISTORY OF PRESENT ILLNESS: Andrew Joseph is a 68 year old right-handed man with history of hypertension, ocular migraine, and hemorrhagic infarct who presents for vision loss .  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  He would experience kaleidoscope vision and and loss of vision involving the left side of his visual field.  It would last 15 to 20 minutes and then resolve.  They occur infrequently.  He also has a history of a hemorrhagic infarct involving the right medial temporal lobe in 2007.  In September 2012, he had an episode of loss of consciousness.  He underwent an MRI of the brain, which showed the known chronic hemorrhage in the right temporal lobe.  EEG was normal.  In July, he had another of his typical visual aura.  However, he has since had persistent vision loss in the left lower corner of his visual field.  He had seen by Dr. Annia Belt, an ophthalmologist, who found that he had left lower quadranopsia.  He underwent an MRI of the brain without contrast on 08/12/14, which revealed no acute findings, but did reveal chronic small vessel ischemic changes, the known chronic hemorrhage in the right temporal lobe, unchanged from 2012, as well as a chronic hemorrhagic focus posterior to the left lentiform nucleus, which is new compared to prior scan in 2012.  MRA of the head revealed no significant intracranial stenosis or aneurysms.  Labs from 03/09/14 include Hgb A1c 5.8, LDL 115.  PAST MEDICAL HISTORY: Past Medical History  Diagnosis Date  . CAD (coronary artery disease)     DES mid lad 08/15/2009  . Chest pain, unspecified   . Hypokalemia   . BPH (benign prostatic hyperplasia)   . HTN (hypertension)   . Hyperlipidemia   . Anxiety     . Hypercholesterolemia   . Insomnia, persistent   . Neoplasm of uncertain behavior of skin   . Blood in stool   . Allergic rhinitis, cause unspecified   . History of prostate cancer 2009    Dr [name]  . CVA (cerebrovascular accident due to intracerebral hemorrhage) 2007 Spring  . Colon polyps   . Syncope     PAST SURGICAL HISTORY: Past Surgical History  Procedure Laterality Date  . Prostatectomy  2009  . Tonsillectomy    . Angioplasty      with stents  . Colonoscopy      MEDICATIONS: Current Outpatient Prescriptions on File Prior to Visit  Medication Sig Dispense Refill  . amLODipine (NORVASC) 5 MG tablet Take 1 tablet (5 mg total) by mouth daily.  30 tablet  11  . aspirin 325 MG tablet Take 325 mg by mouth every morning.       . cholecalciferol (VITAMIN D) 1000 UNITS tablet Take 1 tablet (1,000 Units total) by mouth daily.  100 tablet  3  . ezetimibe-simvastatin (VYTORIN) 10-40 MG per tablet Take 1 tablet by mouth at bedtime.  90 tablet  3  . labetalol (NORMODYNE) 300 MG tablet Take 0.5 tablets (150 mg total) by mouth 2 (two) times daily.  90 tablet  3  . losartan-hydrochlorothiazide (HYZAAR) 100-25 MG per tablet Take 1 tablet by mouth every evening.  90 tablet  3  . Melatonin-Pyridoxine (MELATIN PO) Take 3-10 mg by mouth at bedtime as  needed (sleep).      [name] oxyCODONE-acetaminophen (ROXICET) 5-325 MG per tablet Take 1 tablet by mouth 2 (two) times daily as needed for severe pain.  60 tablet  0  . triamcinolone cream (KENALOG) 0.5 % Apply 1 application topically 3 (three) times daily.  45 g  1  . zolpidem (AMBIEN) 10 MG tablet TAKE 1/2 TABLET BY MOUTH EVERY NIGHT AT BEDTIME AS NEEDED FOR SLEEP  90 tablet  0   No current facility-administered medications on file prior to visit.    ALLERGIES: Allergies  Allergen Reactions  . Iodine     Affected my heart some how  . Shellfish Allergy Rash    Turns red    FAMILY HISTORY: Family History  Problem Relation Age of Onset  .  Stroke Mother   . Heart disease Father     MI  . Diabetes Brother     Obese    SOCIAL HISTORY: History   Social History  . Marital Status: Divorced    Spouse Name: N/A    Number of Children: 3  . Years of Education: N/A   Occupational History  . [employer] JUDGE     Semi-retired    Social History Main Topics  . Smoking status: Never Smoker   . Smokeless tobacco: Never Used  . Alcohol Use: Yes     Comment: Occasional glass of wine  . Drug Use: No  . Sexual Activity: Not Currently   Other Topics Concern  . Not on file   Social History Narrative  . No narrative on file    REVIEW OF SYSTEMS: Constitutional: No fevers, chills, or sweats, no generalized fatigue, change in appetite Eyes: No visual changes, double vision, eye pain Ear, nose and throat: No hearing loss, ear pain, nasal congestion, sore throat Cardiovascular: No chest pain, palpitations Respiratory:  No shortness of breath at rest or with exertion, wheezes GastrointestinaI: No nausea, vomiting, diarrhea, abdominal pain, fecal incontinence Genitourinary:  No dysuria, urinary retention or frequency Musculoskeletal:  Left shoulder pain Integumentary: No rash, pruritus, skin lesions Neurological: as above Psychiatric: No depression, insomnia, anxiety Endocrine: No palpitations, fatigue, diaphoresis, mood swings, change in appetite, change in weight, increased thirst Hematologic/Lymphatic:  No anemia, purpura, petechiae. Allergic/Immunologic: no itchy/runny eyes, nasal congestion, recent allergic reactions, rashes  PHYSICAL EXAM: Filed Vitals:   08/17/14 1014  BP: 128/70  Pulse: 64  Temp: 98 F (36.7 C)  Resp: 18   General: No acute distress Head:  Normocephalic/atraumatic Neck: supple, no paraspinal tenderness, full range of motion Back: No paraspinal tenderness Heart: regular rate and rhythm Lungs: Clear to auscultation bilaterally. Vascular: No carotid bruits. Neurological Exam: Mental status:  alert and oriented to person, place, and time, recent and remote memory intact, fund of knowledge intact, attention and concentration intact, speech fluent and not dysarthric, language intact. Cranial nerves: CN I: not tested CN II: pupils equal, round and reactive to light, left lower quandranopsia, fundi unremarkable, without vessel changes, exudates, hemorrhages or papilledema. CN III, IV, VI:  full range of motion, no nystagmus, no ptosis CN V: facial sensation intact CN VII: upper and lower face symmetric CN VIII: hearing intact CN IX, X: gag intact, uvula midline CN XI: sternocleidomastoid and trapezius muscles intact CN XII: tongue midline Bulk & Tone: normal, no fasciculations. Motor: 5/5 throughout except 5-/5 in left shoulder abduction limited due to pain. Sensation: temperature and vibration intact Deep Tendon Reflexes: 2+ throughout, toes downgoing. Finger to nose testing: no dysmetria Heel to  shin: no dysmetria Gait: normal station and stride, able to turn and walk in tandem. Romberg negative.  IMPRESSION: Left lower quandranopsia.  It could be a persistent aura without infarction, but could not rule out ischemic stroke involving the right parietal lobe, probably no longer seen on MRI or could be  Chronic right medial temporal hemorrhagic infarct, stable Chronic small hemorrhagic focus in left hemispheric deep white matter, likely related to hypertension, incidental finding Visual aura without headache  PLAN: 1.  For secondary stroke prevention, he will remain on ASA daily.  Switching to Plavix is a lateral change but there is increased risk for bleeding, so he wishes to remain on ASA. 2.  Optimize cholesterol (LDL goal should be less than 100) 3.  Optimize blood pressure control 4.  2D Echo and carotid duplex 5.  Mediterranean diet and exercise 6.  Follow up in 6 weeks  Thank you for allowing me to take part in the care of this patient.  Metta Clines, DO  CC:  Lew Dawes, MD

## 2014-08-19 ENCOUNTER — Other Ambulatory Visit (HOSPITAL_COMMUNITY): Payer: Self-pay | Admitting: Neurology

## 2014-08-19 ENCOUNTER — Ambulatory Visit (HOSPITAL_COMMUNITY)
Admission: RE | Admit: 2014-08-19 | Discharge: 2014-08-19 | Disposition: A | Discharge status: Home / Self Care | Payer: Medicare Other | Source: Ambulatory Visit | Attending: Neurology | Admitting: Neurology

## 2014-08-19 ENCOUNTER — Telehealth: Payer: Self-pay | Admitting: *Deleted

## 2014-08-19 DIAGNOSIS — Z8673 Personal history of transient ischemic attack (TIA), and cerebral infarction without residual deficits: Secondary | ICD-10-CM | POA: Insufficient documentation

## 2014-08-19 DIAGNOSIS — E785 Hyperlipidemia, unspecified: Secondary | ICD-10-CM | POA: Diagnosis not present

## 2014-08-19 DIAGNOSIS — I6529 Occlusion and stenosis of unspecified carotid artery: Secondary | ICD-10-CM | POA: Insufficient documentation

## 2014-08-19 DIAGNOSIS — I619 Nontraumatic intracerebral hemorrhage, unspecified: Secondary | ICD-10-CM

## 2014-08-19 DIAGNOSIS — I252 Old myocardial infarction: Secondary | ICD-10-CM | POA: Insufficient documentation

## 2014-08-19 DIAGNOSIS — I6350 Cerebral infarction due to unspecified occlusion or stenosis of unspecified cerebral artery: Secondary | ICD-10-CM | POA: Insufficient documentation

## 2014-08-19 DIAGNOSIS — I10 Essential (primary) hypertension: Secondary | ICD-10-CM | POA: Insufficient documentation

## 2014-08-19 DIAGNOSIS — G43109 Migraine with aura, not intractable, without status migrainosus: Secondary | ICD-10-CM

## 2014-08-19 DIAGNOSIS — H539 Unspecified visual disturbance: Secondary | ICD-10-CM | POA: Diagnosis not present

## 2014-08-19 DIAGNOSIS — I517 Cardiomegaly: Secondary | ICD-10-CM

## 2014-08-19 DIAGNOSIS — I639 Cerebral infarction, unspecified: Secondary | ICD-10-CM

## 2014-08-19 DIAGNOSIS — Z79899 Other long term (current) drug therapy: Secondary | ICD-10-CM | POA: Insufficient documentation

## 2014-08-19 DIAGNOSIS — G43809 Other migraine, not intractable, without status migrainosus: Secondary | ICD-10-CM | POA: Diagnosis present

## 2014-08-19 DIAGNOSIS — I2510 Atherosclerotic heart disease of native coronary artery without angina pectoris: Secondary | ICD-10-CM | POA: Insufficient documentation

## 2014-08-19 DIAGNOSIS — H5310 Unspecified subjective visual disturbances: Secondary | ICD-10-CM

## 2014-08-19 NOTE — Telephone Encounter (Signed)
Patient is aware of normal 2D ECHO

## 2014-08-19 NOTE — Progress Notes (Signed)
Echocardiogram 2D Echocardiogram has been performed.  Andrew Joseph 08/19/2014, 11:20 AM

## 2014-08-19 NOTE — Telephone Encounter (Signed)
Message copied by Claudie Revering on Fri Aug 19, 2014  3:54 PM ------      Message from: JAFFE, ADAM R      Created: Fri Aug 19, 2014  3:31 PM       2D echo okay      ----- Message -----         From: [facility]         Sent: 08/19/2014   2:58 PM           To: Dudley Major, DO                   ------

## 2014-08-19 NOTE — Progress Notes (Signed)
*  PRELIMINARY RESULTS* Vascular Ultrasound Carotid Duplex (Doppler) has been completed.  Preliminary findings: Bilateral:  1-39% ICA stenosis.  Vertebral artery flow is antegrade.      Landry Mellow, RDMS, RVT  08/19/2014, 12:39 PM

## 2014-08-23 ENCOUNTER — Telehealth: Payer: Self-pay | Admitting: *Deleted

## 2014-08-23 NOTE — Telephone Encounter (Signed)
Patient is aware of normal doppler

## 2014-08-23 NOTE — Telephone Encounter (Signed)
Message copied by Claudie Revering on Tue Aug 23, 2014 10:43 AM ------      Message from: JAFFE, ADAM R      Created: Mon Aug 22, 2014  6:47 AM       Carotid dopplers look okay      ----- Message -----         From: [facility] Interface         Sent: 08/21/2014  11:04 AM           To: Dudley Major, DO                   ------

## 2014-08-27 ENCOUNTER — Encounter: Payer: Self-pay | Admitting: Gastroenterology

## 2014-08-31 ENCOUNTER — Ambulatory Visit (INDEPENDENT_AMBULATORY_CARE_PROVIDER_SITE_OTHER): Payer: Medicare Other | Admitting: Physician Assistant

## 2014-08-31 ENCOUNTER — Encounter: Payer: Self-pay | Admitting: Physician Assistant

## 2014-08-31 VITALS — BP 140/72 | HR 84 | Temp 98.8°F | Resp 18 | Wt 180.4 lb

## 2014-08-31 DIAGNOSIS — J45901 Unspecified asthma with (acute) exacerbation: Secondary | ICD-10-CM

## 2014-08-31 MED ORDER — PREDNISONE 20 MG PO TABS: ORAL_TABLET | ORAL | Status: DC

## 2014-08-31 MED ORDER — AZITHROMYCIN 250 MG PO TABS: ORAL_TABLET | ORAL | Status: DC

## 2014-08-31 NOTE — Progress Notes (Signed)
Subjective:    Patient ID: Andrew Joseph, male    DOB: 1946-08-06, 68 y.o.   MRN: [id number]  Cough This is a new problem. The current episode started 1 to 4 weeks ago (9 days). The problem has been gradually worsening. The problem occurs constantly. The cough is productive of sputum. Associated symptoms include chills, a fever, nasal congestion, postnasal drip, a sore throat and wheezing. Pertinent negatives include no chest pain, ear congestion, ear pain, headaches, heartburn, hemoptysis, myalgias, rash, rhinorrhea, shortness of breath, sweats or weight loss. Nothing aggravates the symptoms. Treatments tried: went to a family practice last week, was given codeine cough syrup, nasal steroid spray, aspirin. The treatment provided mild relief. His past medical history is significant for environmental allergies. There is no history of asthma or COPD.      Review of Systems  Constitutional: Positive for fever and chills. Negative for weight loss.  HENT: Positive for postnasal drip and sore throat. Negative for ear pain, rhinorrhea and sinus pressure.   Respiratory: Positive for cough and wheezing. Negative for hemoptysis and shortness of breath.   Cardiovascular: Negative for chest pain.  Gastrointestinal: Negative for heartburn, nausea, vomiting and diarrhea.  Musculoskeletal: Negative for myalgias.  Skin: Negative for rash.  Allergic/Immunologic: Positive for environmental allergies.  Neurological: Negative for syncope and headaches.  All other systems reviewed and are negative.    Past Medical History  Diagnosis Date  . CAD (coronary artery disease)     DES mid lad 08/15/2009  . Chest pain, unspecified   . Hypokalemia   . BPH (benign prostatic hyperplasia)   . HTN (hypertension)   . Hyperlipidemia   . Anxiety   . Hypercholesterolemia   . Insomnia, persistent   . Neoplasm of uncertain behavior of skin   . Blood in stool   . Allergic rhinitis, cause unspecified   . History of  prostate cancer 2009    Dr [name]  . CVA (cerebrovascular accident due to intracerebral hemorrhage) 2007 Spring  . Colon polyps   . Syncope     History   Social History  . Marital Status: Divorced    Spouse Name: N/A    Number of Children: 3  . Years of Education: N/A   Occupational History  . [employer] JUDGE     Semi-retired    Social History Main Topics  . Smoking status: Never Smoker   . Smokeless tobacco: Never Used  . Alcohol Use: Yes     Comment: Occasional glass of wine  . Drug Use: No  . Sexual Activity: Not Currently   Other Topics Concern  . Not on file   Social History Narrative  . No narrative on file    Past Surgical History  Procedure Laterality Date  . Prostatectomy  2009  . Tonsillectomy    . Angioplasty      with stents  . Colonoscopy      Family History  Problem Relation Age of Onset  . Stroke Mother   . Heart disease Father     MI  . Diabetes Brother     Obese    Allergies  Allergen Reactions  . Iodine     Affected my heart some how  . Shellfish Allergy Rash    Turns red    Current Outpatient Prescriptions on File Prior to Visit  Medication Sig Dispense Refill  . amLODipine (NORVASC) 5 MG tablet Take 1 tablet (5 mg total) by mouth daily.  30 tablet  11  . aspirin 325 MG tablet Take 325 mg by mouth every morning.       . cholecalciferol (VITAMIN D) 1000 UNITS tablet Take 1 tablet (1,000 Units total) by mouth daily.  100 tablet  3  . ezetimibe-simvastatin (VYTORIN) 10-40 MG per tablet Take 1 tablet by mouth at bedtime.  90 tablet  3  . labetalol (NORMODYNE) 300 MG tablet Take 0.5 tablets (150 mg total) by mouth 2 (two) times daily.  90 tablet  3  . losartan-hydrochlorothiazide (HYZAAR) 100-25 MG per tablet Take 1 tablet by mouth every evening.  90 tablet  3  . Melatonin-Pyridoxine (MELATIN PO) Take 3-10 mg by mouth at bedtime as needed (sleep).      [name] oxyCODONE-acetaminophen (ROXICET) 5-325 MG per tablet Take 1 tablet by mouth 2  (two) times daily as needed for severe pain.  60 tablet  0  . triamcinolone cream (KENALOG) 0.5 % Apply 1 application topically 3 (three) times daily.  45 g  1  . zolpidem (AMBIEN) 10 MG tablet TAKE 1/2 TABLET BY MOUTH EVERY NIGHT AT BEDTIME AS NEEDED FOR SLEEP  90 tablet  0   No current facility-administered medications on file prior to visit.    EXAM: BP 140/72  Pulse 84  Temp(Src) 98.8 F (37.1 C) (Oral)  Resp 18  Wt 180 lb 6.4 oz (81.829 kg)  SpO2 96%     Objective:   Physical Exam  Nursing note and vitals reviewed. Constitutional: He is oriented to person, place, and time. He appears well-developed and well-nourished. No distress.  HENT:  Head: Normocephalic and atraumatic.  Right Ear: External ear normal.  Left Ear: External ear normal.  Nose: Nose normal.  Mouth/Throat: No oropharyngeal exudate.  Oropharynx is slightly erythematous, no exudate. Bilateral TMs normal. Bilateral frontal and maxillary sinuses non-TTP.  Eyes: Conjunctivae and EOM are normal. Pupils are equal, round, and reactive to light.  Neck: Normal range of motion. Neck supple.  Cardiovascular: Normal rate, regular rhythm and intact distal pulses.   Pulmonary/Chest: Effort normal. No stridor. No respiratory distress. He has wheezes. He has no rales. He exhibits no tenderness.  Lymphadenopathy:    He has no cervical adenopathy.  Neurological: He is alert and oriented to person, place, and time.  Skin: Skin is warm and dry. No rash noted. He is not diaphoretic. No erythema. No pallor.  Psychiatric: He has a normal mood and affect. His behavior is normal. Judgment and thought content normal.    Lab Results  Component Value Date   WBC 5.2 03/09/2014   HGB 15.1 03/09/2014   HCT 44.0 03/09/2014   PLT 130.0* 03/09/2014   GLUCOSE 103* 03/09/2014   CHOL 171 03/09/2014   TRIG 51.0 03/09/2014   HDL 45.40 03/09/2014   LDLDIRECT 296.1 08/12/2013   LDLCALC 115* 03/09/2014   ALT 27 03/09/2014   AST 25 03/09/2014   NA 139  03/09/2014   K 3.8 03/09/2014   CL 105 03/09/2014   CREATININE 1.0 03/09/2014   BUN 12 03/09/2014   CO2 29 03/09/2014   TSH 2.28 03/09/2014   PSA 0.00 Repeated and verified X2.* 03/09/2014   INR 0.99 07/31/2011   HGBA1C 5.8 03/09/2014         Assessment & Plan:  Yordy was seen today for sore throat.  Diagnoses and associated orders for this visit:  Asthmatic bronchitis, unspecified asthma severity, with acute exacerbation Comments: No help on symptomatic treatment. Rx Z-pak and prednisone for wheezing. continue mucinex, nasal  steroid, antihistamine, rest, push fluids. Watchful waiting. - azithromycin (ZITHROMAX) 250 MG tablet; 2 pills on day one, then 1 pill on days 2 through 5. - predniSONE (DELTASONE) 20 MG tablet; 3 tablets daily for 3 days, 2 tablets daily for 3 days, one tablet daily for 3 days.   As pt is wheezing, will provide prednisone taper, encouraged pt to take each dose with food to prevent nausea. Also, rx azithromycin as pt is still ill despite being treated conservatively at another office last week. Pt is amenable to this plan.   Return precautions provided, and patient handout on cough.  Plan to follow up as needed, or for worsening or persistent symptoms despite treatment.  Patient Instructions  Prednisone taper to help with wheezing.  Azithromycin 2 pills today, then 1 pill daily for the next 4 days.  Plain Over the Counter Mucinex (NOT Mucinex D) for thick secretions  Force NON dairy fluids, drinking plenty of water is best.    Over the Counter Flonase OR Nasacort AQ 1 spray in each nostril twice a day as needed. Use the "crossover" technique into opposite nostril spraying toward opposite ear @ 45 degree angle, not straight up into nostril.   Plain Over the Counter Allegra (NOT D )  160 daily , OR Loratidine 10 mg , OR Zyrtec 10 mg @ bedtime  as needed for itchy eyes & sneezing.  Saline Irrigation and Saline Sprays can also help reduce symptoms.  If emergency  symptoms discussed during visit developed, seek medical attention immediately.  Followup as needed, or for worsening or persistent symptoms despite treatment.

## 2014-08-31 NOTE — Patient Instructions (Addendum)
Prednisone taper to help with wheezing.  Azithromycin 2 pills today, then 1 pill daily for the next 4 days.  Plain Over the Counter Mucinex (NOT Mucinex D) for thick secretions  Force NON dairy fluids, drinking plenty of water is best.    Over the Counter Flonase OR Nasacort AQ 1 spray in each nostril twice a day as needed. Use the "crossover" technique into opposite nostril spraying toward opposite ear @ 45 degree angle, not straight up into nostril.   Plain Over the Counter Allegra (NOT D )  160 daily , OR Loratidine 10 mg , OR Zyrtec 10 mg @ bedtime  as needed for itchy eyes & sneezing.  Saline Irrigation and Saline Sprays can also help reduce symptoms.  If emergency symptoms discussed during visit developed, seek medical attention immediately.  Followup as needed, or for worsening or persistent symptoms despite treatment.   Cough, Adult  A cough is a reflex. It helps you clear your throat and airways. A cough can help heal your body. A cough can last 2 or 3 weeks (acute) or may last more than 8 weeks (chronic). Some common causes of a cough can include an infection, allergy, or a cold. HOME CARE  Only take medicine as told by your doctor.  If given, take your medicines (antibiotics) as told. Finish them even if you start to feel better.  Use a cold steam vaporizer or humidifier in your home. This can help loosen thick spit (secretions).  Sleep so you are almost sitting up (semi-upright). Use pillows to do this. This helps reduce coughing.  Rest as needed.  Stop smoking if you smoke. GET HELP RIGHT AWAY IF:  You have yellowish-white fluid (pus) in your thick spit.  Your cough gets worse.  Your medicine does not reduce coughing, and you are losing sleep.  You cough up blood.  You have trouble breathing.  Your pain gets worse and medicine does not help.  You have a fever. MAKE SURE YOU:   Understand these instructions.  Will watch your condition.  Will get help  right away if you are not doing well or get worse. Document Released: 08/01/2011 Document Revised: 04/04/2014 Document Reviewed: 08/01/2011 [facility] Patient Information 2015 [place], [place]. This information is not intended to replace advice given to you by your health care provider. Make sure you discuss any questions you have with your health care provider.

## 2014-08-31 NOTE — Progress Notes (Signed)
Pre visit review using our clinic review tool, if applicable. No additional management support is needed unless otherwise documented below in the visit note. 

## 2014-09-14 ENCOUNTER — Telehealth: Payer: Self-pay | Admitting: Internal Medicine

## 2014-09-14 NOTE — Telephone Encounter (Signed)
Received call from Young (linda ) that pt cancelled that he had on 09/19/14 said that he didn't think he needed appt.

## 2014-09-15 NOTE — Telephone Encounter (Signed)
Noted. Thx.

## 2014-09-19 ENCOUNTER — Ambulatory Visit: Payer: Medicare Other | Admitting: Cardiovascular Disease

## 2014-10-11 ENCOUNTER — Telehealth: Payer: Self-pay | Admitting: Internal Medicine

## 2014-10-11 ENCOUNTER — Encounter: Payer: Self-pay | Admitting: Internal Medicine

## 2014-10-11 ENCOUNTER — Ambulatory Visit (INDEPENDENT_AMBULATORY_CARE_PROVIDER_SITE_OTHER): Payer: Medicare Other | Admitting: Internal Medicine

## 2014-10-11 ENCOUNTER — Ambulatory Visit: Payer: Medicare Other | Admitting: Family

## 2014-10-11 VITALS — BP 140/82 | HR 84 | Temp 98.3°F | Ht 68.0 in | Wt 183.1 lb

## 2014-10-11 DIAGNOSIS — B370 Candidal stomatitis: Secondary | ICD-10-CM | POA: Insufficient documentation

## 2014-10-11 DIAGNOSIS — R059 Cough, unspecified: Secondary | ICD-10-CM

## 2014-10-11 DIAGNOSIS — J3089 Other allergic rhinitis: Secondary | ICD-10-CM

## 2014-10-11 DIAGNOSIS — R05 Cough: Secondary | ICD-10-CM

## 2014-10-11 MED ORDER — NYSTATIN 100000 UNIT/ML MT SUSP: 500000.0000 [IU] | Freq: Four times a day (QID) | OROMUCOSAL | Status: DC

## 2014-10-11 MED ORDER — DESLORATADINE 5 MG PO TABS: 5.0000 mg | ORAL_TABLET | Freq: Every day | ORAL | Status: DC

## 2014-10-11 MED ORDER — METHYLPREDNISOLONE ACETATE 80 MG/ML IJ SUSP
120.0000 mg | Freq: Once | INTRAMUSCULAR | Status: AC
  Administered 2014-10-11: 120 mg via INTRAMUSCULAR

## 2014-10-11 MED ORDER — FLUTICASONE PROPIONATE 50 MCG/ACT NA SUSP: 2.0000 | Freq: Every day | NASAL | Status: DC

## 2014-10-11 MED ORDER — PREDNISONE 10 MG PO TABS: ORAL_TABLET | ORAL | Status: DC

## 2014-10-11 NOTE — Telephone Encounter (Signed)
This is now done

## 2014-10-11 NOTE — Progress Notes (Signed)
Pre visit review using our clinic review tool, if applicable. No additional management support is needed unless otherwise documented below in the visit note. 

## 2014-10-11 NOTE — Progress Notes (Signed)
Subjective:    Patient ID: Andrew Joseph, male    DOB: 11/10/1946, 68 y.o.   MRN: [id number]  HPI  Here to f/u, has been at home/ill for 6 wks, worse in past 3-4 wks, with cough prod with offcolored mucous, ongoing cough with pain to right mid back,  Also with URI type symtpoms as well.  Cough med with codeine, mucinex has helped somewhat.  Had zpack per FP in [place] last wk, no help. Pt denies chest pain, increased sob or doe, wheezing, orthopnea, PND, increased LE swelling, palpitations, dizziness or syncope, other than above Past Medical History  Diagnosis Date  . CAD (coronary artery disease)     DES mid lad 08/15/2009  . Chest pain, unspecified   . Hypokalemia   . BPH (benign prostatic hyperplasia)   . HTN (hypertension)   . Hyperlipidemia   . Anxiety   . Hypercholesterolemia   . Insomnia, persistent   . Neoplasm of uncertain behavior of skin   . Blood in stool   . Allergic rhinitis, cause unspecified   . History of prostate cancer 2009    Dr [name]  . CVA (cerebrovascular accident due to intracerebral hemorrhage) 2007 [place]  . Colon polyps   . Syncope    Past Surgical History  Procedure Laterality Date  . Prostatectomy  2009  . Tonsillectomy    . Angioplasty      with stents  . Colonoscopy      reports that he has never smoked. He has never used smokeless tobacco. He reports that he drinks alcohol. He reports that he does not use illicit drugs. family history includes Diabetes in his brother; Heart disease in his father; Stroke in his mother. Allergies  Allergen Reactions  . Iodine     Affected my heart some how  . Shellfish Allergy Rash    Turns red   Current Outpatient Prescriptions on File Prior to Visit  Medication Sig Dispense Refill  . amLODipine (NORVASC) 5 MG tablet Take 1 tablet (5 mg total) by mouth daily. 30 tablet 11  . aspirin 325 MG tablet Take 325 mg by mouth every morning.     . cholecalciferol (VITAMIN D) 1000 UNITS tablet Take 1 tablet  (1,000 Units total) by mouth daily. 100 tablet 3  . ezetimibe-simvastatin (VYTORIN) 10-40 MG per tablet Take 1 tablet by mouth at bedtime. 90 tablet 3  . labetalol (NORMODYNE) 300 MG tablet Take 0.5 tablets (150 mg total) by mouth 2 (two) times daily. 90 tablet 3  . losartan-hydrochlorothiazide (HYZAAR) 100-25 MG per tablet Take 1 tablet by mouth every evening. 90 tablet 3  . Melatonin-Pyridoxine (MELATIN PO) Take 3-10 mg by mouth at bedtime as needed (sleep).    [name] oxyCODONE-acetaminophen (ROXICET) 5-325 MG per tablet Take 1 tablet by mouth 2 (two) times daily as needed for severe pain. 60 tablet 0  . triamcinolone cream (KENALOG) 0.5 % Apply 1 application topically 3 (three) times daily. 45 g 1  . zolpidem (AMBIEN) 10 MG tablet TAKE 1/2 TABLET BY MOUTH EVERY NIGHT AT BEDTIME AS NEEDED FOR SLEEP 90 tablet 0   No current facility-administered medications on file prior to visit.   Review of Systems  Constitutional: Negative for unusual diaphoresis or other sweats  HENT: Negative for ringing in ear Eyes: Negative for double vision or worsening visual disturbance.  Respiratory: Negative for choking and stridor.   Gastrointestinal: Negative for vomiting or other signifcant bowel change Genitourinary: Negative for hematuria or decreased  urine volume.  Musculoskeletal: Negative for other MSK pain or swelling Skin: Negative for color change and worsening wound.  Neurological: Negative for tremors and numbness other than noted  Psychiatric/Behavioral: Negative for decreased concentration or agitation other than above       Objective:   Physical Exam BP 140/82 mmHg  Pulse 84  Temp(Src) 98.3 F (36.8 C) (Oral)  Ht 5\' 8"  (1.727 m)  Wt 183 lb 2 oz (83.065 kg)  BMI 27.85 kg/m2  SpO2 94% VS noted,  Constitutional: Pt appears well-developed, well-nourished.  HENT: Head: NCAT.  Right Ear: External ear normal.  Left Ear: External ear normal.  Eyes: . Pupils are equal, round, and reactive to  light. Conjunctivae and EOM are normal Neck: Normal range of motion. Neck supple.  Right tm normal, left tm's with mild erythema.  Max sinus areas non tender.  Pharynx with mild erythema, no exudate + thrush to tongue Cardiovascular: Normal rate and regular rhythm.   Pulmonary/Chest: Effort normal and breath sounds normal.  - no rales or wheezing Neurological: Pt is alert. Not confused , motor grossly intact Skin: Skin is warm. No rash Psychiatric: Pt behavior is normal. No agitation.      Assessment & Plan:

## 2014-10-11 NOTE — Patient Instructions (Signed)
You had the steroid shot today  Please take all new medication as prescribed   - the prednisone, the zyrtec and the flonase, as well as the solution to take for the thrush (nystatin)  Please go to the XRAY Department in the Basement (go straight as you get off the elevator) for the x-ray testing at your convenience (tomorrow or after)  You will be contacted by phone if any changes need to be made immediately.  Otherwise, you will receive a letter about your results with an explanation, but please check with MyChart first.  Please remember to sign up for MyChart if you have not done so, as this will be important to you in the future with finding out test results, communicating by private email, and scheduling acute appointments online when needed.

## 2014-10-11 NOTE — Telephone Encounter (Signed)
-----   Message from [place] sent at 10/11/2014  5:34 PM EST ----- Please refer to an allergist is not already done so

## 2014-10-13 ENCOUNTER — Telehealth: Payer: Self-pay | Admitting: Internal Medicine

## 2014-10-13 ENCOUNTER — Ambulatory Visit (INDEPENDENT_AMBULATORY_CARE_PROVIDER_SITE_OTHER)
Admission: RE | Admit: 2014-10-13 | Discharge: 2014-10-13 | Disposition: A | Discharge status: Home / Self Care | Payer: Medicare Other | Source: Ambulatory Visit | Attending: Internal Medicine | Admitting: Internal Medicine

## 2014-10-13 ENCOUNTER — Other Ambulatory Visit: Payer: Self-pay | Admitting: Internal Medicine

## 2014-10-13 ENCOUNTER — Encounter: Payer: Self-pay | Admitting: Internal Medicine

## 2014-10-13 ENCOUNTER — Encounter: Payer: Self-pay | Admitting: Gastroenterology

## 2014-10-13 DIAGNOSIS — R05 Cough: Secondary | ICD-10-CM

## 2014-10-13 NOTE — Telephone Encounter (Signed)
Very sorry, I was seeing pt for an acute visit.  There was no blood work needed based on his complaints at the time.  Please ask pt to f/u with PCP - Dr Alain Marion for health maintenance and need for ongoing lab testing.

## 2014-10-13 NOTE — Assessment & Plan Note (Signed)
Mild to mod, for prednisone asd, zyrtec/flonase,  to f/u any worsening symptoms or concerns

## 2014-10-13 NOTE — Assessment & Plan Note (Signed)
Mild to mod, for nystatin soln asd,  to f/u any worsening symptoms or concerns 

## 2014-10-13 NOTE — Telephone Encounter (Signed)
Pt stated he forgot to ask for blood work when he was in to see doctor Jenny Reichmann 10/11/14. Pt stated him and Jenny Reichmann talk about it but he didn't ask anything else before he left. Please advise, pt is coming to get the chest x-ray done today at noon and he was wondering if Dr. Jenny Reichmann can order blood work before he come (he is talking about cholesterol and other stuff).

## 2014-10-13 NOTE — Assessment & Plan Note (Addendum)
C/w prob post nasal gtt, no s/s infection, Mild to mod, for cxr, o/w  to f/u any worsening symptoms or concerns

## 2014-10-13 NOTE — Telephone Encounter (Signed)
Patient informed. 

## 2014-12-02 HISTORY — PX: COLONOSCOPY: SHX174

## 2014-12-08 ENCOUNTER — Ambulatory Visit (AMBULATORY_SURGERY_CENTER): Payer: Self-pay | Admitting: *Deleted

## 2014-12-08 VITALS — Ht 68.0 in | Wt 189.8 lb

## 2014-12-08 DIAGNOSIS — Z86010 Personal history of colonic polyps: Secondary | ICD-10-CM

## 2014-12-08 MED ORDER — MOVIPREP 100 G PO SOLR: ORAL | Status: DC

## 2014-12-08 NOTE — Progress Notes (Signed)
No egg or soy allergy  No anesthesia or intubation problems per pt  No diet medications taken  Registered in [place]  At pt's PV, writer was explaining consent form.  When I was explaining the hemorrhoidal injection, pt started raising his voice at me.  He states, "For once in my life I am not going to sign something without knowing what I am signing."  Writer explained that he didn't have to sign anything without reading form first and having any questions answered. I told patient to read the explanation provided on the consent form.  For approximately 10 minutes, pt goes on to state that he wasn't going to initial that part of the form.  I attempted during that time to tell him that he did not have to initial or sign anything he doesn't feel comfortable doing.  He will stop talking long enough for me to tell him that.  Finally, I suggested that pt speak with Dr. Fuller Plan before the procedure about hemorrhoidal injections.  At that time, if he feels comfortable to have this done, if necessary, he can initial form.  If not, he doesn't have to.  Patient agrees to this.

## 2014-12-09 ENCOUNTER — Telehealth: Payer: Self-pay | Admitting: Gastroenterology

## 2014-12-09 NOTE — Telephone Encounter (Signed)
We only consider injection if pt is having significant internal hemorrhoidal symptoms. He saw Dr. Carlean Purl for 2 visits to consider hemorrhoidal banding and he did not need banding. If he is having hemorrhoidal symptoms that are not controlled with creams or supp then he should have an office visit with me to discuss options. If he just wants colonoscopy without hemorrhoidal injection he can do so and does not need to sign consent for hemorrhoidal injection.

## 2014-12-09 NOTE — Telephone Encounter (Signed)
Patient calling back today as follow up from pre-visit yesterday. He has many questions and concerns about the "hemorrhoidal injection" procedure on the consent form. He does not want to ask me or the PA, he wants to speak "directly" to you. He was told that he would be able to see you in procedure room before the colonoscopy but he does not want to be rushed to make a decision on the spot then. Patient was told he could have office visit with you and he was ok with that but unable to make this appointment before his colonoscopy on 12/20/14, and he does not want to reschedule the colonoscopy and would like for you to call him. He states he only wants 5 minutes of your time. Below is the pre-visit summary from [facility] yesterday....  12/08/14: At pt's PV, writer was explaining consent form. When I was explaining the hemorrhoidal injection, pt started raising his voice at me. He states, "For once in my life I am not going to sign something without knowing what I am signing." Writer explained that he didn't have to sign anything without reading form first and having any questions answered. I told patient to read the explanation provided on the consent form. For approximately 10 minutes, pt goes on to state that he wasn't going to initial that part of the form. I attempted during that time to tell him that he did not have to initial or sign anything he doesn't feel comfortable doing. He will stop talking long enough for me to tell him that. Finally, I suggested that pt speak with Dr. Fuller Plan before the procedure about hemorrhoidal injections. At that time, if he feels comfortable to have this done, if necessary, he can initial form. If not, he doesn't have to. Patient agrees to this.

## 2014-12-09 NOTE — Telephone Encounter (Signed)
Made office visit with Dr.Stark on Wednesday, January 13,2016 at 2:15 pm. Patient notified and was pleased.

## 2014-12-09 NOTE — Telephone Encounter (Signed)
Called patient, no answer, left message. Will try again.

## 2014-12-14 ENCOUNTER — Encounter: Payer: Self-pay | Admitting: Gastroenterology

## 2014-12-14 ENCOUNTER — Ambulatory Visit (INDEPENDENT_AMBULATORY_CARE_PROVIDER_SITE_OTHER): Payer: Medicare Other | Admitting: Gastroenterology

## 2014-12-14 VITALS — BP 130/60 | HR 78 | Ht 68.0 in | Wt 186.6 lb

## 2014-12-14 DIAGNOSIS — Z860101 Personal history of adenomatous and serrated colon polyps: Secondary | ICD-10-CM

## 2014-12-14 DIAGNOSIS — Z86010 Personal history of colonic polyps: Secondary | ICD-10-CM

## 2014-12-14 DIAGNOSIS — K648 Other hemorrhoids: Secondary | ICD-10-CM

## 2014-12-14 NOTE — Patient Instructions (Signed)
Keep appointment for your Colonoscopy with Dr. Fuller Plan.

## 2014-12-14 NOTE — Progress Notes (Signed)
    History of Present Illness: This is a 68 year male with a history of adenomatous colon polyps who is due for colonoscopy. He has a history of internal hemorrhoids but has not had symptoms since 2014. He is concerned about signing the consent for hemorrhoidal treatment. Previously evaluated by Dr. Carlean Purl for possible hemorrhoidal banding in 2014 however his symptoms resolve with stool softeners and HC cream.  Current Medications, Allergies, Past Medical History, Past Surgical History, Family History and Social History were reviewed in [employer] record.  Physical Exam: General: Well developed , well nourished, no acute distress Head: Normocephalic and atraumatic Eyes:  sclerae anicteric, EOMI Ears: Normal auditory acuity Mouth: No deformity or lesions Lungs: Clear throughout to auscultation Heart: Regular rate and rhythm; no murmurs, rubs or bruits Abdomen: Soft, non tender and non distended. No masses, hepatosplenomegaly or hernias noted. Normal Bowel sounds Rectal: deferred to colonoscopy Musculoskeletal: Symmetrical with no gross deformities  Pulses:  Normal pulses noted Extremities: No clubbing, cyanosis, edema or deformities noted Neurological: Alert oriented x 4, grossly nonfocal Psychological:  Alert and cooperative. Normal mood and affect  Assessment and Recommendations:  1. Personal history of adenomatous colon polyps due for surveillance colonoscopy. The risks, benefits, and alternatives to colonoscopy with possible biopsy and possible polypectomy were discussed with the patient and they consent to proceed.   2. Internal hemorrhoids, currently asymptomatic. He does not need and does not desire any hemorrhoidal treatment.

## 2014-12-20 ENCOUNTER — Ambulatory Visit (AMBULATORY_SURGERY_CENTER): Payer: Medicare Other | Admitting: Gastroenterology

## 2014-12-20 ENCOUNTER — Encounter: Payer: Self-pay | Admitting: Gastroenterology

## 2014-12-20 VITALS — BP 143/73 | HR 58 | Temp 98.0°F | Resp 18 | Ht 68.0 in | Wt 186.0 lb

## 2014-12-20 DIAGNOSIS — D128 Benign neoplasm of rectum: Secondary | ICD-10-CM

## 2014-12-20 DIAGNOSIS — D129 Benign neoplasm of anus and anal canal: Secondary | ICD-10-CM

## 2014-12-20 DIAGNOSIS — Z86010 Personal history of colonic polyps: Secondary | ICD-10-CM

## 2014-12-20 DIAGNOSIS — D124 Benign neoplasm of descending colon: Secondary | ICD-10-CM

## 2014-12-20 DIAGNOSIS — D123 Benign neoplasm of transverse colon: Secondary | ICD-10-CM

## 2014-12-20 DIAGNOSIS — K621 Rectal polyp: Secondary | ICD-10-CM

## 2014-12-20 DIAGNOSIS — K629 Disease of anus and rectum, unspecified: Secondary | ICD-10-CM

## 2014-12-20 MED ORDER — SODIUM CHLORIDE 0.9 % IV SOLN: 500.0000 mL | INTRAVENOUS | Status: DC

## 2014-12-20 NOTE — Progress Notes (Signed)
Called to room to assist during endoscopic procedure.  Patient ID and intended procedure confirmed with present staff. Received instructions for my participation in the procedure from the performing physician.  

## 2014-12-20 NOTE — Progress Notes (Signed)
Procedure ends, to recovery, report given and VSS. 

## 2014-12-20 NOTE — Patient Instructions (Signed)

## 2014-12-20 NOTE — Op Note (Addendum)
[place]  [address] [place], [address]   COLONOSCOPY PROCEDURE REPORT  PATIENT: Andrew, Joseph  MR#: [id number] BIRTHDATE: 1946-07-10 , 45  yrs. old GENDER: male ENDOSCOPIST: Ladene Artist, MD, [facility] PROCEDURE DATE:  12/20/2014 PROCEDURE:   Colonoscopy with biopsy and Colonoscopy with snare polypectomy First Screening Colonoscopy - Avg.  risk and is 50 yrs.  old or older - No.  Prior Negative Screening - Now for repeat screening. N/A  History of Adenoma - Now for follow-up colonoscopy & has been > or = to 3 yrs.  Yes hx of adenoma.  Has been 3 or more years since last colonoscopy.  Polyps Removed Today? Yes. ASA CLASS:   Class II INDICATIONS:surveillance colonoscopy based on a history of adenomatous colonic polyp(s). MEDICATIONS: Monitored anesthesia care and Propofol 200 mg IV DESCRIPTION OF PROCEDURE:   After the risks benefits and alternatives of the procedure were thoroughly explained, informed consent was obtained.  The digital rectal exam revealed small external hemorrhoids of the rectum.   The LB CF-H180AL Loaner [id number]  endoscope was introduced through the anus and advanced to the cecum, which was identified by both the appendix and ileocecal valve. No adverse events experienced.   The quality of the prep was good, using MoviPrep  The instrument was then slowly withdrawn as the colon was fully examined.  COLON FINDINGS: Three sessile polyps measuring 5 mm in size were found in the descending colon, rectum, and transverse colon. Polypectomies were performed with a cold snare.  The resection was complete, the polyp tissue was completely retrieved and sent to histology.   There was mild diverticulosis noted in the sigmoid colon.   Abnormal mucosa was found in the rectum.  Three areas mucosa was erythematous and had granularity.  Multiple biopsies of the area were performed.   The examination was otherwise normal. Retroflexed views revealed no  abnormalities. The time to cecum=2 minutes 05 seconds.  Withdrawal time=12 minutes 56 seconds.  The scope was withdrawn and the procedure completed. COMPLICATIONS: There were no immediate complications.  ENDOSCOPIC IMPRESSION: 1.   Three sessile polyps in the descending colon, rectum, and transverse colon; polypectomies performed with a cold snare 2.   Mild diverticulosis in the sigmoid colon 3.   Abnormal mucosa in the rectum; multiple biopsies performed 4.   Small external hemorrhoids  RECOMMENDATIONS: 1.  Await pathology results 2.  High fiber diet with liberal fluid intake. 3.  Await pathology results 4.  Repeat Colonoscopy in 5 years.  eSigned:  Ladene Artist, MD, [facility] 12/20/2014 11:10 AM

## 2014-12-21 ENCOUNTER — Telehealth: Payer: Self-pay | Admitting: *Deleted

## 2014-12-21 NOTE — Telephone Encounter (Signed)
  Follow up Call-  Call back number 12/20/2014  Post procedure Call Back phone  # [phone]  Permission to leave phone message Yes     Patient questions:  Do you have a fever, pain , or abdominal swelling? No. Pain Score  0 *  Have you tolerated food without any problems? Yes.    Have you been able to return to your normal activities? Yes.    Do you have any questions about your discharge instructions: Diet   No. Medications  No. Follow up visit  No.  Do you have questions or concerns about your Care? No.  Actions: * If pain score is 4 or above: No action needed, pain <4.  Pt. Said Thank you.

## 2014-12-27 ENCOUNTER — Encounter: Payer: Self-pay | Admitting: Gastroenterology

## 2015-01-04 ENCOUNTER — Other Ambulatory Visit: Payer: Self-pay | Admitting: Internal Medicine

## 2015-02-15 ENCOUNTER — Ambulatory Visit: Payer: Medicare Other | Admitting: Neurology

## 2015-02-28 ENCOUNTER — Other Ambulatory Visit: Payer: Self-pay | Admitting: Internal Medicine

## 2015-03-24 ENCOUNTER — Other Ambulatory Visit: Payer: Self-pay | Admitting: Internal Medicine

## 2015-03-30 ENCOUNTER — Other Ambulatory Visit: Payer: Self-pay | Admitting: Internal Medicine

## 2015-04-17 ENCOUNTER — Other Ambulatory Visit: Payer: Self-pay | Admitting: Internal Medicine

## 2015-04-18 ENCOUNTER — Other Ambulatory Visit: Payer: Self-pay | Admitting: Internal Medicine

## 2015-04-20 MED ORDER — ZOLPIDEM TARTRATE 10 MG PO TABS: 5.0000 mg | ORAL_TABLET | Freq: Every day | ORAL | Status: DC

## 2015-04-20 NOTE — Addendum Note (Signed)
Addended by: Lowella Dandy on: 04/20/2015 10:23 AM   Modules accepted: Orders

## 2015-06-30 ENCOUNTER — Other Ambulatory Visit: Payer: Self-pay | Admitting: Internal Medicine

## 2015-08-04 ENCOUNTER — Other Ambulatory Visit: Payer: Self-pay | Admitting: Internal Medicine

## 2015-08-08 ENCOUNTER — Encounter: Payer: Self-pay | Admitting: Internal Medicine

## 2015-08-14 ENCOUNTER — Other Ambulatory Visit: Payer: Self-pay | Admitting: Internal Medicine

## 2015-08-28 ENCOUNTER — Other Ambulatory Visit: Payer: Self-pay | Admitting: Internal Medicine

## 2015-08-30 ENCOUNTER — Telehealth: Payer: Self-pay | Admitting: Internal Medicine

## 2015-08-30 DIAGNOSIS — Z0000 Encounter for general adult medical examination without abnormal findings: Secondary | ICD-10-CM

## 2015-08-30 NOTE — Telephone Encounter (Signed)
Pt called in and wants to know if you can put labs in for him to go in in the morning because his CPE is Friday?

## 2015-08-31 ENCOUNTER — Other Ambulatory Visit (INDEPENDENT_AMBULATORY_CARE_PROVIDER_SITE_OTHER): Payer: Medicare Other

## 2015-08-31 DIAGNOSIS — Z0000 Encounter for general adult medical examination without abnormal findings: Secondary | ICD-10-CM

## 2015-08-31 DIAGNOSIS — E785 Hyperlipidemia, unspecified: Secondary | ICD-10-CM | POA: Diagnosis not present

## 2015-08-31 DIAGNOSIS — N400 Enlarged prostate without lower urinary tract symptoms: Secondary | ICD-10-CM | POA: Diagnosis not present

## 2015-08-31 DIAGNOSIS — I10 Essential (primary) hypertension: Secondary | ICD-10-CM | POA: Diagnosis not present

## 2015-08-31 LAB — LIPID PANEL
Cholesterol: 173 mg/dL (ref 0–200)
HDL: 42.8 mg/dL (ref >39.00)
LDL Cholesterol: 105 mg/dL — ABNORMAL HIGH (ref 0–99)
NONHDL: 129.82
Total CHOL/HDL Ratio: 4
Triglycerides: 123 mg/dL (ref 0.0–149.0)
VLDL: 24.6 mg/dL (ref 0.0–40.0)

## 2015-08-31 LAB — BASIC METABOLIC PANEL
BUN: 11 mg/dL (ref 6–23)
CALCIUM: 9.5 mg/dL (ref 8.4–10.5)
CHLORIDE: 104 meq/L (ref 96–112)
CO2: 32 meq/L (ref 19–32)
Creatinine, Ser: 1.14 mg/dL (ref 0.40–1.50)
GFR: 67.43 mL/min (ref >60.00)
Glucose, Bld: 94 mg/dL (ref 70–99)
Potassium: 3.8 mEq/L (ref 3.5–5.1)
SODIUM: 143 meq/L (ref 135–145)

## 2015-08-31 LAB — URINALYSIS, ROUTINE W REFLEX MICROSCOPIC
Bilirubin Urine: NEGATIVE
Hgb urine dipstick: NEGATIVE
KETONES UR: NEGATIVE
Leukocytes, UA: NEGATIVE
Nitrite: NEGATIVE
SPECIFIC GRAVITY, URINE: 1.025 (ref 1.000–1.030)
Total Protein, Urine: NEGATIVE
UROBILINOGEN UA: 0.2 (ref 0.0–1.0)
Urine Glucose: NEGATIVE
pH: 6 (ref 5.0–8.0)

## 2015-08-31 LAB — CBC WITH DIFFERENTIAL/PLATELET
Basophils Absolute: 0 10*3/uL (ref 0.0–0.1)
Basophils Relative: 0.4 % (ref 0.0–3.0)
EOS PCT: 2.9 % (ref 0.0–5.0)
Eosinophils Absolute: 0.2 10*3/uL (ref 0.0–0.7)
HCT: 44.8 % (ref 39.0–52.0)
Hemoglobin: 15.2 g/dL (ref 13.0–17.0)
LYMPHS ABS: 1.2 10*3/uL (ref 0.7–4.0)
Lymphocytes Relative: 20 % (ref 12.0–46.0)
MCHC: 34 g/dL (ref 30.0–36.0)
MCV: 90.5 fl (ref 78.0–100.0)
MONOS PCT: 8.1 % (ref 3.0–12.0)
Monocytes Absolute: 0.5 10*3/uL (ref 0.1–1.0)
NEUTROS ABS: 4.2 10*3/uL (ref 1.4–7.7)
NEUTROS PCT: 68.6 % (ref 43.0–77.0)
PLATELETS: 140 10*3/uL — AB (ref 150.0–400.0)
RBC: 4.95 Mil/uL (ref 4.22–5.81)
RDW: 14.7 % (ref 11.5–15.5)
WBC: 6.1 10*3/uL (ref 4.0–10.5)

## 2015-08-31 LAB — PSA: PSA: 0 ng/mL — ABNORMAL LOW (ref 0.10–4.00)

## 2015-08-31 LAB — HEPATIC FUNCTION PANEL
ALK PHOS: 67 U/L (ref 39–117)
ALT: 20 U/L (ref 0–53)
AST: 20 U/L (ref 0–37)
Albumin: 4.4 g/dL (ref 3.5–5.2)
BILIRUBIN DIRECT: 0.1 mg/dL (ref 0.0–0.3)
TOTAL PROTEIN: 6.7 g/dL (ref 6.0–8.3)
Total Bilirubin: 0.8 mg/dL (ref 0.2–1.2)

## 2015-08-31 LAB — TSH: TSH: 1.54 u[IU]/mL (ref 0.35–4.50)

## 2015-08-31 NOTE — Telephone Encounter (Signed)
Labs entered. Pt informed

## 2015-09-01 ENCOUNTER — Ambulatory Visit (INDEPENDENT_AMBULATORY_CARE_PROVIDER_SITE_OTHER): Payer: Medicare Other | Admitting: Internal Medicine

## 2015-09-01 ENCOUNTER — Encounter: Payer: Self-pay | Admitting: Internal Medicine

## 2015-09-01 VITALS — BP 128/76 | HR 75 | Ht 68.0 in | Wt 188.0 lb

## 2015-09-01 DIAGNOSIS — Z0000 Encounter for general adult medical examination without abnormal findings: Secondary | ICD-10-CM | POA: Diagnosis not present

## 2015-09-01 DIAGNOSIS — G4700 Insomnia, unspecified: Secondary | ICD-10-CM | POA: Diagnosis not present

## 2015-09-01 DIAGNOSIS — I10 Essential (primary) hypertension: Secondary | ICD-10-CM

## 2015-09-01 MED ORDER — TRIAMCINOLONE ACETONIDE 0.5 % EX CREA: 1.0000 "application " | TOPICAL_CREAM | Freq: Two times a day (BID) | CUTANEOUS | Status: DC

## 2015-09-01 MED ORDER — VITAMIN D3 50 MCG (2000 UT) PO CAPS: 2000.0000 [IU] | ORAL_CAPSULE | Freq: Every day | ORAL | Status: DC

## 2015-09-01 MED ORDER — LOSARTAN POTASSIUM-HCTZ 100-25 MG PO TABS: 1.0000 | ORAL_TABLET | Freq: Every evening | ORAL | Status: DC

## 2015-09-01 MED ORDER — AMLODIPINE BESYLATE 5 MG PO TABS: 5.0000 mg | ORAL_TABLET | Freq: Every day | ORAL | Status: DC

## 2015-09-01 MED ORDER — ZOLPIDEM TARTRATE 10 MG PO TABS: 5.0000 mg | ORAL_TABLET | Freq: Every day | ORAL | Status: DC

## 2015-09-01 MED ORDER — FLUTICASONE PROPIONATE 50 MCG/ACT NA SUSP: 2.0000 | Freq: Every day | NASAL | Status: DC

## 2015-09-01 MED ORDER — LABETALOL HCL 300 MG PO TABS: 150.0000 mg | ORAL_TABLET | Freq: Two times a day (BID) | ORAL | Status: DC

## 2015-09-01 MED ORDER — EZETIMIBE-SIMVASTATIN 10-40 MG PO TABS: 1.0000 | ORAL_TABLET | Freq: Every day | ORAL | Status: DC

## 2015-09-01 NOTE — Assessment & Plan Note (Signed)
Chronic  Zolpidem prn  Potential benefits of a long term benzodiazepines  use as well as potential risks  and complications were explained to the patient and were aknowledged. 

## 2015-09-01 NOTE — Assessment & Plan Note (Signed)
Amlodipine, Labetalol, Losartan HCT 

## 2015-09-01 NOTE — Progress Notes (Signed)
Pre visit review using our clinic review tool, if applicable. No additional management support is needed unless otherwise documented below in the visit note. 

## 2015-09-01 NOTE — Patient Instructions (Signed)

## 2015-09-01 NOTE — Progress Notes (Signed)
Subjective:  Patient ID: Andrew Joseph, male    DOB: July 28, 1946  Age: 69 y.o. MRN: [id number]  CC: No chief complaint on file.   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 aspirin 325 MG tablet Take 325 mg by mouth every morning.     . desloratadine (CLARINEX) 5 MG tablet Take 1 tablet (5 mg total) by mouth daily. 30 tablet 11  . Melatonin-Pyridoxine (MELATIN PO) Take 3-10 mg by mouth at bedtime as needed (sleep).    [name] amLODipine (NORVASC) 5 MG tablet TAKE 1 TABLET BY MOUTH ONCE DAILY 30 tablet 0  . fluticasone (FLONASE) 50 MCG/ACT nasal spray Place 2 sprays into both nostrils daily. 16 g 3  . labetalol (NORMODYNE) 300 MG tablet TAKE 1/2 TABLET BY MOUTH TWICE DAILY 90 tablet 0  . labetalol (NORMODYNE) 300 MG tablet Take 0.5 tablets (150 mg total) by mouth 2 (two) times daily. --patient needs office visit before any further refills 30 tablet 0  . losartan-hydrochlorothiazide (HYZAAR) 100-25 MG per tablet TAKE 1 TABLET BY MOUTH EVERY EVENING 90 tablet 0  . triamcinolone cream (KENALOG) 0.5 % Apply 1 application topically 3 (three) times daily. 45 g 1  . VYTORIN 10-40 MG per tablet TAKE 1 TABLET BY MOUTH EVERY NIGHT AT BEDTIME 90 tablet 0  . zolpidem (AMBIEN) 10 MG tablet Take 0.5 tablets (5 mg total) by mouth at bedtime. 90 tablet 0   No facility-administered medications prior to visit.    ROS Review of Systems  Objective:  BP 128/76 mmHg  Pulse 75  Ht 5\' 8"  (1.727 m)  Wt 188 lb (85.276 kg)  BMI 28.59 kg/m2  SpO2 94%  BP Readings from Last 3 Encounters:  09/01/15 128/76  12/20/14 143/73  12/14/14 130/60    Wt Readings from Last 3 Encounters:  09/01/15 188 lb (85.276 kg)  12/20/14 186 lb (84.369 kg)  12/14/14 186 lb 9.6 oz (84.641 kg)    Physical Exam AKs  Lab Results  Component Value Date   WBC 6.1 08/31/2015   HGB 15.2 08/31/2015   HCT 44.8 08/31/2015   PLT 140.0* 08/31/2015   GLUCOSE 94 08/31/2015   CHOL 173 08/31/2015   TRIG 123.0 08/31/2015   HDL 42.80 08/31/2015   LDLDIRECT 296.1 08/12/2013   LDLCALC 105* 08/31/2015   ALT 20 08/31/2015   AST 20 08/31/2015   NA 143 08/31/2015   K 3.8 08/31/2015   CL 104 08/31/2015   CREATININE 1.14 08/31/2015   BUN 11 08/31/2015   CO2 32 08/31/2015   TSH 1.54 08/31/2015   PSA 0.00* 08/31/2015   INR 0.99 07/31/2011   HGBA1C 5.8 03/09/2014    Dg Chest 2 View  10/13/2014   CLINICAL DATA:  Persistent cough for 3 or 4 weeks. History of hypertension and coronary artery disease. Initial encounter.  EXAM: CHEST  2 VIEW  COMPARISON:  Radiographs 08/14/2009 and 12/25/2007.  FINDINGS: The heart size and mediastinal contours are normal. The lungs are clear. There is no pleural effusion or pneumothorax. No acute osseous findings are identified. There are stable old right-sided rib fractures and mild degenerative changes throughout the spine.  IMPRESSION: Stable chest.  No active cardiopulmonary process   Electronically Signed   By: Camie Patience M.D.   On: 10/13/2014 15:18    Assessment & Plan:   Diagnoses and all orders for this visit:  Well adult exam  Essential hypertension  INSOMNIA, PERSISTENT  Other orders -     losartan-hydrochlorothiazide (HYZAAR) 100-25 MG tablet; Take 1 tablet by mouth every evening. -     amLODipine (NORVASC) 5 MG tablet; Take 1 tablet (5 mg total) by mouth daily. -     labetalol (NORMODYNE) 300 MG tablet; Take 0.5 tablets (150 mg total) by mouth 2 (two) times daily. -     ezetimibe-simvastatin (VYTORIN) 10-40 MG tablet; Take 1 tablet by mouth at bedtime. -     zolpidem (AMBIEN) 10 MG tablet; Take 0.5 tablets (5 mg total) by mouth at bedtime. -     triamcinolone cream (KENALOG) 0.5 %; Apply 1 application topically 2 (two) times daily. Use prn -     fluticasone (FLONASE) 50 MCG/ACT nasal spray; Place 2 sprays into both nostrils daily. -     Cholecalciferol (VITAMIN D3) 2000 UNITS capsule; Take 1  capsule (2,000 Units total) by mouth daily.   I have discontinued Mr. Burruss's labetalol. I have changed his VYTORIN to ezetimibe-simvastatin. I have also changed his losartan-hydrochlorothiazide, amLODipine, labetalol, and triamcinolone cream. Additionally, I am having him start on Vitamin D3. Lastly, I am having him maintain his aspirin, Melatonin-Pyridoxine (MELATIN PO), desloratadine, zolpidem, and fluticasone.  Meds ordered this encounter  Medications  . losartan-hydrochlorothiazide (HYZAAR) 100-25 MG tablet    Sig: Take 1 tablet by mouth every evening.    Dispense:  90 tablet    Refill:  3  . amLODipine (NORVASC) 5 MG tablet    Sig: Take 1 tablet (5 mg total) by mouth daily.    Dispense:  90 tablet    Refill:  3  . labetalol (NORMODYNE) 300 MG tablet    Sig: Take 0.5 tablets (150 mg total) by mouth 2 (two) times daily.    Dispense:  90 tablet    Refill:  3  . ezetimibe-simvastatin (VYTORIN) 10-40 MG tablet    Sig: Take 1 tablet by mouth at bedtime.    Dispense:  90 tablet    Refill:  3  . zolpidem (AMBIEN) 10 MG tablet    Sig: Take 0.5 tablets (5 mg total) by mouth at bedtime.    Dispense:  90 tablet    Refill:  1  . triamcinolone cream (KENALOG) 0.5 %    Sig: Apply 1 application topically 2 (two) times daily. Use prn    Dispense:  45 g    Refill:  3  . fluticasone (FLONASE) 50 MCG/ACT nasal spray    Sig: Place 2 sprays into both nostrils daily.    Dispense:  16 g    Refill:  3  . Cholecalciferol (VITAMIN D3) 2000 UNITS capsule    Sig: Take 1 capsule (2,000 Units total) by mouth daily.    Dispense:  100 capsule    Refill:  3     Follow-up: Return in about 6 months (around 02/29/2016) for a follow-up visit.  [name], MD

## 2015-09-09 ENCOUNTER — Encounter: Payer: Self-pay | Admitting: Internal Medicine

## 2015-11-08 ENCOUNTER — Encounter: Payer: Self-pay | Admitting: Cardiology

## 2015-11-08 ENCOUNTER — Encounter: Payer: Self-pay | Admitting: Cardiovascular Disease

## 2015-11-08 ENCOUNTER — Encounter: Payer: Self-pay | Admitting: Internal Medicine

## 2016-03-26 ENCOUNTER — Other Ambulatory Visit: Payer: Self-pay | Admitting: Internal Medicine

## 2016-03-28 NOTE — Telephone Encounter (Signed)
Called pharmacy spokw w/Charlye gave md approval.../lmb

## 2016-03-28 NOTE — Telephone Encounter (Signed)
OV q 6 mo Thx

## 2016-07-19 ENCOUNTER — Telehealth: Payer: Self-pay

## 2016-07-19 NOTE — Telephone Encounter (Signed)
Patient is on the list for [employer] 2017 and may be a good candidate for an AWV in 2017. Please let me know if/when appt is scheduled.   

## 2016-08-20 ENCOUNTER — Telehealth: Payer: Self-pay | Admitting: *Deleted

## 2016-08-20 NOTE — Telephone Encounter (Signed)
OK to fill this prescription with additional refills x0 Needs OV Thank you!  

## 2016-08-20 NOTE — Telephone Encounter (Signed)
Rec'd fax pt requesting refill on Zolpidem 10 mg../l;mb 

## 2016-08-21 MED ORDER — ZOLPIDEM TARTRATE 10 MG PO TABS: ORAL_TABLET | ORAL | 0 refills | Status: DC

## 2016-08-21 NOTE — Telephone Encounter (Signed)
Updated med list called refill into pharmacy spoke w/charlie gave md authorization...Johny Chess

## 2016-09-16 ENCOUNTER — Other Ambulatory Visit: Payer: Self-pay | Admitting: Internal Medicine

## 2016-12-16 ENCOUNTER — Other Ambulatory Visit: Payer: Self-pay | Admitting: Internal Medicine

## 2016-12-17 ENCOUNTER — Other Ambulatory Visit: Payer: Self-pay | Admitting: *Deleted

## 2016-12-17 MED ORDER — LABETALOL HCL 300 MG PO TABS: 150.0000 mg | ORAL_TABLET | Freq: Two times a day (BID) | ORAL | 0 refills | Status: DC

## 2016-12-20 ENCOUNTER — Telehealth: Payer: Self-pay

## 2016-12-20 MED ORDER — LOSARTAN POTASSIUM-HCTZ 100-25 MG PO TABS: 1.0000 | ORAL_TABLET | Freq: Every evening | ORAL | 0 refills | Status: DC

## 2016-12-20 MED ORDER — AMLODIPINE BESYLATE 5 MG PO TABS: 5.0000 mg | ORAL_TABLET | Freq: Every day | ORAL | 0 refills | Status: DC

## 2016-12-20 NOTE — Telephone Encounter (Signed)
Needs OV.  

## 2016-12-20 NOTE — Telephone Encounter (Signed)
Patient contacted and made aware of needing office visit for medication refills, sent in 30 days.

## 2016-12-20 NOTE — Telephone Encounter (Signed)
ambien faxed to [facility]

## 2017-01-13 ENCOUNTER — Other Ambulatory Visit: Payer: Self-pay | Admitting: Internal Medicine

## 2017-01-14 ENCOUNTER — Other Ambulatory Visit: Payer: Self-pay | Admitting: Internal Medicine

## 2017-01-15 ENCOUNTER — Telehealth: Payer: Self-pay | Admitting: Internal Medicine

## 2017-01-15 NOTE — Telephone Encounter (Signed)
Pls w/in w/someone else - I'm leaving on Thur after am schedule Thx

## 2017-01-15 NOTE — Telephone Encounter (Signed)
Pt called in said Plot told him that he would need an appt.  He wants to know if he can be worked in this week.

## 2017-01-15 NOTE — Telephone Encounter (Signed)
Routing to dr plotnikov---do you want to work this patient in---please advise if yes and when--I will call patient back, thanks

## 2017-01-16 NOTE — Telephone Encounter (Signed)
Left message asking patient to call back to let us know what he needs appt for, dr plot is off this afternoon, not here tomorrow on Friday---need to know how urgently he needs appt and what he needs to be seen for---could possibly work in with [facility] tomorrow unless patient can wait until next week to see dr Alain Marion

## 2017-01-16 NOTE — Telephone Encounter (Signed)
Got patient scheduled

## 2017-01-20 ENCOUNTER — Other Ambulatory Visit (INDEPENDENT_AMBULATORY_CARE_PROVIDER_SITE_OTHER): Payer: Medicare Other

## 2017-01-20 ENCOUNTER — Ambulatory Visit (INDEPENDENT_AMBULATORY_CARE_PROVIDER_SITE_OTHER): Payer: Medicare Other | Admitting: Internal Medicine

## 2017-01-20 ENCOUNTER — Encounter: Payer: Self-pay | Admitting: Internal Medicine

## 2017-01-20 VITALS — BP 128/62 | HR 62 | Temp 97.6°F | Resp 16 | Ht 68.0 in | Wt 188.5 lb

## 2017-01-20 DIAGNOSIS — G4700 Insomnia, unspecified: Secondary | ICD-10-CM

## 2017-01-20 DIAGNOSIS — Z8546 Personal history of malignant neoplasm of prostate: Secondary | ICD-10-CM

## 2017-01-20 DIAGNOSIS — I10 Essential (primary) hypertension: Secondary | ICD-10-CM

## 2017-01-20 DIAGNOSIS — E785 Hyperlipidemia, unspecified: Secondary | ICD-10-CM

## 2017-01-20 DIAGNOSIS — Z0000 Encounter for general adult medical examination without abnormal findings: Secondary | ICD-10-CM

## 2017-01-20 LAB — HEPATIC FUNCTION PANEL
ALT: 24 U/L (ref 0–53)
AST: 22 U/L (ref 0–37)
Albumin: 4.3 g/dL (ref 3.5–5.2)
Alkaline Phosphatase: 64 U/L (ref 39–117)
BILIRUBIN DIRECT: 0.1 mg/dL (ref 0.0–0.3)
BILIRUBIN TOTAL: 0.5 mg/dL (ref 0.2–1.2)
Total Protein: 6.7 g/dL (ref 6.0–8.3)

## 2017-01-20 LAB — CBC WITH DIFFERENTIAL/PLATELET
Basophils Absolute: 0 10*3/uL (ref 0.0–0.1)
Basophils Relative: 0.6 % (ref 0.0–3.0)
EOS ABS: 0.2 10*3/uL (ref 0.0–0.7)
Eosinophils Relative: 4.7 % (ref 0.0–5.0)
HCT: 43.1 % (ref 39.0–52.0)
HEMOGLOBIN: 14.9 g/dL (ref 13.0–17.0)
LYMPHS ABS: 1.3 10*3/uL (ref 0.7–4.0)
Lymphocytes Relative: 27.6 % (ref 12.0–46.0)
MCHC: 34.5 g/dL (ref 30.0–36.0)
MCV: 88.1 fl (ref 78.0–100.0)
MONO ABS: 0.5 10*3/uL (ref 0.1–1.0)
Monocytes Relative: 10 % (ref 3.0–12.0)
NEUTROS PCT: 57.1 % (ref 43.0–77.0)
Neutro Abs: 2.6 10*3/uL (ref 1.4–7.7)
Platelets: 156 10*3/uL (ref 150.0–400.0)
RBC: 4.89 Mil/uL (ref 4.22–5.81)
RDW: 14.9 % (ref 11.5–15.5)
WBC: 4.6 10*3/uL (ref 4.0–10.5)

## 2017-01-20 LAB — LIPID PANEL
CHOL/HDL RATIO: 5
Cholesterol: 176 mg/dL (ref 0–200)
HDL: 33.7 mg/dL — ABNORMAL LOW (ref >39.00)
LDL CALC: 125 mg/dL — AB (ref 0–99)
NonHDL: 141.96
TRIGLYCERIDES: 84 mg/dL (ref 0.0–149.0)
VLDL: 16.8 mg/dL (ref 0.0–40.0)

## 2017-01-20 LAB — BASIC METABOLIC PANEL
BUN: 14 mg/dL (ref 6–23)
CO2: 27 mEq/L (ref 19–32)
CREATININE: 1.02 mg/dL (ref 0.40–1.50)
Calcium: 9.2 mg/dL (ref 8.4–10.5)
Chloride: 104 mEq/L (ref 96–112)
GFR: 76.36 mL/min (ref >60.00)
Glucose, Bld: 102 mg/dL — ABNORMAL HIGH (ref 70–99)
Potassium: 3.7 mEq/L (ref 3.5–5.1)
Sodium: 139 mEq/L (ref 135–145)

## 2017-01-20 LAB — URINALYSIS
BILIRUBIN URINE: NEGATIVE
Hgb urine dipstick: NEGATIVE
Ketones, ur: NEGATIVE
LEUKOCYTES UA: NEGATIVE
NITRITE: NEGATIVE
PH: 6 (ref 5.0–8.0)
SPECIFIC GRAVITY, URINE: 1.02 (ref 1.000–1.030)
TOTAL PROTEIN, URINE-UPE24: NEGATIVE
Urine Glucose: NEGATIVE
Urobilinogen, UA: 0.2 (ref 0.0–1.0)

## 2017-01-20 LAB — PSA: PSA: 0 ng/mL — ABNORMAL LOW (ref 0.10–4.00)

## 2017-01-20 LAB — TSH: TSH: 2.2 u[IU]/mL (ref 0.35–4.50)

## 2017-01-20 MED ORDER — LOSARTAN POTASSIUM-HCTZ 100-25 MG PO TABS: 1.0000 | ORAL_TABLET | Freq: Every evening | ORAL | 3 refills | Status: DC

## 2017-01-20 MED ORDER — AMLODIPINE BESYLATE 5 MG PO TABS: 5.0000 mg | ORAL_TABLET | Freq: Every day | ORAL | 3 refills | Status: DC

## 2017-01-20 MED ORDER — EZETIMIBE-SIMVASTATIN 10-40 MG PO TABS: 1.0000 | ORAL_TABLET | Freq: Every day | ORAL | 3 refills | Status: DC

## 2017-01-20 MED ORDER — ZOLPIDEM TARTRATE 5 MG PO TABS: 5.0000 mg | ORAL_TABLET | Freq: Every evening | ORAL | 1 refills | Status: DC | PRN

## 2017-01-20 MED ORDER — LABETALOL HCL 300 MG PO TABS: 150.0000 mg | ORAL_TABLET | Freq: Two times a day (BID) | ORAL | 3 refills | Status: DC

## 2017-01-20 NOTE — Progress Notes (Signed)
Pre-visit discussion using our clinic review tool. No additional management support is needed unless otherwise documented below in the visit note.  

## 2017-01-20 NOTE — Patient Instructions (Signed)

## 2017-01-20 NOTE — Assessment & Plan Note (Signed)
On Vytorin Labs

## 2017-01-20 NOTE — Assessment & Plan Note (Signed)
Amlodipine, Labetalol, Losartan HCT 

## 2017-01-20 NOTE — Assessment & Plan Note (Addendum)
Here for medicare wellness/physical  Diet: heart healthy  Physical activity: not sedentary  Depression/mood screen: doing OK  Hearing: intact to whispered voice  Visual acuity: grossly normal, performs annual eye exam  ADLs: capable  Fall risk: low to none  Home safety: good  Cognitive evaluation: intact to orientation, naming, recall and repetition  EOL planning: adv directives, full code/ I agree  I have personally reviewed and have noted  1. The patient's medical, surgical and social history  2. Their use of alcohol, tobacco or illicit drugs  3. Their current medications and supplements  4. The patient's functional ability including ADL's, fall risks, home safety risks and hearing or visual impairment.  5. Diet and physical activities  6. Evidence for depression or mood disorders 7. The roster of all physicians providing medical care to patient - is listed in the Snapshot section of the chart and reviewed today.    Today patient counseled on age appropriate routine health concerns for screening and prevention, each reviewed and up to date or declined. Immunizations reviewed and up to date or declined. Labs ordered and reviewed. Risk factors for depression reviewed and negative. Hearing function and visual acuity are intact. ADLs screened and addressed as needed. Functional ability and level of safety reviewed and appropriate. Education, counseling and referrals performed based on assessed risks today. Patient provided with a copy of personalized plan for preventive services.   Colon due 2021 Dr Fuller Plan  Pt declined all shots

## 2017-01-20 NOTE — Progress Notes (Signed)
Subjective:  Patient ID: Andrew Joseph., male    DOB: 1946-04-19  Age: 71 y.o. MRN: [id number]  CC: Annual Exam   HPI Kashyap Nuttle. presents for a well exam  Outpatient Medications Prior to Visit  Medication Sig Dispense Refill  . amLODipine (NORVASC) 5 MG tablet Take 1 tablet (5 mg total) by mouth daily. 30 tablet 0  . aspirin 325 MG tablet Take 325 mg by mouth every morning.     . Cholecalciferol (VITAMIN D3) 2000 UNITS capsule Take 1 capsule (2,000 Units total) by mouth daily. (Patient taking differently: Take 2,000 Units by mouth as needed. ) 100 capsule 3  . desloratadine (CLARINEX) 5 MG tablet Take 1 tablet (5 mg total) by mouth daily. (Patient taking differently: Take 5 mg by mouth as needed. ) 30 tablet 11  . ezetimibe-simvastatin (VYTORIN) 10-40 MG tablet TAKE 1 TABLET BY MOUTH EVERY NIGHT AT BEDTIME 30 tablet 0  . fluticasone (FLONASE) 50 MCG/ACT nasal spray Place 2 sprays into both nostrils daily. (Patient taking differently: Place 2 sprays into both nostrils as needed. ) 16 g 3  . labetalol (NORMODYNE) 300 MG tablet Take 0.5 tablets (150 mg total) by mouth 2 (two) times daily. 60 tablet 0  . losartan-hydrochlorothiazide (HYZAAR) 100-25 MG tablet Take 1 tablet by mouth every evening. 30 tablet 0  . zolpidem (AMBIEN) 10 MG tablet TAKE 1/2 TABLET BY MOUTH AT BEDTIME 30 tablet 0  . Melatonin-Pyridoxine (MELATIN PO) Take 3-10 mg by mouth at bedtime as needed (sleep).    . triamcinolone cream (KENALOG) 0.5 % APPLY TO THE AFFECTED AREA TWICE DAILY AS NEEDED 45 g 0   No facility-administered medications prior to visit.     ROS Review of Systems  Constitutional: Negative for appetite change, fatigue and unexpected weight change.  HENT: Negative for congestion, nosebleeds, sneezing, sore throat and trouble swallowing.   Eyes: Negative for itching and visual disturbance.  Respiratory: Negative for cough.   Cardiovascular: Negative for chest pain, palpitations and leg  swelling.  Gastrointestinal: Negative for abdominal distention, blood in stool, diarrhea and nausea.  Genitourinary: Negative for frequency and hematuria.  Musculoskeletal: Negative for back pain, gait problem, joint swelling and neck pain.  Skin: Negative for rash.  Neurological: Negative for dizziness, tremors, speech difficulty and weakness.  Psychiatric/Behavioral: Positive for sleep disturbance. Negative for agitation and dysphoric mood. The patient is not nervous/anxious.     Objective:  BP 128/62   Pulse 62   Temp 97.6 F (36.4 C) (Oral)   Resp 16   Ht 5\' 8"  (1.727 m)   Wt 188 lb 8 oz (85.5 kg)   SpO2 98%   BMI 28.66 kg/m   BP Readings from Last 3 Encounters:  01/20/17 128/62  09/01/15 128/76  12/20/14 (!) 143/73    Wt Readings from Last 3 Encounters:  01/20/17 188 lb 8 oz (85.5 kg)  09/01/15 188 lb (85.3 kg)  12/20/14 186 lb (84.4 kg)    Physical Exam  Constitutional: He is oriented to person, place, and time. He appears well-developed. No distress.  NAD  HENT:  Mouth/Throat: Oropharynx is clear and moist.  Eyes: Conjunctivae are normal. Pupils are equal, round, and reactive to light.  Neck: Normal range of motion. No JVD present. No thyromegaly present.  Cardiovascular: Normal rate, regular rhythm, normal heart sounds and intact distal pulses.  Exam reveals no gallop and no friction rub.   No murmur heard. Pulmonary/Chest: Effort normal and breath sounds normal.  No respiratory distress. He has no wheezes. He has no rales. He exhibits no tenderness.  Abdominal: Soft. Bowel sounds are normal. He exhibits no distension and no mass. There is no tenderness. There is no rebound and no guarding.  Musculoskeletal: Normal range of motion. He exhibits no edema or tenderness.  Lymphadenopathy:    He has no cervical adenopathy.  Neurological: He is alert and oriented to person, place, and time. He has normal reflexes. No cranial nerve deficit. He exhibits normal muscle  tone. He displays a negative Romberg sign. Coordination and gait normal.  Skin: Skin is warm and dry. No rash noted.  Psychiatric: He has a normal mood and affect. His behavior is normal. Judgment and thought content normal.    Lab Results  Component Value Date   WBC 6.1 08/31/2015   HGB 15.2 08/31/2015   HCT 44.8 08/31/2015   PLT 140.0 (L) 08/31/2015   GLUCOSE 94 08/31/2015   CHOL 173 08/31/2015   TRIG 123.0 08/31/2015   HDL 42.80 08/31/2015   LDLDIRECT 296.1 08/12/2013   LDLCALC 105 (H) 08/31/2015   ALT 20 08/31/2015   AST 20 08/31/2015   NA 143 08/31/2015   K 3.8 08/31/2015   CL 104 08/31/2015   CREATININE 1.14 08/31/2015   BUN 11 08/31/2015   CO2 32 08/31/2015   TSH 1.54 08/31/2015   PSA 0.00 (L) 08/31/2015   INR 0.99 07/31/2011   HGBA1C 5.8 03/09/2014    Dg Chest 2 View  Result Date: 10/13/2014 CLINICAL DATA:  Persistent cough for 3 or 4 weeks. History of hypertension and coronary artery disease. Initial encounter. EXAM: CHEST  2 VIEW COMPARISON:  Radiographs 08/14/2009 and 12/25/2007. FINDINGS: The heart size and mediastinal contours are normal. The lungs are clear. There is no pleural effusion or pneumothorax. No acute osseous findings are identified. There are stable old right-sided rib fractures and mild degenerative changes throughout the spine. IMPRESSION: Stable chest.  No active cardiopulmonary process Electronically Signed   By: Camie Patience M.D.   On: 10/13/2014 15:18    Assessment & Plan:   There are no diagnoses linked to this encounter. I have discontinued Mr. Devonshire's Melatonin-Pyridoxine (MELATIN PO) and triamcinolone cream. I am also having him maintain his aspirin, desloratadine, fluticasone, Vitamin D3, ezetimibe-simvastatin, zolpidem, labetalol, amLODipine, and losartan-hydrochlorothiazide.  No orders of the defined types were placed in this encounter.    Follow-up: No Follow-up on file.  [name], MD

## 2017-01-20 NOTE — Assessment & Plan Note (Signed)
Zolpidem prn 

## 2017-01-20 NOTE — Assessment & Plan Note (Signed)
PSA

## 2017-09-26 ENCOUNTER — Other Ambulatory Visit: Payer: Self-pay | Admitting: Internal Medicine

## 2017-09-26 NOTE — Telephone Encounter (Signed)
Pharmacy called stating that patient is leaving to go out of town tomorrow and is requesting this medication to be filled as soon as possible.

## 2017-09-26 NOTE — Telephone Encounter (Signed)
Routing to dr plotnikov, please advise, thanks 

## 2017-10-01 NOTE — Telephone Encounter (Signed)
Rec'd call from Abigail Butts calling checking status on pt zolpidem refill that was requested on 10/26. MD has ok'd rx and  printed out. Kathalene Frames MD approval for zolpidem...Johny Chess

## 2017-11-17 ENCOUNTER — Other Ambulatory Visit: Payer: Self-pay | Admitting: Internal Medicine

## 2017-12-12 ENCOUNTER — Other Ambulatory Visit: Payer: Self-pay | Admitting: Internal Medicine

## 2017-12-12 ENCOUNTER — Telehealth: Payer: Self-pay | Admitting: Internal Medicine

## 2017-12-12 NOTE — Telephone Encounter (Signed)
Copied from [place] [id number]. Topic: General - Other >> Dec 12, 2017  2:53 PM Darl Householder, RMA wrote: Reason for CRM: patient is requesting a refill for Ambien 5 mg to be sent to [facility]

## 2017-12-15 NOTE — Telephone Encounter (Signed)
OV q 6 mo

## 2017-12-15 NOTE — Telephone Encounter (Signed)
Done He needs ROV q 6 mo - pls sch Thx

## 2017-12-16 NOTE — Telephone Encounter (Signed)
Notified pt refill has been sent pof. Pt has appt set up for this Feb../lmb

## 2017-12-25 ENCOUNTER — Telehealth: Payer: Self-pay

## 2017-12-25 NOTE — Telephone Encounter (Signed)
(  Routing to Gerty) Submitted request for authorization online today. Approval  Is currently pending for Ambien.

## 2018-01-08 ENCOUNTER — Telehealth: Payer: Self-pay | Admitting: Internal Medicine

## 2018-01-08 DIAGNOSIS — Z0000 Encounter for general adult medical examination without abnormal findings: Secondary | ICD-10-CM

## 2018-01-08 NOTE — Telephone Encounter (Signed)
Pt.notified

## 2018-01-08 NOTE — Telephone Encounter (Signed)
Copied from [place] [id number]. Topic: Quick Communication - See Telephone Encounter >> Jan 08, 2018 11:36 AM Bennye Alm wrote: CRM for notification. See Telephone encounter for:  Patient called and stated that he needs orders for labs before his appt with [name] on 01/13/18. He can be reached at [phone]. He would like to come in on Monday am between 8a-12a fasting. Per patient he needs orders for "all the usual ones." Please advise.  01/08/18.

## 2018-01-12 ENCOUNTER — Other Ambulatory Visit (INDEPENDENT_AMBULATORY_CARE_PROVIDER_SITE_OTHER): Payer: Medicare Other

## 2018-01-12 DIAGNOSIS — Z0000 Encounter for general adult medical examination without abnormal findings: Secondary | ICD-10-CM

## 2018-01-12 LAB — CBC WITH DIFFERENTIAL/PLATELET
BASOS PCT: 0.4 %
Basophils Absolute: 20 cells/uL (ref 0–200)
EOS PCT: 8.2 %
Eosinophils Absolute: 402 cells/uL (ref 15–500)
HEMATOCRIT: 42.9 % (ref 38.5–50.0)
HEMOGLOBIN: 14.9 g/dL (ref 13.2–17.1)
LYMPHS ABS: 1191 {cells}/uL (ref 850–3900)
MCH: 30.4 pg (ref 27.0–33.0)
MCHC: 34.7 g/dL (ref 32.0–36.0)
MCV: 87.6 fL (ref 80.0–100.0)
MPV: 11.3 fL (ref 7.5–12.5)
Monocytes Relative: 10.6 %
Neutro Abs: 2769 cells/uL (ref 1500–7800)
Neutrophils Relative %: 56.5 %
Platelets: 157 10*3/uL (ref 140–400)
RBC: 4.9 10*6/uL (ref 4.20–5.80)
RDW: 13.7 % (ref 11.0–15.0)
Total Lymphocyte: 24.3 %
WBC mixed population: 519 cells/uL (ref 200–950)
WBC: 4.9 10*3/uL (ref 3.8–10.8)

## 2018-01-12 LAB — BASIC METABOLIC PANEL
BUN: 15 mg/dL (ref 6–23)
CHLORIDE: 104 meq/L (ref 96–112)
CO2: 30 meq/L (ref 19–32)
Calcium: 9.2 mg/dL (ref 8.4–10.5)
Creatinine, Ser: 1.03 mg/dL (ref 0.40–1.50)
GFR: 75.3 mL/min (ref >60.00)
GLUCOSE: 111 mg/dL — AB (ref 70–99)
POTASSIUM: 3.8 meq/L (ref 3.5–5.1)
Sodium: 142 mEq/L (ref 135–145)

## 2018-01-12 LAB — LIPID PANEL
CHOLESTEROL: 160 mg/dL (ref 0–200)
HDL: 38.8 mg/dL — ABNORMAL LOW (ref >39.00)
LDL CALC: 109 mg/dL — AB (ref 0–99)
NonHDL: 121.19
Total CHOL/HDL Ratio: 4
Triglycerides: 61 mg/dL (ref 0.0–149.0)
VLDL: 12.2 mg/dL (ref 0.0–40.0)

## 2018-01-12 LAB — URINALYSIS, ROUTINE W REFLEX MICROSCOPIC
BILIRUBIN URINE: NEGATIVE
HGB URINE DIPSTICK: NEGATIVE
Ketones, ur: NEGATIVE
LEUKOCYTES UA: NEGATIVE
NITRITE: NEGATIVE
PH: 6 (ref 5.0–8.0)
RBC / HPF: NONE SEEN (ref 0–?)
Specific Gravity, Urine: 1.025 (ref 1.000–1.030)
Urine Glucose: NEGATIVE
Urobilinogen, UA: 0.2 (ref 0.0–1.0)

## 2018-01-12 LAB — HEPATIC FUNCTION PANEL
ALT: 25 U/L (ref 0–53)
AST: 25 U/L (ref 0–37)
Albumin: 4.1 g/dL (ref 3.5–5.2)
Alkaline Phosphatase: 55 U/L (ref 39–117)
BILIRUBIN DIRECT: 0.1 mg/dL (ref 0.0–0.3)
BILIRUBIN TOTAL: 0.6 mg/dL (ref 0.2–1.2)
Total Protein: 6.4 g/dL (ref 6.0–8.3)

## 2018-01-12 LAB — PSA: PSA: 0 ng/mL — AB (ref 0.10–4.00)

## 2018-01-12 LAB — TSH: TSH: 2.49 u[IU]/mL (ref 0.35–4.50)

## 2018-01-12 NOTE — Addendum Note (Signed)
Addended by: Kathlyn [place] on: 01/12/2018 10:56 AM   Modules accepted: Orders

## 2018-01-13 ENCOUNTER — Ambulatory Visit: Payer: Medicare Other | Admitting: Internal Medicine

## 2018-01-13 ENCOUNTER — Encounter: Payer: Self-pay | Admitting: Internal Medicine

## 2018-01-13 VITALS — BP 136/78 | HR 62 | Temp 97.9°F | Ht 68.0 in | Wt 193.1 lb

## 2018-01-13 DIAGNOSIS — I2510 Atherosclerotic heart disease of native coronary artery without angina pectoris: Secondary | ICD-10-CM

## 2018-01-13 DIAGNOSIS — I2583 Coronary atherosclerosis due to lipid rich plaque: Secondary | ICD-10-CM | POA: Diagnosis not present

## 2018-01-13 DIAGNOSIS — M25512 Pain in left shoulder: Secondary | ICD-10-CM | POA: Diagnosis not present

## 2018-01-13 DIAGNOSIS — E785 Hyperlipidemia, unspecified: Secondary | ICD-10-CM

## 2018-01-13 DIAGNOSIS — Z0000 Encounter for general adult medical examination without abnormal findings: Secondary | ICD-10-CM

## 2018-01-13 DIAGNOSIS — Z0001 Encounter for general adult medical examination with abnormal findings: Secondary | ICD-10-CM | POA: Diagnosis not present

## 2018-01-13 MED ORDER — EZETIMIBE-SIMVASTATIN 10-40 MG PO TABS: 1.0000 | ORAL_TABLET | Freq: Every day | ORAL | 3 refills | Status: DC

## 2018-01-13 MED ORDER — LABETALOL HCL 300 MG PO TABS: 150.0000 mg | ORAL_TABLET | Freq: Two times a day (BID) | ORAL | 3 refills | Status: DC

## 2018-01-13 MED ORDER — ZOLPIDEM TARTRATE 5 MG PO TABS: 5.0000 mg | ORAL_TABLET | Freq: Every evening | ORAL | 1 refills | Status: DC | PRN

## 2018-01-13 MED ORDER — AMLODIPINE BESYLATE 5 MG PO TABS: 5.0000 mg | ORAL_TABLET | Freq: Every day | ORAL | 3 refills | Status: DC

## 2018-01-13 MED ORDER — LOSARTAN POTASSIUM-HCTZ 100-25 MG PO TABS: 1.0000 | ORAL_TABLET | Freq: Every evening | ORAL | 3 refills | Status: DC

## 2018-01-13 NOTE — Assessment & Plan Note (Signed)
Vytorin 

## 2018-01-13 NOTE — Assessment & Plan Note (Signed)
No angina 

## 2018-01-13 NOTE — Assessment & Plan Note (Signed)
MSK - L trap is painful We can do trigger point injeection

## 2018-01-13 NOTE — Patient Instructions (Signed)
Rice sock heating pad We can do a trigger point injection

## 2018-01-13 NOTE — Progress Notes (Signed)
Subjective:  Patient ID: Andrew Essex., male    DOB: February 11, 1946  Age: 72 y.o. MRN: [id number]  CC: No chief complaint on file.   HPI Andrew Joseph. presents for a well exam F/u CAD, HTN, dyslipidemia f/u C/o pain in the L trap after swinging a heavy bag up on the desk  Outpatient Medications Prior to Visit  Medication Sig Dispense Refill  . aspirin 325 MG tablet Take 325 mg by mouth every morning.     . ezetimibe-simvastatin (VYTORIN) 10-40 MG tablet Take 1 tablet by mouth at bedtime. Patient needs office visit before refills will be given 90 tablet 0  . labetalol (NORMODYNE) 300 MG tablet Take 0.5 tablets (150 mg total) by mouth 2 (two) times daily. 180 tablet 3  . losartan-hydrochlorothiazide (HYZAAR) 100-25 MG tablet Take 1 tablet by mouth every evening. 90 tablet 3  . zolpidem (AMBIEN) 5 MG tablet TAKE 1 TABLET BY MOUTH EACH NIGHT AT BEDTIME AS NEEDED 30 tablet 0  . amLODipine (NORVASC) 5 MG tablet Take 1 tablet (5 mg total) by mouth daily. 90 tablet 3  . Cholecalciferol (VITAMIN D3) 2000 UNITS capsule Take 1 capsule (2,000 Units total) by mouth daily. (Patient not taking: Reported on 01/13/2018) 100 capsule 3  . desloratadine (CLARINEX) 5 MG tablet Take 1 tablet (5 mg total) by mouth daily. (Patient not taking: Reported on 01/13/2018) 30 tablet 11  . fluticasone (FLONASE) 50 MCG/ACT nasal spray Place 2 sprays into both nostrils daily. (Patient not taking: Reported on 01/13/2018) 16 g 3   No facility-administered medications prior to visit.     ROS Review of Systems  Constitutional: Negative for appetite change, fatigue and unexpected weight change.  HENT: Negative for congestion, nosebleeds, sneezing, sore throat and trouble swallowing.   Eyes: Negative for itching and visual disturbance.  Respiratory: Negative for cough.   Cardiovascular: Negative for chest pain, palpitations and leg swelling.  Gastrointestinal: Negative for abdominal distention, blood in stool,  diarrhea and nausea.  Genitourinary: Negative for frequency and hematuria.  Musculoskeletal: Negative for back pain, gait problem, joint swelling and neck pain.  Skin: Negative for rash.  Neurological: Negative for dizziness, tremors, speech difficulty and weakness.  Psychiatric/Behavioral: Positive for sleep disturbance. Negative for agitation and dysphoric mood. The patient is not nervous/anxious.     Objective:  BP 136/78 (BP Location: Right Arm, Patient Position: Sitting, Cuff Size: Normal)   Pulse 62   Temp 97.9 F (36.6 C) (Oral)   Ht 5\' 8"  (1.727 m)   Wt 193 lb 1.3 oz (87.6 kg)   SpO2 98%   BMI 29.36 kg/m   BP Readings from Last 3 Encounters:  01/13/18 136/78  01/20/17 128/62  09/01/15 128/76    Wt Readings from Last 3 Encounters:  01/13/18 193 lb 1.3 oz (87.6 kg)  01/20/17 188 lb 8 oz (85.5 kg)  09/01/15 188 lb (85.3 kg)    Physical Exam  Constitutional: He is oriented to person, place, and time. He appears well-developed. No distress.  NAD  HENT:  Mouth/Throat: Oropharynx is clear and moist.  Eyes: Conjunctivae are normal. Pupils are equal, round, and reactive to light.  Neck: Normal range of motion. No JVD present. No thyromegaly present.  Cardiovascular: Normal rate, regular rhythm, normal heart sounds and intact distal pulses. Exam reveals no gallop and no friction rub.  No murmur heard. Pulmonary/Chest: Effort normal and breath sounds normal. No respiratory distress. He has no wheezes. He has no rales. He exhibits  no tenderness.  Abdominal: Soft. Bowel sounds are normal. He exhibits no distension and no mass. There is no tenderness. There is no rebound and no guarding.  Musculoskeletal: Normal range of motion. He exhibits no edema or tenderness.  Lymphadenopathy:    He has no cervical adenopathy.  Neurological: He is alert and oriented to person, place, and time. He has normal reflexes. No cranial nerve deficit. He exhibits normal muscle tone. He displays a  negative Romberg sign. Coordination and gait normal.  Skin: Skin is warm and dry. No rash noted.  Psychiatric: He has a normal mood and affect. His behavior is normal. Judgment and thought content normal.   L trap is tender Pt declined rectal  Lab Results  Component Value Date   WBC 4.9 01/12/2018   HGB 14.9 01/12/2018   HCT 42.9 01/12/2018   PLT 157 01/12/2018   GLUCOSE 111 (H) 01/12/2018   CHOL 160 01/12/2018   TRIG 61.0 01/12/2018   HDL 38.80 (L) 01/12/2018   LDLDIRECT 296.1 08/12/2013   LDLCALC 109 (H) 01/12/2018   ALT 25 01/12/2018   AST 25 01/12/2018   NA 142 01/12/2018   K 3.8 01/12/2018   CL 104 01/12/2018   CREATININE 1.03 01/12/2018   BUN 15 01/12/2018   CO2 30 01/12/2018   TSH 2.49 01/12/2018   PSA 0.00 (L) 01/12/2018   INR 0.99 07/31/2011   HGBA1C 5.8 03/09/2014    Dg Chest 2 View  Result Date: 10/13/2014 CLINICAL DATA:  Persistent cough for 3 or 4 weeks. History of hypertension and coronary artery disease. Initial encounter. EXAM: CHEST  2 VIEW COMPARISON:  Radiographs 08/14/2009 and 12/25/2007. FINDINGS: The heart size and mediastinal contours are normal. The lungs are clear. There is no pleural effusion or pneumothorax. No acute osseous findings are identified. There are stable old right-sided rib fractures and mild degenerative changes throughout the spine. IMPRESSION: Stable chest.  No active cardiopulmonary process Electronically Signed   By: Camie Patience M.D.   On: 10/13/2014 15:18    Assessment & Plan:   There are no diagnoses linked to this encounter. I am having Andrew Dally. Glennon Mac. maintain his aspirin, desloratadine, fluticasone, Vitamin D3, amLODipine, losartan-hydrochlorothiazide, labetalol, ezetimibe-simvastatin, and zolpidem.  No orders of the defined types were placed in this encounter.    Follow-up: No Follow-up on file.  [name], MD

## 2018-01-13 NOTE — Assessment & Plan Note (Addendum)
   Here for medicare wellness/physical  Diet: heart healthy  Physical activity: not sedentary  Depression/mood screen: doing OK  Hearing: intact to whispered voice  Visual acuity: grossly normal, performs annual eye exam  ADLs: capable  Fall risk: low to none  Home safety: good  Cognitive evaluation: intact to orientation, naming, recall and repetition  EOL planning: adv directives, full code/ I agree  I have personally reviewed and have noted  1. The patient's medical, surgical and social history  2. Their use of alcohol, tobacco or illicit drugs  3. Their current medications and supplements  4. The patient's functional ability including ADL's, fall risks, home safety risks and hearing or visual impairment.  5. Diet and physical activities  6. Evidence for depression or mood disorders 7. The roster of all physicians providing medical care to patient - is listed in the Snapshot section of the chart and reviewed today.    Today patient counseled on age appropriate routine health concerns for screening and prevention, each reviewed and up to date or declined. Immunizations reviewed and up to date or declined. Labs ordered and reviewed. Risk factors for depression reviewed and negative. Hearing function and visual acuity are intact. ADLs screened and addressed as needed. Functional ability and level of safety reviewed and appropriate. Education, counseling and referrals performed based on assessed risks today. Patient provided with a copy of personalized plan for preventive services.   Colon due 2021 Dr Fuller Plan Pt declined all shots, rectal exam Eye exam was suggested

## 2018-01-28 ENCOUNTER — Telehealth: Payer: Self-pay | Admitting: Internal Medicine

## 2018-01-28 NOTE — Telephone Encounter (Signed)
Copied from [place] [id number]. Topic: Quick Communication - Rx Refill/Question >> Jan 28, 2018 12:55 PM Scherrie Gerlach wrote: Medication: zolpidem (AMBIEN) 5 MG tablet  [employer] calling to advise they need additional information in order to process this prior and approve. 1. Does the pt need more than a 90 day over 365 days? Does he take on regular basis?  2. Does the dr realize this is a high risk drug for the pt?  Ref no [id number] [place] with [place] [facility] [phone] Ext [phone]  Need to hear something back today. This case will close today if they do not hear back.

## 2018-01-29 NOTE — Telephone Encounter (Signed)
Form faxed

## 2018-04-03 NOTE — Telephone Encounter (Signed)
PA approved through 12/01/18 

## 2018-07-21 ENCOUNTER — Other Ambulatory Visit: Payer: Self-pay | Admitting: Internal Medicine

## 2018-07-21 NOTE — Telephone Encounter (Signed)
sch OV pls

## 2018-07-22 ENCOUNTER — Telehealth: Payer: Self-pay | Admitting: Internal Medicine

## 2018-07-22 NOTE — Telephone Encounter (Signed)
Patient came by the office stating that when he went to pick up his prescription for zolpidem (AMBIEN) 5 MG tablet it was only sent in for #30. He generally gets #90 each time. I explained to him that it was probably because he is due for an appointment with Dr Alain Marion. Based on his last visit in February he was told to follow up in 6 months. I scheduled an appointment for him on September 30th (First available appointment with Dr Alain Marion is the middle of September and patient will be out of town most of the month due to work). Patient would like to know if the remaining #60 tablets could be sent in for him to [facility].

## 2018-07-23 MED ORDER — ZOLPIDEM TARTRATE 5 MG PO TABS: 5.0000 mg | ORAL_TABLET | Freq: Every evening | ORAL | 0 refills | Status: DC | PRN

## 2018-07-23 NOTE — Telephone Encounter (Signed)
Okay to send remaining 60 tablets.  Thank you

## 2018-07-23 NOTE — Telephone Encounter (Signed)
Notified pharmacy spoke w/Wayne (pharmacist) gave MD response updated script for #60.Marland KitchenJohny Joseph

## 2018-08-31 ENCOUNTER — Encounter: Payer: Self-pay | Admitting: Internal Medicine

## 2018-08-31 ENCOUNTER — Other Ambulatory Visit (INDEPENDENT_AMBULATORY_CARE_PROVIDER_SITE_OTHER): Payer: Medicare Other

## 2018-08-31 ENCOUNTER — Ambulatory Visit: Payer: Medicare Other | Admitting: Internal Medicine

## 2018-08-31 DIAGNOSIS — Z8546 Personal history of malignant neoplasm of prostate: Secondary | ICD-10-CM

## 2018-08-31 DIAGNOSIS — I2510 Atherosclerotic heart disease of native coronary artery without angina pectoris: Secondary | ICD-10-CM

## 2018-08-31 DIAGNOSIS — K648 Other hemorrhoids: Secondary | ICD-10-CM

## 2018-08-31 DIAGNOSIS — R079 Chest pain, unspecified: Secondary | ICD-10-CM | POA: Diagnosis not present

## 2018-08-31 DIAGNOSIS — F5101 Primary insomnia: Secondary | ICD-10-CM

## 2018-08-31 DIAGNOSIS — E785 Hyperlipidemia, unspecified: Secondary | ICD-10-CM

## 2018-08-31 DIAGNOSIS — I10 Essential (primary) hypertension: Secondary | ICD-10-CM | POA: Diagnosis not present

## 2018-08-31 DIAGNOSIS — I2583 Coronary atherosclerosis due to lipid rich plaque: Secondary | ICD-10-CM

## 2018-08-31 DIAGNOSIS — R0609 Other forms of dyspnea: Secondary | ICD-10-CM | POA: Insufficient documentation

## 2018-08-31 LAB — BASIC METABOLIC PANEL
BUN: 17 mg/dL (ref 6–23)
CHLORIDE: 102 meq/L (ref 96–112)
CO2: 31 mEq/L (ref 19–32)
Calcium: 9.5 mg/dL (ref 8.4–10.5)
Creatinine, Ser: 1.07 mg/dL (ref 0.40–1.50)
GFR: 71.93 mL/min (ref >60.00)
GLUCOSE: 87 mg/dL (ref 70–99)
POTASSIUM: 4 meq/L (ref 3.5–5.1)
Sodium: 139 mEq/L (ref 135–145)

## 2018-08-31 LAB — CBC WITH DIFFERENTIAL/PLATELET
BASOS PCT: 0.7 % (ref 0.0–3.0)
Basophils Absolute: 0 10*3/uL (ref 0.0–0.1)
EOS PCT: 4.4 % (ref 0.0–5.0)
Eosinophils Absolute: 0.3 10*3/uL (ref 0.0–0.7)
HCT: 43.5 % (ref 39.0–52.0)
Hemoglobin: 14.9 g/dL (ref 13.0–17.0)
LYMPHS ABS: 1.4 10*3/uL (ref 0.7–4.0)
Lymphocytes Relative: 23.3 % (ref 12.0–46.0)
MCHC: 34.3 g/dL (ref 30.0–36.0)
MCV: 87.7 fl (ref 78.0–100.0)
Monocytes Absolute: 0.6 10*3/uL (ref 0.1–1.0)
Monocytes Relative: 10.5 % (ref 3.0–12.0)
Neutro Abs: 3.6 10*3/uL (ref 1.4–7.7)
Neutrophils Relative %: 61.1 % (ref 43.0–77.0)
PLATELETS: 144 10*3/uL — AB (ref 150.0–400.0)
RBC: 4.96 Mil/uL (ref 4.22–5.81)
RDW: 14.8 % (ref 11.5–15.5)
WBC: 6 10*3/uL (ref 4.0–10.5)

## 2018-08-31 LAB — PSA: PSA: 0 ng/mL — ABNORMAL LOW (ref 0.10–4.00)

## 2018-08-31 LAB — HEPATIC FUNCTION PANEL
ALK PHOS: 62 U/L (ref 39–117)
ALT: 27 U/L (ref 0–53)
AST: 23 U/L (ref 0–37)
Albumin: 4.3 g/dL (ref 3.5–5.2)
BILIRUBIN DIRECT: 0.1 mg/dL (ref 0.0–0.3)
BILIRUBIN TOTAL: 0.9 mg/dL (ref 0.2–1.2)
Total Protein: 7 g/dL (ref 6.0–8.3)

## 2018-08-31 MED ORDER — ZOLPIDEM TARTRATE 5 MG PO TABS: 5.0000 mg | ORAL_TABLET | Freq: Every evening | ORAL | 1 refills | Status: DC | PRN

## 2018-08-31 MED ORDER — LOSARTAN POTASSIUM-HCTZ 100-25 MG PO TABS: 1.0000 | ORAL_TABLET | Freq: Every evening | ORAL | 3 refills | Status: DC

## 2018-08-31 MED ORDER — EZETIMIBE-SIMVASTATIN 10-40 MG PO TABS: 1.0000 | ORAL_TABLET | Freq: Every day | ORAL | 3 refills | Status: DC

## 2018-08-31 MED ORDER — LABETALOL HCL 300 MG PO TABS: 150.0000 mg | ORAL_TABLET | Freq: Two times a day (BID) | ORAL | 3 refills | Status: DC

## 2018-08-31 MED ORDER — AMLODIPINE BESYLATE 5 MG PO TABS: 5.0000 mg | ORAL_TABLET | Freq: Every day | ORAL | 3 refills | Status: DC

## 2018-08-31 NOTE — Assessment & Plan Note (Addendum)
Amlodipine, Labetalol, Losartan HCT CT angio

## 2018-08-31 NOTE — Assessment & Plan Note (Signed)
Vytorin 

## 2018-08-31 NOTE — Assessment & Plan Note (Signed)
Amlodipine, Labetalol, Losartan HCT

## 2018-08-31 NOTE — Progress Notes (Signed)
Subjective:  Patient ID: Andrew Joseph., male    DOB: Apr 11, 1946  Age: 72 y.o. MRN: [id number]  CC: No chief complaint on file.   HPI Andrew Joseph. presents for CAD, insomnia, dyslipidemia, insomnia f/u C/o DOE x weeks-months Drivig a lot for work 2-4 hrs one way w/o a stop at times   Outpatient Medications Prior to Visit  Medication Sig Dispense Refill  . amLODipine (NORVASC) 5 MG tablet Take 1 tablet (5 mg total) by mouth daily. 90 tablet 3  . aspirin 325 MG tablet Take 325 mg by mouth every morning.     . Cholecalciferol (VITAMIN D3) 2000 UNITS capsule Take 1 capsule (2,000 Units total) by mouth daily. 100 capsule 3  . desloratadine (CLARINEX) 5 MG tablet Take 1 tablet (5 mg total) by mouth daily. 30 tablet 11  . ezetimibe-simvastatin (VYTORIN) 10-40 MG tablet Take 1 tablet by mouth at bedtime. Patient needs office visit before refills will be given 90 tablet 3  . fluticasone (FLONASE) 50 MCG/ACT nasal spray Place 2 sprays into both nostrils daily. 16 g 3  . labetalol (NORMODYNE) 300 MG tablet Take 0.5 tablets (150 mg total) by mouth 2 (two) times daily. 180 tablet 3  . losartan-hydrochlorothiazide (HYZAAR) 100-25 MG tablet Take 1 tablet by mouth every evening. 90 tablet 3  . zolpidem (AMBIEN) 5 MG tablet Take 1 tablet (5 mg total) by mouth at bedtime as needed. for sleep 60 tablet 0   No facility-administered medications prior to visit.     ROS: Review of Systems  Constitutional: Negative for appetite change, fatigue and unexpected weight change.  HENT: Negative for congestion, nosebleeds, sneezing, sore throat and trouble swallowing.   Eyes: Negative for itching and visual disturbance.  Respiratory: Positive for shortness of breath. Negative for cough.   Cardiovascular: Negative for chest pain, palpitations and leg swelling.  Gastrointestinal: Negative for abdominal distention, blood in stool, diarrhea and nausea.  Genitourinary: Negative for frequency and  hematuria.  Musculoskeletal: Negative for back pain, gait problem, joint swelling and neck pain.  Skin: Negative for rash.  Neurological: Negative for dizziness, tremors, speech difficulty and weakness.  Psychiatric/Behavioral: Negative for agitation, dysphoric mood, sleep disturbance and suicidal ideas. The patient is not nervous/anxious.     Objective:  BP 134/72 (BP Location: Left Arm, Patient Position: Sitting, Cuff Size: Normal)   Pulse (!) 58   Temp 98 F (36.7 C) (Oral)   Ht 5\' 8"  (1.727 m)   Wt 194 lb (88 kg)   SpO2 95%   BMI 29.50 kg/m   BP Readings from Last 3 Encounters:  08/31/18 134/72  01/13/18 136/78  01/20/17 128/62    Wt Readings from Last 3 Encounters:  08/31/18 194 lb (88 kg)  01/13/18 193 lb 1.3 oz (87.6 kg)  01/20/17 188 lb 8 oz (85.5 kg)    Physical Exam  Constitutional: He is oriented to person, place, and time. He appears well-developed. No distress.  NAD  HENT:  Mouth/Throat: Oropharynx is clear and moist.  Eyes: Pupils are equal, round, and reactive to light. Conjunctivae are normal.  Neck: Normal range of motion. No JVD present. No thyromegaly present.  Cardiovascular: Normal rate, regular rhythm, normal heart sounds and intact distal pulses. Exam reveals no gallop and no friction rub.  No murmur heard. Pulmonary/Chest: Effort normal and breath sounds normal. No respiratory distress. He has no wheezes. He has no rales. He exhibits no tenderness.  Abdominal: Soft. Bowel sounds are normal. He  exhibits no distension and no mass. There is no tenderness. There is no rebound and no guarding.  Genitourinary: Rectum normal. Rectal exam shows guaiac negative stool.  Musculoskeletal: Normal range of motion. He exhibits no edema or tenderness.  Lymphadenopathy:    He has no cervical adenopathy.  Neurological: He is alert and oriented to person, place, and time. He has normal reflexes. No cranial nerve deficit. He exhibits normal muscle tone. He displays a  negative Romberg sign. Coordination and gait normal.  Skin: Skin is warm and dry. No rash noted.  Psychiatric: He has a normal mood and affect. His behavior is normal. Judgment and thought content normal.  rectal wnl No prostate Obese  Lab Results  Component Value Date   WBC 4.9 01/12/2018   HGB 14.9 01/12/2018   HCT 42.9 01/12/2018   PLT 157 01/12/2018   GLUCOSE 111 (H) 01/12/2018   CHOL 160 01/12/2018   TRIG 61.0 01/12/2018   HDL 38.80 (L) 01/12/2018   LDLDIRECT 296.1 08/12/2013   LDLCALC 109 (H) 01/12/2018   ALT 25 01/12/2018   AST 25 01/12/2018   NA 142 01/12/2018   K 3.8 01/12/2018   CL 104 01/12/2018   CREATININE 1.03 01/12/2018   BUN 15 01/12/2018   CO2 30 01/12/2018   TSH 2.49 01/12/2018   PSA 0.00 (L) 01/12/2018   INR 0.99 07/31/2011   HGBA1C 5.8 03/09/2014    Dg Chest 2 View  Result Date: 10/13/2014 CLINICAL DATA:  Persistent cough for 3 or 4 weeks. History of hypertension and coronary artery disease. Initial encounter. EXAM: CHEST  2 VIEW COMPARISON:  Radiographs 08/14/2009 and 12/25/2007. FINDINGS: The heart size and mediastinal contours are normal. The lungs are clear. There is no pleural effusion or pneumothorax. No acute osseous findings are identified. There are stable old right-sided rib fractures and mild degenerative changes throughout the spine. IMPRESSION: Stable chest.  No active cardiopulmonary process Electronically Signed   By: Camie Patience M.D.   On: 10/13/2014 15:18    Assessment & Plan:   There are no diagnoses linked to this encounter.   No orders of the defined types were placed in this encounter.    Follow-up: No follow-ups on file.  [name], MD

## 2018-08-31 NOTE — Assessment & Plan Note (Signed)
PSA

## 2018-08-31 NOTE — Assessment & Plan Note (Addendum)
Rectal exam was nl today Colon id due in 2021

## 2018-08-31 NOTE — Assessment & Plan Note (Addendum)
CT angio C/o DOE x weeks-months Drivig a lot for work 2-4 hrs one way w/o a stop at times - r/o PE, CAD Pt declined stress test

## 2018-08-31 NOTE — Assessment & Plan Note (Signed)
Chronic  Zolpidem prn  Potential benefits of a long term benzodiazepines  use as well as potential risks  and complications were explained to the patient and were aknowledged. 

## 2018-09-01 ENCOUNTER — Telehealth: Payer: Self-pay | Admitting: Internal Medicine

## 2018-09-01 NOTE — Telephone Encounter (Signed)
rec'd return call from pt. Re: lab results.  Informed of result notes per Dr. Alain Marion from 08/31/18.  Verb. Understanding.  (this was not charted on result not, as Results were not forwarded to the Triage Pool.)

## 2018-09-02 ENCOUNTER — Other Ambulatory Visit: Payer: Medicare Other

## 2018-09-02 ENCOUNTER — Telehealth: Payer: Self-pay | Admitting: Internal Medicine

## 2018-09-02 DIAGNOSIS — R0609 Other forms of dyspnea: Principal | ICD-10-CM

## 2018-09-02 NOTE — Telephone Encounter (Signed)
Pls ask the pt to do another blood test prior - d-dimer Thx

## 2018-09-02 NOTE — Telephone Encounter (Signed)
LMTCB

## 2018-09-02 NOTE — Telephone Encounter (Signed)
Pt notified and will come in for lab today

## 2018-09-03 LAB — D-DIMER, QUANTITATIVE: D-Dimer, Quant: 0.53 mcg/mL FEU — ABNORMAL HIGH (ref <0.50)

## 2018-09-04 ENCOUNTER — Encounter: Payer: Self-pay | Admitting: Internal Medicine

## 2018-09-04 ENCOUNTER — Other Ambulatory Visit: Payer: Self-pay | Admitting: Family

## 2018-09-04 MED ORDER — PREDNISONE 50 MG PO TABS: ORAL_TABLET | ORAL | 0 refills | Status: DC

## 2018-09-04 NOTE — Addendum Note (Signed)
Addended by: Karren Cobble on: 09/04/2018 10:33 AM   Modules accepted: Orders

## 2018-09-04 NOTE — Telephone Encounter (Signed)
FYI: Pt can't do CT scan until week of 21st. He is scheduled for 10/22 and he is aware of appt

## 2018-09-04 NOTE — Telephone Encounter (Signed)
FYI

## 2018-09-06 NOTE — Telephone Encounter (Signed)
Noted! Thank you

## 2018-09-08 ENCOUNTER — Ambulatory Visit (HOSPITAL_COMMUNITY): Payer: Medicare Other

## 2018-09-22 ENCOUNTER — Encounter (HOSPITAL_COMMUNITY): Payer: Self-pay

## 2018-09-22 ENCOUNTER — Ambulatory Visit (HOSPITAL_COMMUNITY)
Admission: RE | Admit: 2018-09-22 | Discharge: 2018-09-22 | Disposition: A | Discharge status: Home / Self Care | Payer: Medicare Other | Source: Ambulatory Visit | Attending: Internal Medicine | Admitting: Internal Medicine

## 2018-09-22 DIAGNOSIS — M4814 Ankylosing hyperostosis [Forestier], thoracic region: Secondary | ICD-10-CM | POA: Diagnosis not present

## 2018-09-22 DIAGNOSIS — I2583 Coronary atherosclerosis due to lipid rich plaque: Secondary | ICD-10-CM | POA: Insufficient documentation

## 2018-09-22 DIAGNOSIS — E785 Hyperlipidemia, unspecified: Secondary | ICD-10-CM

## 2018-09-22 DIAGNOSIS — R079 Chest pain, unspecified: Secondary | ICD-10-CM | POA: Diagnosis present

## 2018-09-22 DIAGNOSIS — R0609 Other forms of dyspnea: Secondary | ICD-10-CM | POA: Diagnosis not present

## 2018-09-22 DIAGNOSIS — I700 Atherosclerosis of aorta: Secondary | ICD-10-CM | POA: Insufficient documentation

## 2018-09-22 DIAGNOSIS — I2510 Atherosclerotic heart disease of native coronary artery without angina pectoris: Secondary | ICD-10-CM

## 2018-09-22 DIAGNOSIS — N62 Hypertrophy of breast: Secondary | ICD-10-CM | POA: Diagnosis not present

## 2018-09-22 MED ORDER — SODIUM CHLORIDE 0.9 % IJ SOLN
INTRAMUSCULAR | Status: AC
  Filled 2018-09-22: qty 50

## 2018-09-22 MED ORDER — IOPAMIDOL (ISOVUE-370) INJECTION 76%
INTRAVENOUS | Status: AC
  Filled 2018-09-22: qty 100

## 2018-09-22 MED ORDER — IOPAMIDOL (ISOVUE-370) INJECTION 76%
100.0000 mL | Freq: Once | INTRAVENOUS | Status: AC | PRN
  Administered 2018-09-22: 100 mL via INTRAVENOUS

## 2018-10-22 ENCOUNTER — Telehealth: Payer: Self-pay | Admitting: Internal Medicine

## 2018-10-22 NOTE — Telephone Encounter (Signed)
Copied from [place] [id number]. Topic: Quick Communication - See Telephone Encounter >> Oct 22, 2018  4:46 PM Vernona Rieger wrote: CRM for notification. See Telephone encounter for: 10/22/18.  Ellis with [facility] called and said that losartan-hydrochlorothiazide (HYZAAR) 100-25 MG tablet is on back order and he is wanting to know could a new script be called in for " Lisinopril with HTZ. Please Advise.

## 2018-10-22 NOTE — Telephone Encounter (Signed)
See note below

## 2018-10-23 MED ORDER — IRBESARTAN-HYDROCHLOROTHIAZIDE 150-12.5 MG PO TABS: 1.0000 | ORAL_TABLET | Freq: Every day | ORAL | 3 refills | Status: DC

## 2018-10-23 NOTE — Telephone Encounter (Signed)
I'll email Irbesartan HCT Rx Thx

## 2018-11-12 ENCOUNTER — Other Ambulatory Visit: Payer: Self-pay | Admitting: Internal Medicine

## 2019-03-31 ENCOUNTER — Other Ambulatory Visit: Payer: Self-pay | Admitting: Internal Medicine

## 2019-04-19 ENCOUNTER — Ambulatory Visit
Admission: EM | Admit: 2019-04-19 | Discharge: 2019-04-19 | Disposition: A | Discharge status: Home / Self Care | Payer: Medicare Other | Attending: Physician Assistant | Admitting: Physician Assistant

## 2019-04-19 DIAGNOSIS — Z23 Encounter for immunization: Secondary | ICD-10-CM

## 2019-04-19 DIAGNOSIS — S61311A Laceration without foreign body of left index finger with damage to nail, initial encounter: Secondary | ICD-10-CM

## 2019-04-19 MED ORDER — TETANUS-DIPHTH-ACELL PERTUSSIS 5-2.5-18.5 LF-MCG/0.5 IM SUSP
0.5000 mL | Freq: Once | INTRAMUSCULAR | Status: AC
  Administered 2019-04-19: 0.5 mL via INTRAMUSCULAR

## 2019-04-19 MED ORDER — LIDOCAINE HCL (PF) 1 % IJ SOLN
2.0000 mL | Freq: Once | INTRAMUSCULAR | Status: AC
  Administered 2019-04-19: 2 mL

## 2019-04-19 NOTE — ED Triage Notes (Signed)
Pt states slammed his lt 2nd digit, lac noted with bleeding controlled

## 2019-04-19 NOTE — Discharge Instructions (Signed)
Return in 8 days for suture removal °

## 2019-04-19 NOTE — ED Provider Notes (Signed)
[facility]    CSN: [id number] Arrival date & time: 04/19/19  1632     History   Chief Complaint Chief Complaint  Patient presents with  . Laceration    HPI Andrew Deleo. is a 73 y.o. male.   The history is provided by the patient. No language interpreter was used.  Laceration  Location:  Finger Finger laceration location:  L index finger Length:  1.2 Depth:  Cutaneous Quality: straight   Bleeding: controlled   Time since incident:  1 hour Laceration mechanism:  Blunt object Pain details:    Quality:  Aching   Severity:  Mild   Timing:  Constant   Progression:  Worsening Foreign body present:  No foreign bodies Relieved by:  Nothing Tetanus status:  Out of date Associated symptoms: swelling     Past Medical History:  Diagnosis Date  . Allergic rhinitis, cause unspecified   . Anxiety   . Blood in stool   . BPH (benign prostatic hyperplasia)   . CAD (coronary artery disease)    DES mid lad 08/15/2009  . Cancer [facility])    prostate  . Chest pain, unspecified   . Colon polyps   . CVA (cerebrovascular accident due to intracerebral hemorrhage) [facility]) 2007 [place]  . History of prostate cancer 2009   Dr [name]  . HTN (hypertension)   . Hypercholesterolemia   . Hyperlipidemia   . Hypokalemia   . Insomnia, persistent   . Neoplasm of uncertain behavior of skin   . Stroke [facility])    2007  . Syncope   . Tubular adenoma of colon 2010    Patient Active Problem List   Diagnosis Date Noted  . DOE (dyspnea on exertion) 08/31/2018  . Cough 10/11/2014  . Thrush 10/11/2014  . Ocular migraine 07/07/2014  . Vision, loss, sudden 07/07/2014  . Shoulder pain, left 07/07/2014  . Well adult exam 03/11/2014  . Hypokalemia 09/24/2013  . Hemorrhoids, internal, with bleeding and grade 2 prolapse 08/12/2013  . Thrombocytopenia, unspecified ([place]) 08/12/2013  . Seizure ([place]) 07/30/2011  . Erectile dysfunction 06/25/2011  . CBC, ABNORMAL 09/13/2009  .  NEOPLASM, SKIN, UNCERTAIN BEHAVIOR 51/76/1607  . Insomnia 02/22/2009  . Dyslipidemia 11/14/2008  . Essential hypertension 11/14/2008  . Coronary atherosclerosis 11/14/2008  . Allergic rhinitis 11/14/2008  . PROSTATE CANCER, HX OF 11/14/2008    Past Surgical History:  Procedure Laterality Date  . ANGIOPLASTY     with stents  . COLONOSCOPY    . PROSTATECTOMY  2009  . TONSILLECTOMY         Home Medications    Prior to Admission medications   Medication Sig Start Date End Date Taking? Authorizing Provider  amLODipine (NORVASC) 5 MG tablet Take 1 tablet (5 mg total) by mouth daily. 08/31/18   [name], [name], MD  aspirin 325 MG tablet Take 325 mg by mouth every morning.     [provider]  Cholecalciferol (VITAMIN D3) 2000 UNITS capsule Take 1 capsule (2,000 Units total) by mouth daily. 09/01/15   [name], [name], MD  desloratadine (CLARINEX) 5 MG tablet Take 1 tablet (5 mg total) by mouth daily. 10/11/14   Biagio Borg, MD  ezetimibe-simvastatin (VYTORIN) 10-40 MG tablet Take 1 tablet by mouth at bedtime. Patient needs office visit before refills will be given 08/31/18   [name], [name], MD  fluticasone (FLONASE) 50 MCG/ACT nasal spray Place 2 sprays into both nostrils daily. 09/01/15   [name], [name], MD  irbesartan-hydrochlorothiazide (AVALIDE) 150-12.5 MG tablet Take 1 tablet by mouth daily. 10/23/18   [name], [name], MD  labetalol (NORMODYNE) 300 MG tablet Take 0.5 tablets (150 mg total) by mouth 2 (two) times daily. 08/31/18   [name], [name], MD  predniSONE (DELTASONE) 50 MG tablet Take at 13 hours, 7 hours and 1 hour prior to study 09/04/18   Marrian Salvage, FNP  triamcinolone cream (KENALOG) 0.5 % Apply to affected area twice daily as needed 11/12/18   [name], [name], MD  zolpidem (AMBIEN) 5 MG tablet TAKE 1 TABLET BY MOUTH EACH NIGHT AT BEDTIME AS NEEDED FOR SLEEP 04/01/19   [name], [name], MD    Family History Family  History  Problem Relation Age of Onset  . Stroke Mother   . Heart disease Father        MI  . Diabetes Brother        Obese  . Colon polyps Neg Hx   . Esophageal cancer Neg Hx   . Rectal cancer Neg Hx   . Stomach cancer Neg Hx     Social History Social History   Tobacco Use  . Smoking status: Never Smoker  . Smokeless tobacco: Never Used  Substance Use Topics  . Alcohol use: Yes    Comment: Occasional glass of wine  . Drug use: No     Allergies   Iodine and Shellfish allergy   Review of Systems Review of Systems  Skin: Positive for wound.  All other systems reviewed and are negative.    Physical Exam Triage Vital Signs ED Triage Vitals  Enc Vitals Group     BP 04/19/19 1658 (!) 145/87     Pulse Rate 04/19/19 1658 63     Resp 04/19/19 1658 18     Temp 04/19/19 1658 97.8 F (36.6 C)     Temp Source 04/19/19 1658 Oral     SpO2 04/19/19 1658 97 %     Weight --      Height --      Head Circumference --      Peak Flow --      Pain Score 04/19/19 1659 0     Pain Loc --      Pain Edu? --      Excl. in [place]? --    No data found.  Updated Vital Signs BP (!) 145/87 (BP Location: Left Arm)   Pulse 63   Temp 97.8 F (36.6 C) (Oral)   Resp 18   SpO2 97%   Visual Acuity Right Eye Distance:   Left Eye Distance:   Bilateral Distance:    Right Eye Near:   Left Eye Near:    Bilateral Near:     Physical Exam Vitals signs reviewed.  Constitutional:      Appearance: He is normal weight.  HENT:     Head: Normocephalic.  Neck:     Musculoskeletal: Normal range of motion.  Musculoskeletal:        General: Tenderness present.     Comments: 1.2cm laceration  Let index finger, from nv nad ns intact   Skin:    General: Skin is warm.  Neurological:     General: No focal deficit present.     Mental Status: He is alert.  Psychiatric:        Mood and Affect: Mood normal.      UC Treatments / Results  Labs (all labs ordered are listed, but only abnormal  results are  displayed) Labs Reviewed - No data to display  EKG None  Radiology No results found.  Procedures Laceration Repair Date/Time: 04/19/2019 5:42 PM Performed by: Fransico Meadow, PA-C Authorized by: Fransico Meadow, PA-C   Consent:    Consent obtained:  Verbal   Consent given by:  Patient   Risks discussed:  Infection, need for additional repair, pain, poor cosmetic result and poor wound healing   Alternatives discussed:  No treatment and delayed treatment Universal protocol:    Procedure explained and questions answered to patient or proxy's satisfaction: yes     Relevant documents present and verified: yes     Test results available and properly labeled: yes     Imaging studies available: yes     Required blood products, implants, devices, and special equipment available: yes     Site/side marked: yes     Immediately prior to procedure, a time out was called: yes     Patient identity confirmed:  Verbally with patient Anesthesia (see MAR for exact dosages):    Anesthesia method:  Local infiltration Laceration details:    Location:  Finger   Finger location:  L index finger   Length (cm):  1.2   Depth (mm):  3 Repair type:    Repair type:  Simple Pre-procedure details:    Preparation:  Patient was prepped and draped in usual sterile fashion Exploration:    Wound exploration: wound explored through full range of motion     Contaminated: no   Treatment:    Area cleansed with:  Shur-Clens   Amount of cleaning:  Standard   Visualized foreign bodies/material removed: no   Skin repair:    Repair method:  Sutures   Suture size:  5-0   Suture material:  Prolene   Suture technique:  Simple interrupted   Number of sutures:  3 Approximation:    Approximation:  Loose Post-procedure details:    Patient tolerance of procedure:  Tolerated well, no immediate complications   (including critical care time)  Medications Ordered in UC Medications  Tdap (BOOSTRIX)  injection 0.5 mL (0.5 mLs Intramuscular Given 04/19/19 1720)  lidocaine (PF) (XYLOCAINE) 1 % injection 2 mL (2 mLs Other Given 04/19/19 1727)    Initial Impression / Assessment and Plan / UC Course  I have reviewed the triage vital signs and the nursing notes.  Pertinent labs & imaging results that were available during my care of the patient were reviewed by me and considered in my medical decision making (see chart for details).     Suture removal in 8 days  Final Clinical Impressions(s) / UC Diagnoses   Final diagnoses:  Laceration of left index finger without foreign body with damage to nail, initial encounter     Discharge Instructions     Return in 8 days for suture removal    ED Prescriptions    None     Controlled Substance Prescriptions [place] [email or web address].[email or web address]  Substance Registry consulted? Not Applicable   Fransico Meadow, [place] 04/19/19 1752

## 2019-04-27 ENCOUNTER — Encounter: Payer: Self-pay | Admitting: Emergency Medicine

## 2019-04-27 ENCOUNTER — Ambulatory Visit
Admission: EM | Admit: 2019-04-27 | Discharge: 2019-04-27 | Disposition: A | Discharge status: Home / Self Care | Payer: Medicare Other | Attending: Physician Assistant | Admitting: Physician Assistant

## 2019-04-27 ENCOUNTER — Other Ambulatory Visit: Payer: Self-pay

## 2019-04-27 DIAGNOSIS — Z4802 Encounter for removal of sutures: Secondary | ICD-10-CM | POA: Diagnosis not present

## 2019-04-27 NOTE — ED Triage Notes (Signed)
Pt presents 8 days after ER visit for removal of sutures to left 2nd digit.  Area is well healed, no redness noted, edges are approximated.  Area cleaned, sutures removed.

## 2019-06-17 ENCOUNTER — Other Ambulatory Visit: Payer: Self-pay | Admitting: Internal Medicine

## 2019-07-01 ENCOUNTER — Encounter: Payer: Self-pay | Admitting: Internal Medicine

## 2019-07-01 ENCOUNTER — Other Ambulatory Visit: Payer: Self-pay

## 2019-07-01 ENCOUNTER — Ambulatory Visit (INDEPENDENT_AMBULATORY_CARE_PROVIDER_SITE_OTHER): Payer: Medicare Other | Admitting: Internal Medicine

## 2019-07-01 ENCOUNTER — Other Ambulatory Visit (INDEPENDENT_AMBULATORY_CARE_PROVIDER_SITE_OTHER): Payer: Medicare Other

## 2019-07-01 VITALS — BP 140/80 | HR 64 | Temp 97.7°F | Ht 68.0 in | Wt 184.0 lb

## 2019-07-01 DIAGNOSIS — R252 Cramp and spasm: Secondary | ICD-10-CM

## 2019-07-01 DIAGNOSIS — I2510 Atherosclerotic heart disease of native coronary artery without angina pectoris: Secondary | ICD-10-CM | POA: Diagnosis not present

## 2019-07-01 DIAGNOSIS — E785 Hyperlipidemia, unspecified: Secondary | ICD-10-CM | POA: Diagnosis not present

## 2019-07-01 DIAGNOSIS — I10 Essential (primary) hypertension: Secondary | ICD-10-CM | POA: Diagnosis not present

## 2019-07-01 DIAGNOSIS — Z0000 Encounter for general adult medical examination without abnormal findings: Secondary | ICD-10-CM | POA: Diagnosis not present

## 2019-07-01 DIAGNOSIS — C61 Malignant neoplasm of prostate: Secondary | ICD-10-CM

## 2019-07-01 DIAGNOSIS — I2583 Coronary atherosclerosis due to lipid rich plaque: Secondary | ICD-10-CM

## 2019-07-01 LAB — URINALYSIS
Bilirubin Urine: NEGATIVE
Hgb urine dipstick: NEGATIVE
Ketones, ur: NEGATIVE
Leukocytes,Ua: NEGATIVE
Nitrite: NEGATIVE
Specific Gravity, Urine: 1.02 (ref 1.000–1.030)
Total Protein, Urine: NEGATIVE
Urine Glucose: NEGATIVE
Urobilinogen, UA: 0.2 (ref 0.0–1.0)
pH: 6.5 (ref 5.0–8.0)

## 2019-07-01 LAB — CBC WITH DIFFERENTIAL/PLATELET
Basophils Absolute: 0 10*3/uL (ref 0.0–0.1)
Basophils Relative: 0.6 % (ref 0.0–3.0)
Eosinophils Absolute: 0.2 10*3/uL (ref 0.0–0.7)
Eosinophils Relative: 3.2 % (ref 0.0–5.0)
HCT: 44.3 % (ref 39.0–52.0)
Hemoglobin: 15.1 g/dL (ref 13.0–17.0)
Lymphocytes Relative: 30.1 % (ref 12.0–46.0)
Lymphs Abs: 1.6 10*3/uL (ref 0.7–4.0)
MCHC: 34.2 g/dL (ref 30.0–36.0)
MCV: 89.7 fl (ref 78.0–100.0)
Monocytes Absolute: 0.6 10*3/uL (ref 0.1–1.0)
Monocytes Relative: 10.6 % (ref 3.0–12.0)
Neutro Abs: 3 10*3/uL (ref 1.4–7.7)
Neutrophils Relative %: 55.5 % (ref 43.0–77.0)
Platelets: 143 10*3/uL — ABNORMAL LOW (ref 150.0–400.0)
RBC: 4.94 Mil/uL (ref 4.22–5.81)
RDW: 14.9 % (ref 11.5–15.5)
WBC: 5.4 10*3/uL (ref 4.0–10.5)

## 2019-07-01 LAB — TSH: TSH: 1.88 u[IU]/mL (ref 0.35–4.50)

## 2019-07-01 LAB — BASIC METABOLIC PANEL
BUN: 16 mg/dL (ref 6–23)
CO2: 29 mEq/L (ref 19–32)
Calcium: 9.8 mg/dL (ref 8.4–10.5)
Chloride: 103 mEq/L (ref 96–112)
Creatinine, Ser: 1.17 mg/dL (ref 0.40–1.50)
GFR: 60.91 mL/min (ref >60.00)
Glucose, Bld: 87 mg/dL (ref 70–99)
Potassium: 4.2 mEq/L (ref 3.5–5.1)
Sodium: 140 mEq/L (ref 135–145)

## 2019-07-01 LAB — LIPID PANEL
Cholesterol: 188 mg/dL (ref 0–200)
HDL: 46.2 mg/dL (ref >39.00)
LDL Cholesterol: 124 mg/dL — ABNORMAL HIGH (ref 0–99)
NonHDL: 142.1
Total CHOL/HDL Ratio: 4
Triglycerides: 91 mg/dL (ref 0.0–149.0)
VLDL: 18.2 mg/dL (ref 0.0–40.0)

## 2019-07-01 LAB — HEPATIC FUNCTION PANEL
ALT: 28 U/L (ref 0–53)
AST: 24 U/L (ref 0–37)
Albumin: 4.6 g/dL (ref 3.5–5.2)
Alkaline Phosphatase: 71 U/L (ref 39–117)
Bilirubin, Direct: 0.1 mg/dL (ref 0.0–0.3)
Total Bilirubin: 0.8 mg/dL (ref 0.2–1.2)
Total Protein: 6.9 g/dL (ref 6.0–8.3)

## 2019-07-01 LAB — MAGNESIUM: Magnesium: 2.1 mg/dL (ref 1.5–2.5)

## 2019-07-01 LAB — PSA: PSA: 0 ng/mL — ABNORMAL LOW (ref 0.10–4.00)

## 2019-07-01 MED ORDER — LABETALOL HCL 300 MG PO TABS: 150.0000 mg | ORAL_TABLET | Freq: Two times a day (BID) | ORAL | 3 refills | Status: DC

## 2019-07-01 MED ORDER — AMLODIPINE BESYLATE 5 MG PO TABS: 5.0000 mg | ORAL_TABLET | Freq: Every day | ORAL | 3 refills | Status: DC

## 2019-07-01 MED ORDER — TRIAMCINOLONE ACETONIDE 0.5 % EX CREA: TOPICAL_CREAM | CUTANEOUS | 0 refills | Status: DC

## 2019-07-01 MED ORDER — TIZANIDINE HCL 4 MG PO TABS: 4.0000 mg | ORAL_TABLET | Freq: Three times a day (TID) | ORAL | 0 refills | Status: DC | PRN

## 2019-07-01 MED ORDER — EZETIMIBE-SIMVASTATIN 10-40 MG PO TABS: 1.0000 | ORAL_TABLET | Freq: Every day | ORAL | 3 refills | Status: DC

## 2019-07-01 MED ORDER — IRBESARTAN-HYDROCHLOROTHIAZIDE 150-12.5 MG PO TABS: 1.0000 | ORAL_TABLET | Freq: Every day | ORAL | 3 refills | Status: DC

## 2019-07-01 MED ORDER — ZOLPIDEM TARTRATE 5 MG PO TABS: ORAL_TABLET | ORAL | 1 refills | Status: DC

## 2019-07-01 MED ORDER — POTASSIUM CHLORIDE ER 8 MEQ PO TBCR: 8.0000 meq | EXTENDED_RELEASE_TABLET | Freq: Every day | ORAL | 1 refills | Status: DC

## 2019-07-01 NOTE — Assessment & Plan Note (Signed)
Card ref 

## 2019-07-01 NOTE — Patient Instructions (Addendum)
Take Magnesium daily Tylenol PM as needed for cramps    These suggestions will probably help you to improve your metabolism if you are not overweight and to lose weight if you are overweight: 1.  Reduce your consumption of sugars and starches.  Eliminate high fructose corn syrup from your diet.  Reduce your consumption of processed foods.  For desserts try to have seasonal fruits, berries, nuts, cheeses or dark chocolate with more than 70% cacao. 2.  Do not snack 3.  You do not have to eat breakfast.  If you choose to have breakfast-eat plain greek yogurt, eggs, oatmeal (without sugar) 4.  Drink water, freshly brewed unsweetened tea (green, black or herbal) or coffee.  Do not drink sodas including diet sodas , juices, beverages sweetened with artificial sweeteners. 5.  Reduce your consumption of refined grains. 6.  Avoid protein drinks such as Optifast, Slim fast etc. Eat chicken, fish, meat, dairy and beans for your sources of protein 7.  Natural unprocessed fats like cold pressed virgin olive oil, butter, coconut oil are good for you.  Eat avocados 8.  Increase your consumption of fiber.  Fruits, berries, vegetables, whole grains, flaxseeds, Chia seeds, beans, popcorn, nuts, oatmeal are good sources of fiber 9.  Use vinegar in your diet, i.e. apple cider vinegar, red wine or balsamic vinegar 10.  You can try fasting.  For example you can skip breakfast and lunch every other day (24-hour fast) 11.  Stress reduction, good night sleep, relaxation, meditation, yoga and other physical activity is likely to help you to maintain low weight too. 12.  If you drink alcohol, limit your alcohol intake to no more than 2 drinks a day.   Mediterranean diet is good for you. ([employer] has a typical Mediterranean cuisine menu) The Mediterranean diet is a way of eating based on the traditional cuisine of countries bordering the [employer]. While there is no single definition of the Mediterranean  diet, it is typically high in vegetables, fruits, whole grains, beans, nut and seeds, and olive oil. The main components of Mediterranean diet include: [name] Daily consumption of vegetables, fruits, whole grains and healthy fats  . Weekly intake of fish, poultry, beans and eggs  . Moderate portions of dairy products  . Limited intake of red meat Other important elements of the Mediterranean diet are sharing meals with family and friends, enjoying a glass of red wine and being physically active. Health benefits of a Mediterranean diet: A traditional Mediterranean diet consisting of large quantities of fresh fruits and vegetables, nuts, fish and olive oil-coupled with physical activity-can reduce your risk of serious mental and physical health problems by: Preventing heart disease and strokes. Following a Mediterranean diet limits your intake of refined breads, processed foods, and red meat, and encourages drinking red wine instead of hard liquor-all factors that can help prevent heart disease and stroke. Keeping you agile. If you're an older adult, the nutrients gained with a Mediterranean diet may reduce your risk of developing muscle weakness and other signs of frailty by about 70 percent. Reducing the risk of Alzheimer's. Research suggests that the [place] diet may improve cholesterol, blood sugar levels, and overall blood vessel health, which in turn may reduce your risk of Alzheimer's disease or dementia. Halving the risk of Parkinson's disease. The high levels of antioxidants in the Mediterranean diet can prevent cells from undergoing a damaging process called oxidative stress, thereby cutting the risk of Parkinson's disease in half. Increasing longevity. By reducing  your risk of developing heart disease or cancer with the Mediterranean diet, you're reducing your risk of death at any age by 20%. Protecting against type 2 diabetes. A Mediterranean diet is rich in fiber which digests slowly,  prevents huge swings in blood sugar, and can help you maintain a healthy weight.    Cabbage soup recipe that will not make you gain weight: Take 1 small head of cabbage, 1 average pack of celery, 4 green peppers, 4 onions, 2 cans diced tomatoes (they are not available without salt), salt and spices to taste.  Chop cabbage, celery, peppers and onions.  And tomatoes and 2-2.5 liters (2.5 quarts) of water so that it would just cover the vegetables.  Bring to boil.  Add spices and salt.  Turn heat to low/medium and simmer for 20-25 minutes.  Naturally, you can make a smaller batch and change some of the ingredients.

## 2019-07-01 NOTE — Progress Notes (Signed)
Subjective:  Patient ID: Andrew Essex., male    DOB: 04/06/1946  Age: 73 y.o. MRN: [id number]  CC: No chief complaint on file.   HPI Andrew Essex. presents for HTN, insomnia, CAD f/u C/o a cramp  Outpatient Medications Prior to Visit  Medication Sig Dispense Refill  . amLODipine (NORVASC) 5 MG tablet Take 1 tablet (5 mg total) by mouth daily. 90 tablet 3  . aspirin 325 MG tablet Take 325 mg by mouth every morning.     . Cholecalciferol (VITAMIN D3) 2000 UNITS capsule Take 1 capsule (2,000 Units total) by mouth daily. 100 capsule 3  . desloratadine (CLARINEX) 5 MG tablet Take 1 tablet (5 mg total) by mouth daily. 30 tablet 11  . ezetimibe-simvastatin (VYTORIN) 10-40 MG tablet Take 1 tablet by mouth at bedtime. Patient needs office visit before refills will be given 90 tablet 3  . fluticasone (FLONASE) 50 MCG/ACT nasal spray Place 2 sprays into both nostrils daily. 16 g 3  . irbesartan-hydrochlorothiazide (AVALIDE) 150-12.5 MG tablet Take 1 tablet by mouth daily. 90 tablet 3  . labetalol (NORMODYNE) 300 MG tablet Take 0.5 tablets (150 mg total) by mouth 2 (two) times daily. 180 tablet 3  . triamcinolone cream (KENALOG) 0.5 % Apply to affected area twice daily as needed 45 g 0  . zolpidem (AMBIEN) 5 MG tablet TAKE 1 TABLET BY MOUTH EACH NIGHT AT BEDTIME AS NEEDED FOR SLEEP 30 tablet 0  . predniSONE (DELTASONE) 50 MG tablet Take at 13 hours, 7 hours and 1 hour prior to study (Patient not taking: Reported on 07/01/2019) 3 tablet 0   No facility-administered medications prior to visit.     ROS: Review of Systems  Constitutional: Negative for appetite change, fatigue and unexpected weight change.  HENT: Negative for congestion, nosebleeds, sneezing, sore throat and trouble swallowing.   Eyes: Negative for itching and visual disturbance.  Respiratory: Negative for cough.   Cardiovascular: Negative for chest pain, palpitations and leg swelling.  Gastrointestinal: Negative for  abdominal distention, blood in stool, diarrhea and nausea.  Genitourinary: Negative for frequency and hematuria.  Musculoskeletal: Negative for back pain, gait problem, joint swelling and neck pain.  Skin: Negative for rash.  Neurological: Negative for dizziness, tremors, speech difficulty and weakness.  Psychiatric/Behavioral: Negative for agitation, dysphoric mood, sleep disturbance and suicidal ideas. The patient is not nervous/anxious.    He had a bad cramp in the R thigh x 20 min. No swelling   Objective:  BP 140/80 (BP Location: Left Arm, Patient Position: Sitting, Cuff Size: Large)   Pulse 64   Temp 97.7 F (36.5 C) (Oral)   Ht 5\' 8"  (1.727 m)   Wt 184 lb (83.5 kg)   SpO2 97%   BMI 27.98 kg/m   BP Readings from Last 3 Encounters:  07/01/19 140/80  04/19/19 (!) 145/87  08/31/18 134/72    Wt Readings from Last 3 Encounters:  07/01/19 184 lb (83.5 kg)  08/31/18 194 lb (88 kg)  01/13/18 193 lb 1.3 oz (87.6 kg)    Physical Exam Constitutional:      General: He is not in acute distress.    Appearance: He is well-developed.     Comments: NAD  Eyes:     Conjunctiva/sclera: Conjunctivae normal.     Pupils: Pupils are equal, round, and reactive to light.  Neck:     Musculoskeletal: Normal range of motion.     Thyroid: No thyromegaly.     Vascular:  No JVD.  Cardiovascular:     Rate and Rhythm: Normal rate and regular rhythm.     Heart sounds: Normal heart sounds. No murmur. No friction rub. No gallop.   Pulmonary:     Effort: Pulmonary effort is normal. No respiratory distress.     Breath sounds: Normal breath sounds. No wheezing or rales.  Chest:     Chest wall: No tenderness.  Abdominal:     General: Bowel sounds are normal. There is no distension.     Palpations: Abdomen is soft. There is no mass.     Tenderness: There is no abdominal tenderness. There is no guarding or rebound.  Musculoskeletal: Normal range of motion.        General: No tenderness.   Lymphadenopathy:     Cervical: No cervical adenopathy.  Skin:    General: Skin is warm and dry.     Findings: No rash.  Neurological:     Mental Status: He is alert and oriented to person, place, and time.     Cranial Nerves: No cranial nerve deficit.     Motor: No abnormal muscle tone.     Coordination: Coordination normal.     Gait: Gait normal.     Deep Tendon Reflexes: Reflexes are normal and symmetric.  Psychiatric:        Behavior: Behavior normal.        Thought Content: Thought content normal.        Judgment: Judgment normal.   B LEs NT, no swelling  Lab Results  Component Value Date   WBC 6.0 08/31/2018   HGB 14.9 08/31/2018   HCT 43.5 08/31/2018   PLT 144.0 (L) 08/31/2018   GLUCOSE 87 08/31/2018   CHOL 160 01/12/2018   TRIG 61.0 01/12/2018   HDL 38.80 (L) 01/12/2018   LDLDIRECT 296.1 08/12/2013   LDLCALC 109 (H) 01/12/2018   ALT 27 08/31/2018   AST 23 08/31/2018   NA 139 08/31/2018   K 4.0 08/31/2018   CL 102 08/31/2018   CREATININE 1.07 08/31/2018   BUN 17 08/31/2018   CO2 31 08/31/2018   TSH 2.49 01/12/2018   PSA 0.00 (L) 08/31/2018   INR 0.99 07/31/2011   HGBA1C 5.8 03/09/2014    No results found.  Assessment & Plan:   There are no diagnoses linked to this encounter.   No orders of the defined types were placed in this encounter.    Follow-up: No follow-ups on file.  [name], MD

## 2019-07-04 ENCOUNTER — Encounter: Payer: Self-pay | Admitting: Internal Medicine

## 2019-07-04 DIAGNOSIS — R252 Cramp and spasm: Secondary | ICD-10-CM | POA: Insufficient documentation

## 2019-07-04 NOTE — Assessment & Plan Note (Signed)
He had a bad cramp in the R thigh x 20 min Tizanidine, Mg, KCl

## 2019-07-05 ENCOUNTER — Encounter: Payer: Self-pay | Admitting: *Deleted

## 2019-09-07 ENCOUNTER — Ambulatory Visit (INDEPENDENT_AMBULATORY_CARE_PROVIDER_SITE_OTHER): Payer: Medicare Other | Admitting: Cardiology

## 2019-09-07 ENCOUNTER — Encounter: Payer: Self-pay | Admitting: Cardiology

## 2019-09-07 ENCOUNTER — Other Ambulatory Visit: Payer: Self-pay

## 2019-09-07 VITALS — BP 117/68 | HR 63 | Temp 97.1°F | Ht 68.0 in | Wt 181.0 lb

## 2019-09-07 DIAGNOSIS — I2510 Atherosclerotic heart disease of native coronary artery without angina pectoris: Secondary | ICD-10-CM

## 2019-09-07 DIAGNOSIS — E7800 Pure hypercholesterolemia, unspecified: Secondary | ICD-10-CM | POA: Diagnosis not present

## 2019-09-07 DIAGNOSIS — Z8249 Family history of ischemic heart disease and other diseases of the circulatory system: Secondary | ICD-10-CM

## 2019-09-07 DIAGNOSIS — I10 Essential (primary) hypertension: Secondary | ICD-10-CM

## 2019-09-07 DIAGNOSIS — Z79899 Other long term (current) drug therapy: Secondary | ICD-10-CM | POA: Diagnosis not present

## 2019-09-07 DIAGNOSIS — Z7189 Other specified counseling: Secondary | ICD-10-CM

## 2019-09-07 MED ORDER — ASPIRIN EC 81 MG PO TBEC: 81.0000 mg | DELAYED_RELEASE_TABLET | Freq: Every day | ORAL | 3 refills | Status: DC

## 2019-09-07 MED ORDER — ROSUVASTATIN CALCIUM 20 MG PO TABS: 20.0000 mg | ORAL_TABLET | Freq: Every day | ORAL | 3 refills | Status: DC

## 2019-09-07 NOTE — Patient Instructions (Signed)
Medication Instructions:  Stop: Vytorin Start: Crestor 20 mg daily Decrease: Aspirin to 81 mg daily  If you need a refill on your cardiac medications before your next appointment, please call your pharmacy.   Lab work: Your physician recommends that you return for lab work in 3 months (Lipid, LFT)  If you have labs (blood work) drawn today and your tests are completely normal, you will receive your results only by: [name] MyChart Message (if you have MyChart) OR . A paper copy in the mail If you have any lab test that is abnormal or we need to change your treatment, we will call you to review the results.  Testing/Procedures: None  Follow-Up: At [facility], you and your health needs are our priority.  As part of our continuing mission to provide you with exceptional heart care, we have created designated Provider Care Teams.  These Care Teams include your primary Cardiologist (physician) and Advanced Practice Providers (APPs -  Physician Assistants and Nurse Practitioners) who all work together to provide you with the care you need, when you need it. You will need a follow up appointment in 1 years.  Please call our office 2 months in advance to schedule this appointment.  You may see Dr. Harrell Gave or one of the following Advanced Practice Providers on your designated Care Team:   Rosaria Ferries, PA-C . Jory Sims, DNP, ANP

## 2019-09-07 NOTE — Progress Notes (Signed)
Cardiology Office Note:    Date:  09/07/2019   ID:  Andrew Joseph., DOB 1946/09/29, MRN [id number]  PCP:  [name], MD  Cardiologist:  No primary care provider on file.  Referring MD: [name], [name], MD   CC: new patient consult for management of CAD  History of Present Illness:    Andrew Joseph. is a 73 y.o. male with a hx of CAD with remote stent who is seen as a new consult at the request of [name], [name], MD for the evaluation and management of CAD.  Patient concerns today: Dr. Olevia Perches placed a stent 10 years ago, asked Dr. Alain Marion if he needed it checked/evaluated  Workup reviewed: Cath 07/31/2011 Left main coronary artery was a very short segment.  There was near side- by-side separate ostia.  No significant disease.  The proximal LAD had 30% to 40% tubular disease.  The mid LAD had 50% multiple discrete lesions.  This was unchanged from the previous catheterization done in 2010.  There was a widely patent stent in the distal LAD.  The LAD was a large artery that wrapped the apex.  The LAD actually supplied most of the PDA territory, so it was difficult to know what dominance the patient was.  Right coronary artery was small and had no significant disease.  The circumflex coronary artery was somewhat larger than right coronary artery, but again the PDA was mostly supplied by the distal LAD.  There was 20% to 30% ostial lesions proximally.  The ostium of the first obtuse marginal branch had a 40% lesion.  The patient also had a very large diagonal branch, which did not have significant disease and provided most of the anterolateral wall.  Left ventriculography.  Left ventriculography was normal.  EF was 60%. There was no gradient across the aortic valve and no MR.  Aortic pressure was 132/71.  LV pressure was somewhat inaccurate with the LV tracing at 160/11.  IMPRESSION:  The patient has continued moderate disease in the mid  LAD after the diagonal takeoff.  I closely reviewed his previous film from 2010 and there has been no change in this disease.  His stent is widely patent and he does not have any new disease in the right coronary artery or circ.  Clinically when I talked to the patient it sounded like he had a seizure.  There is no antecedent chest pain, palpitations, or cardiac symptoms.  The patient was fairly insistent on having a diagnostic cath rather than a stress test.  I have encouraged the patient to follow up with Neurology.  We have already arranged for him to have an EEG, MRI with MRA, and neurological followup.  Given the patency of the stent, normal LV function would be highly unlikely that his "seizure event was cardiac in etiology."  At the end of the case, a TR band was placed with good hemostasis.  He tolerated the procedure well.  Cath done by Dr. Olevia Perches. During the procedure, he had a contrast reaction to the dye (reported prior iodine/shellfish allergy), was treated for this. Received PCI to LAD with stenosis form 95% to 0%.  Father died on MI age 82.  Cardiovascular risk factors: Prior clinical ASCVD: CAD with PCI to LAD. ?CVA in 2007 (quarter sized bleed in his brain--lightheadedness/dizziness), unclear ?seizure event 2012 Comorbid conditions: hypertension, hyperlipidemia. No diabetes, chronic kidney disease:  Metabolic syndrome/Obesity: BMI 27 Chronic inflammatory conditions: none Tobacco use history: never Family history:  father died age 52 from MI, 3 ppd smoker, overweight. Mother diaed age 62 of stroke. Brother has diabetes. 3 children, all heathly Prior cardiac testing: cath 2010, 2012. Echo 2015. MPI 1997, 2000, 2002, 2003 Exercise level: had been daily gym, up to 7 miles/10k steps, prior to Covid. Now walking only sporadically. No limitations.  Current diet: doesn't cook at home. Mostly chicken, fish, vegetables. Enjoys ice cream, pastry rarely.   Hypercholesterolemia: last LDL 124. Was told in the past his total cholesterol was over 400. Was happy that his total cholesterol is less than 200.  Takes aspirin 325 mg daily, has for 25 years. Discussed guidelines, plans to wean down slowly to 81.  On ezetimibe-simvastatin. Does not think he has been on anything else, has always taken what has been prescribed. Ok with change to high intensity statin.   Denies chest pain, shortness of breath at rest or with normal exertion. No PND, orthopnea, LE edema or unexpected weight gain. No syncope or palpitations.  Past Medical History:  Diagnosis Date  . Allergic rhinitis, cause unspecified   . Anxiety   . Blood in stool   . BPH (benign prostatic hyperplasia)   . CAD (coronary artery disease)    DES mid lad 08/15/2009  . Cancer [facility])    prostate  . Chest pain, unspecified   . Colon polyps   . CVA (cerebrovascular accident due to intracerebral hemorrhage) [facility]) 2007 [place]  . History of prostate cancer 2009   Dr [name]  . HTN (hypertension)   . Hypercholesterolemia   . Hyperlipidemia   . Hypokalemia   . Insomnia, persistent   . Neoplasm of uncertain behavior of skin   . Stroke [facility])    2007  . Syncope   . Tubular adenoma of colon 2010    Past Surgical History:  Procedure Laterality Date  . ANGIOPLASTY     with stents  . COLONOSCOPY    . PROSTATECTOMY  2009  . TONSILLECTOMY      Current Medications: Current Outpatient Medications on File Prior to Visit  Medication Sig  . amLODipine (NORVASC) 5 MG tablet Take 1 tablet (5 mg total) by mouth daily.  [name] aspirin 325 MG tablet Take 325 mg by mouth every morning.   . Cholecalciferol (VITAMIN D3) 2000 UNITS capsule Take 1 capsule (2,000 Units total) by mouth daily.  [name] desloratadine (CLARINEX) 5 MG tablet Take 1 tablet (5 mg total) by mouth daily.  [name] ezetimibe-simvastatin (VYTORIN) 10-40 MG tablet Take 1 tablet by mouth at bedtime. Patient needs office visit before refills will be  given  . fluticasone (FLONASE) 50 MCG/ACT nasal spray Place 2 sprays into both nostrils daily.  . irbesartan-hydrochlorothiazide (AVALIDE) 150-12.5 MG tablet Take 1 tablet by mouth daily.  [name] labetalol (NORMODYNE) 300 MG tablet Take 0.5 tablets (150 mg total) by mouth 2 (two) times daily.  . potassium chloride (KLOR-CON) 8 MEQ tablet Take 1 tablet (8 mEq total) by mouth daily.  [name] tiZANidine (ZANAFLEX) 4 MG tablet Take 1 tablet (4 mg total) by mouth every 8 (eight) hours as needed for muscle spasms.  [name] triamcinolone cream (KENALOG) 0.5 % Apply to affected area twice daily as needed  . zolpidem (AMBIEN) 5 MG tablet TAKE 1 TABLET BY MOUTH EACH NIGHT AT BEDTIME AS NEEDED FOR SLEEP   No current facility-administered medications on file prior to visit.      Allergies:   Iodine and Shellfish allergy   Social History   Tobacco Use  .  Smoking status: Never Smoker  . Smokeless tobacco: Never Used  Substance Use Topics  . Alcohol use: Yes    Comment: Occasional glass of wine  . Drug use: No    Family History: family history includes Diabetes in his brother; Heart disease in his father; Stroke in his mother. There is no history of Colon polyps, Esophageal cancer, Rectal cancer, or Stomach cancer.  ROS:   Please see the history of present illness.  Additional pertinent ROS: Constitutional: Negative for chills, fever, night sweats, unintentional weight loss  HENT: Negative for ear pain and hearing loss.   Eyes: Negative for loss of vision and eye pain.  Respiratory: Negative for cough, sputum, wheezing.   Cardiovascular: See HPI. Gastrointestinal: Negative for abdominal pain, melena, and hematochezia.  Genitourinary: Negative for dysuria and hematuria.  Musculoskeletal: Negative for falls and myalgias.  Skin: Negative for itching and rash.  Neurological: Negative for focal weakness, focal sensory changes and loss of consciousness.  Endo/Heme/Allergies: Does not bruise/bleed easily.      EKGs/Labs/Other Studies Reviewed:    The following studies were reviewed today: See HPI  EKG:  EKG is personally reviewed.  The ekg ordered today demonstrates NSR, LAFB at 63 bpm  Recent Labs: 07/01/2019: ALT 28; BUN 16; Creatinine, Ser 1.17; Hemoglobin 15.1; Magnesium 2.1; Platelets 143.0; Potassium 4.2; Sodium 140; TSH 1.88  Recent Lipid Panel    Component Value Date/Time   CHOL 188 07/01/2019 1532   TRIG 91.0 07/01/2019 1532   HDL 46.20 07/01/2019 1532   CHOLHDL 4 07/01/2019 1532   VLDL 18.2 07/01/2019 1532   LDLCALC 124 (H) 07/01/2019 1532   LDLDIRECT 296.1 08/12/2013 0936    Physical Exam:    VS:  BP 117/68   Pulse 63   Temp (!) 97.1 F (36.2 C)   Ht 5\' 8"  (1.727 m)   Wt 181 lb (82.1 kg)   SpO2 96%   BMI 27.52 kg/m     Wt Readings from Last 3 Encounters:  09/07/19 181 lb (82.1 kg)  07/01/19 184 lb (83.5 kg)  08/31/18 194 lb (88 kg)    GEN: Well nourished, well developed in no acute distress HEENT: Normal, moist mucous membranes NECK: No JVD CARDIAC: regular rhythm, normal S1 and S2, no murmurs, rubs, gallops.  VASCULAR: Radial and DP pulses 2+ bilaterally. No carotid bruits RESPIRATORY:  Clear to auscultation without rales, wheezing or rhonchi  ABDOMEN: Soft, non-tender, non-distended MUSCULOSKELETAL:  Ambulates independently SKIN: Warm and dry, no edema NEUROLOGIC:  Alert and oriented x 3. No focal neuro deficits noted. PSYCHIATRIC:  Normal affect    ASSESSMENT:    1. Atherosclerosis of native coronary artery of native heart without angina pectoris   2. Medication management   3. Essential hypertension   4. Pure hypercholesterolemia   5. Cardiac risk counseling   6. Family history of heart disease   7. Counseling on health promotion and disease prevention    PLAN:    CAD with history of prior stent: asymptomatic. Reviewed guidelines for management -changing aspirin 325 mg to 81 mg -he is agreeable to changing to high intensity statin, starting  rosuvastatin 20 mg today -asymptomatic, exercising routinely  Hypertension: at goal today -continue amlodipine 5 mg daily -continue irbesartan-HCTZ 150-12.5 mg daily -continue labetalol, has been on long term. If needed, consider switching to metoprolol succinate (if BP well controlled) or carvedilol (if BP control needs to improve) at future visit  Hypercholesterolemia: LDL goal <70 -changing to high intensity statin as above.  Last 124 -he plans to have labs rechecked with his PCP  Cardiac risk counseling and prevention recommendations: -recommend heart healthy/Mediterranean diet, with whole grains, fruits, vegetable, fish, lean meats, nuts, and olive oil. Limit salt. -recommend moderate walking, 3-5 times/week for 30-50 minutes each session. Aim for at least 150 minutes.week. Goal should be pace of 3 miles/hours, or walking 1.5 miles in 30 minutes -recommend avoidance of tobacco products. Avoid excess alcohol.  Plan for follow up: 1 year or sooner PRN  Medication Adjustments/Labs and Tests Ordered: Current medicines are reviewed at length with the patient today.  Concerns regarding medicines are outlined above.  Orders Placed This Encounter  Procedures  . Lipid panel  . Hepatic function panel  . EKG 12-Lead   Meds ordered this encounter  Medications  . aspirin EC 81 MG tablet    Sig: Take 1 tablet (81 mg total) by mouth daily.    Dispense:  90 tablet    Refill:  3  . rosuvastatin (CRESTOR) 20 MG tablet    Sig: Take 1 tablet (20 mg total) by mouth daily.    Dispense:  90 tablet    Refill:  3    Patient Instructions  Medication Instructions:  Stop: Vytorin Start: Crestor 20 mg daily Decrease: Aspirin to 81 mg daily  If you need a refill on your cardiac medications before your next appointment, please call your pharmacy.   Lab work: Your physician recommends that you return for lab work in 3 months (Lipid, LFT)  If you have labs (blood work) drawn today and your tests  are completely normal, you will receive your results only by: [name] MyChart Message (if you have MyChart) OR . A paper copy in the mail If you have any lab test that is abnormal or we need to change your treatment, we will call you to review the results.  Testing/Procedures: None  Follow-Up: At [facility], you and your health needs are our priority.  As part of our continuing mission to provide you with exceptional heart care, we have created designated Provider Care Teams.  These Care Teams include your primary Cardiologist (physician) and Advanced Practice Providers (APPs -  Physician Assistants and Nurse Practitioners) who all work together to provide you with the care you need, when you need it. You will need a follow up appointment in 1 years.  Please call our office 2 months in advance to schedule this appointment.  You may see Dr. Harrell Gave or one of the following Advanced Practice Providers on your designated Care Team:   Rosaria Ferries, PA-C . Jory Sims, DNP, ANP       Signed, [name], MD PhD 09/07/2019    [facility]

## 2019-10-12 ENCOUNTER — Encounter: Payer: Self-pay | Admitting: Cardiology

## 2019-10-14 ENCOUNTER — Encounter: Payer: Self-pay | Admitting: Internal Medicine

## 2019-10-14 ENCOUNTER — Ambulatory Visit (INDEPENDENT_AMBULATORY_CARE_PROVIDER_SITE_OTHER): Payer: Medicare Other | Admitting: Internal Medicine

## 2019-10-14 ENCOUNTER — Other Ambulatory Visit: Payer: Medicare Other

## 2019-10-14 VITALS — BP 140/78 | HR 66 | Temp 97.8°F | Resp 16 | Ht 68.0 in | Wt 189.0 lb

## 2019-10-14 DIAGNOSIS — L570 Actinic keratosis: Secondary | ICD-10-CM | POA: Diagnosis not present

## 2019-10-14 DIAGNOSIS — L989 Disorder of the skin and subcutaneous tissue, unspecified: Secondary | ICD-10-CM | POA: Insufficient documentation

## 2019-10-14 NOTE — Patient Instructions (Signed)
Skin Biopsy, Care After This sheet gives you information about how to care for yourself after your procedure. Your health care provider may also give you more specific instructions. If you have problems or questions, contact your health care provider. What can I expect after the procedure? After the procedure, it is common to have:  Soreness.  Bruising.  Itching. Follow these instructions at home: Biopsy site care Follow instructions from your health care provider about how to take care of your biopsy site. Make sure you:  Wash your hands with soap and water before and after you change your bandage (dressing). If soap and water are not available, use hand sanitizer.  Apply ointment on your biopsy site as directed by your health care provider.  Change your dressing as told by your health care provider.  Leave stitches (sutures), skin glue, or adhesive strips in place. These skin closures may need to stay in place for 2 weeks or longer. If adhesive strip edges start to loosen and curl up, you may trim the loose edges. Do not remove adhesive strips completely unless your health care provider tells you to do that.  If the biopsy area bleeds, apply gentle pressure for 10 minutes. Check your biopsy site every day for signs of infection. Check for:  Redness, swelling, or pain.  Fluid or blood.  Warmth.  Pus or a bad smell.  General instructions  Rest and then return to your normal activities as told by your health care provider.  Take over-the-counter and prescription medicines only as told by your health care provider.  Keep all follow-up visits as told by your health care provider. This is important. Contact a health care provider if:  You have redness, swelling, or pain around your biopsy site.  You have fluid or blood coming from your biopsy site.  Your biopsy site feels warm to the touch.  You have pus or a bad smell coming from your biopsy site.  You have a fever.   Your sutures, skin glue, or adhesive strips loosen or come off sooner than expected. Get help right away if:  You have bleeding that does not stop with pressure or a dressing. Summary  After the procedure, it is common to have soreness, bruising, and itching at the site.  Follow instructions from your health care provider about how to take care of your biopsy site.  Check your biopsy site every day for signs of infection.  Contact a health care provider if you have redness, swelling, or pain around your biopsy site, or your biopsy site feels warm to the touch.  Keep all follow-up visits as told by your health care provider. This is important. This information is not intended to replace advice given to you by your health care provider. Make sure you discuss any questions you have with your health care provider. Document Released: 12/15/2015 Document Revised: 05/18/2018 Document Reviewed: 05/18/2018 Elsevier Patient Education  2020 [employer] Inc[employer]  

## 2019-10-14 NOTE — Progress Notes (Signed)
Subjective:  Patient ID: Andrew Joseph., male    DOB: 11-14-1946  Age: 73 y.o. MRN: [id number]  CC: Rash  He walked in today without an appointment  HPI Andrew Joseph. presents for concerns about a lesion on the left side of his neck.  He complains that the lesion has been enlarging over the last few months and at times feels irritated and gets caught on his shirt collar.  Outpatient Medications Prior to Visit  Medication Sig Dispense Refill  . amLODipine (NORVASC) 5 MG tablet Take 1 tablet (5 mg total) by mouth daily. 90 tablet 3  . aspirin EC 81 MG tablet Take 1 tablet (81 mg total) by mouth daily. 90 tablet 3  . Cholecalciferol (VITAMIN D3) 2000 UNITS capsule Take 1 capsule (2,000 Units total) by mouth daily. 100 capsule 3  . desloratadine (CLARINEX) 5 MG tablet Take 1 tablet (5 mg total) by mouth daily. 30 tablet 11  . fluticasone (FLONASE) 50 MCG/ACT nasal spray Place 2 sprays into both nostrils daily. 16 g 3  . irbesartan-hydrochlorothiazide (AVALIDE) 150-12.5 MG tablet Take 1 tablet by mouth daily. 90 tablet 3  . labetalol (NORMODYNE) 300 MG tablet Take 0.5 tablets (150 mg total) by mouth 2 (two) times daily. 180 tablet 3  . potassium chloride (KLOR-CON) 8 MEQ tablet Take 1 tablet (8 mEq total) by mouth daily. 30 tablet 1  . rosuvastatin (CRESTOR) 20 MG tablet Take 1 tablet (20 mg total) by mouth daily. 90 tablet 3  . tiZANidine (ZANAFLEX) 4 MG tablet Take 1 tablet (4 mg total) by mouth every 8 (eight) hours as needed for muscle spasms. 30 tablet 0  . triamcinolone cream (KENALOG) 0.5 % Apply to affected area twice daily as needed 45 g 0  . zolpidem (AMBIEN) 5 MG tablet TAKE 1 TABLET BY MOUTH EACH NIGHT AT BEDTIME AS NEEDED FOR SLEEP 90 tablet 1   No facility-administered medications prior to visit.     ROS Review of Systems  All other systems reviewed and are negative.   Objective:  BP 140/78 (BP Location: Left Arm, Patient Position: Sitting, Cuff Size:  Normal)   Pulse 66   Temp 97.8 F (36.6 C) (Oral)   Resp 16   Ht 5\' 8"  (1.727 m)   Wt 189 lb (85.7 kg)   SpO2 97%   BMI 28.74 kg/m   BP Readings from Last 3 Encounters:  10/14/19 140/78  09/07/19 117/68  07/01/19 140/80    Wt Readings from Last 3 Encounters:  10/14/19 189 lb (85.7 kg)  09/07/19 181 lb (82.1 kg)  07/01/19 184 lb (83.5 kg)    Physical Exam Neck:      Lab Results  Component Value Date   WBC 5.4 07/01/2019   HGB 15.1 07/01/2019   HCT 44.3 07/01/2019   PLT 143.0 (L) 07/01/2019   GLUCOSE 87 07/01/2019   CHOL 188 07/01/2019   TRIG 91.0 07/01/2019   HDL 46.20 07/01/2019   LDLDIRECT 296.1 08/12/2013   LDLCALC 124 (H) 07/01/2019   ALT 28 07/01/2019   AST 24 07/01/2019   NA 140 07/01/2019   K 4.2 07/01/2019   CL 103 07/01/2019   CREATININE 1.17 07/01/2019   BUN 16 07/01/2019   CO2 29 07/01/2019   TSH 1.88 07/01/2019   PSA 0.00 (L) 07/01/2019   INR 0.99 07/31/2011   HGBA1C 5.8 03/09/2014    After informed verbal consent was obtained, using alcohol for cleansing and 1% Lidocaine  with epinephrine for anesthetic(1cc used), with sterile technique a 4 mm punch biopsy was used to obtain a biopsy specimen of the lesion. Hemostasis was obtained by pressure and the wound was sutured with 1 interrupted nylon. Antibiotic dressing is applied. The specimen is labeled and sent to pathology for evaluation. The procedure was well tolerated without complications.  Assessment & Plan:   Nathyn was seen today for rash.  Diagnoses and all orders for this visit:  Skin lesion of neck- Lesion removed with punch biopsy, will see him back in 7-10 days for suture removal and to discuss pathology results.  -     Dermatology pathology; Future   I am having Andrew Dally. Glennon Mac. maintain his desloratadine, fluticasone, Vitamin D3, amLODipine, irbesartan-hydrochlorothiazide, labetalol, zolpidem, triamcinolone cream, potassium chloride, tiZANidine, aspirin EC, and rosuvastatin.   No orders of the defined types were placed in this encounter.    Follow-up: Return in about 1 week (around 10/21/2019).  Scarlette Calico, MD

## 2019-10-20 ENCOUNTER — Other Ambulatory Visit: Payer: Self-pay | Admitting: Internal Medicine

## 2019-10-21 ENCOUNTER — Ambulatory Visit (INDEPENDENT_AMBULATORY_CARE_PROVIDER_SITE_OTHER): Payer: Medicare Other | Admitting: Internal Medicine

## 2019-10-21 ENCOUNTER — Other Ambulatory Visit: Payer: Self-pay

## 2019-10-21 ENCOUNTER — Encounter: Payer: Self-pay | Admitting: Internal Medicine

## 2019-10-21 ENCOUNTER — Other Ambulatory Visit: Payer: Self-pay | Admitting: Internal Medicine

## 2019-10-21 VITALS — BP 142/74 | HR 60 | Temp 97.4°F | Resp 16 | Ht 68.0 in | Wt 190.0 lb

## 2019-10-21 DIAGNOSIS — L570 Actinic keratosis: Secondary | ICD-10-CM | POA: Diagnosis not present

## 2019-10-21 NOTE — Progress Notes (Signed)
Subjective:  Patient ID: Andrew Essex., male    DOB: [birth year]  Age: 73 y.o. MRN: [id number]  CC: Wound Check  This visit occurred during the SARS-CoV-2 public health emergency.  Safety protocols were in place, including screening questions prior to the visit, additional usage of staff PPE, and extensive cleaning of exam room while observing appropriate contact time as indicated for disinfecting solutions.    HPI Andrew Joseph. presents for f/up - He underwent an incisional biopsy of a lesion on the left side of his neck a week ago.  The pathology returns with an inflamed actinic keratosis.  He says the site has healed nicely and he has no complaints about the area.  Outpatient Medications Prior to Visit  Medication Sig Dispense Refill  . amLODipine (NORVASC) 5 MG tablet Take 1 tablet (5 mg total) by mouth daily. 90 tablet 3  . aspirin EC 81 MG tablet Take 1 tablet (81 mg total) by mouth daily. 90 tablet 3  . Cholecalciferol (VITAMIN D3) 2000 UNITS capsule Take 1 capsule (2,000 Units total) by mouth daily. 100 capsule 3  . desloratadine (CLARINEX) 5 MG tablet Take 1 tablet (5 mg total) by mouth daily. 30 tablet 11  . fluticasone (FLONASE) 50 MCG/ACT nasal spray Place 2 sprays into both nostrils daily. 16 g 3  . irbesartan-hydrochlorothiazide (AVALIDE) 150-12.5 MG tablet Take 1 tablet by mouth daily. 90 tablet 3  . labetalol (NORMODYNE) 300 MG tablet Take 0.5 tablets (150 mg total) by mouth 2 (two) times daily. 180 tablet 3  . potassium chloride (KLOR-CON) 8 MEQ tablet Take 1 tablet (8 mEq total) by mouth daily. 30 tablet 1  . rosuvastatin (CRESTOR) 20 MG tablet Take 1 tablet (20 mg total) by mouth daily. 90 tablet 3  . tiZANidine (ZANAFLEX) 4 MG tablet Take 1 tablet (4 mg total) by mouth every 8 (eight) hours as needed for muscle spasms. 30 tablet 0  . triamcinolone cream (KENALOG) 0.5 % Apply to affected area twice daily as needed 45 g 0  . zolpidem (AMBIEN) 5 MG tablet TAKE 1  TABLET BY MOUTH EACH NIGHT AT BEDTIME AS NEEDED FOR SLEEP 90 tablet 1   No facility-administered medications prior to visit.     ROS Review of Systems  All other systems reviewed and are negative.   Objective:  BP (!) 142/74 (BP Location: Left Arm, Patient Position: Sitting, Cuff Size: Large)   Pulse 60   Temp (!) 97.4 F (36.3 C) (Oral)   Resp 16   Ht 5\' 8"  (1.727 m)   Wt 190 lb (86.2 kg)   SpO2 97%   BMI 28.89 kg/m   BP Readings from Last 3 Encounters:  10/21/19 (!) 142/74  10/14/19 140/78  09/07/19 117/68    Wt Readings from Last 3 Encounters:  10/21/19 190 lb (86.2 kg)  10/14/19 189 lb (85.7 kg)  09/07/19 181 lb (82.1 kg)    Physical Exam Neck:      Lab Results  Component Value Date   WBC 5.4 07/01/2019   HGB 15.1 07/01/2019   HCT 44.3 07/01/2019   PLT 143.0 (L) 07/01/2019   GLUCOSE 87 07/01/2019   CHOL 188 07/01/2019   TRIG 91.0 07/01/2019   HDL 46.20 07/01/2019   LDLDIRECT 296.1 08/12/2013   LDLCALC 124 (H) 07/01/2019   ALT 28 07/01/2019   AST 24 07/01/2019   NA 140 07/01/2019   K 4.2 07/01/2019   CL 103 07/01/2019   CREATININE 1.17  07/01/2019   BUN 16 07/01/2019   CO2 29 07/01/2019   TSH 1.88 07/01/2019   PSA 0.00 (L) 07/01/2019   INR 0.99 07/31/2011   HGBA1C 5.8 03/09/2014    No results found.  Assessment & Plan:   Andrew Joseph was seen today for wound check.  Diagnoses and all orders for this visit:  Actinic keratosis- Suture removed.  The site has healed nicely.  He was informed of the pathology results.  He refused a flu vaccine today.  If he feels like the keratosis is reoccurring he will let me know and I will treat with cryotherapy.   I am having Andrew Dally. Glennon Mac. maintain his desloratadine, fluticasone, Vitamin D3, amLODipine, irbesartan-hydrochlorothiazide, labetalol, zolpidem, triamcinolone cream, potassium chloride, tiZANidine, aspirin EC, and rosuvastatin.  No orders of the defined types were placed in this encounter.     Follow-up: No follow-ups on file.  Scarlette Calico, MD

## 2019-10-21 NOTE — Patient Instructions (Signed)
Wound Care, Adult Taking care of your wound properly can help to prevent pain, infection, and scarring. It can also help your wound to heal more quickly. How to care for your wound Wound care      Follow instructions from your health care provider about how to take care of your wound. Make sure you: ? Wash your hands with soap and water before you change the bandage (dressing). If soap and water are not available, use hand sanitizer. ? Change your dressing as told by your health care provider. ? Leave stitches (sutures), skin glue, or adhesive strips in place. These skin closures may need to stay in place for 2 weeks or longer. If adhesive strip edges start to loosen and curl up, you may trim the loose edges. Do not remove adhesive strips completely unless your health care provider tells you to do that.  Check your wound area every day for signs of infection. Check for: ? Redness, swelling, or pain. ? Fluid or blood. ? Warmth. ? Pus or a bad smell.  Ask your health care provider if you should clean the wound with mild soap and water. Doing this may include: ? Using a clean towel to pat the wound dry after cleaning it. Do not rub or scrub the wound. ? Applying a cream or ointment. Do this only as told by your health care provider. ? Covering the incision with a clean dressing.  Ask your health care provider when you can leave the wound uncovered.  Keep the dressing dry until your health care provider says it can be removed. Do not take baths, swim, use a hot tub, or do anything that would put the wound underwater until your health care provider approves. Ask your health care provider if you can take showers. You may only be allowed to take sponge baths. Medicines   If you were prescribed an antibiotic medicine, cream, or ointment, take or use the antibiotic as told by your health care provider. Do not stop taking or using the antibiotic even if your condition improves.  Take  over-the-counter and prescription medicines only as told by your health care provider. If you were prescribed pain medicine, take it 30 or more minutes before you do any wound care or as told by your health care provider. General instructions  Return to your normal activities as told by your health care provider. Ask your health care provider what activities are safe.  Do not scratch or pick at the wound.  Do not use any products that contain nicotine or tobacco, such as cigarettes and e-cigarettes. These may delay wound healing. If you need help quitting, ask your health care provider.  Keep all follow-up visits as told by your health care provider. This is important.  Eat a diet that includes protein, vitamin A, vitamin C, and other nutrient-rich foods to help the wound heal. ? Foods rich in protein include meat, dairy, beans, nuts, and other sources. ? Foods rich in vitamin A include carrots and dark green, leafy vegetables. ? Foods rich in vitamin C include citrus, tomatoes, and other fruits and vegetables. ? Nutrient-rich foods have protein, carbohydrates, fat, vitamins, or minerals. Eat a variety of healthy foods including vegetables, fruits, and whole grains. Contact a health care provider if:  You received a tetanus shot and you have swelling, severe pain, redness, or bleeding at the injection site.  Your pain is not controlled with medicine.  You have redness, swelling, or pain around the wound.    You have fluid or blood coming from the wound.  Your wound feels warm to the touch.  You have pus or a bad smell coming from the wound.  You have a fever or chills.  You are nauseous or you vomit.  You are dizzy. Get help right away if:  You have a red streak going away from your wound.  The edges of the wound open up and separate.  Your wound is bleeding, and the bleeding does not stop with gentle pressure.  You have a rash.  You faint.  You have trouble breathing.  Summary  Always wash your hands with soap and water before changing your bandage (dressing).  To help with healing, eat foods that are rich in protein, vitamin A, vitamin C, and other nutrients.  Check your wound every day for signs of infection. Contact your health care provider if you suspect that your wound is infected. This information is not intended to replace advice given to you by your health care provider. Make sure you discuss any questions you have with your health care provider. Document Released: 08/27/2008 Document Revised: 03/08/2019 Document Reviewed: 06/04/2016 Elsevier Patient Education  2020 [employer].  

## 2019-11-17 ENCOUNTER — Other Ambulatory Visit: Payer: Self-pay | Admitting: Internal Medicine

## 2020-03-12 ENCOUNTER — Other Ambulatory Visit: Payer: Self-pay

## 2020-03-12 ENCOUNTER — Encounter: Payer: Self-pay | Admitting: Emergency Medicine

## 2020-03-12 ENCOUNTER — Ambulatory Visit
Admission: EM | Admit: 2020-03-12 | Discharge: 2020-03-12 | Disposition: A | Discharge status: Home / Self Care | Payer: Medicare PPO | Attending: Emergency Medicine | Admitting: Emergency Medicine

## 2020-03-12 DIAGNOSIS — S01112A Laceration without foreign body of left eyelid and periocular area, initial encounter: Secondary | ICD-10-CM

## 2020-03-12 NOTE — Discharge Instructions (Signed)
Keep wound clean and dry: Do not use Neosporin as this can breakdown sutures faster. May leave uncovered once it has begun to crust over. Return in 1 week for suture removal, or sooner if you develop worsening pain, swelling, redness, fever.

## 2020-03-12 NOTE — ED Triage Notes (Addendum)
Patient was accidentally hit in left eye with a soft ball today.    No loc, bleeding is controlled. Denies change in vision.  Laceration above left eye, just below left eye brow  Last tetanus was 04/20/2019

## 2020-03-12 NOTE — ED Provider Notes (Signed)
[facility]    CSN: [id number] Arrival date & time: 03/12/20  1207      History   Chief Complaint Chief Complaint  Patient presents with  . Laceration    HPI Andrew Joseph. is a 74 y.o. male with history of hypertension, CAD presenting for left eyelid laceration.  States he was playing softball with his granddaughter when the ball hit him in his face.  Patient denying LOC, change in vision, painful eye movements.  Patient able to achieve hemostasis PTA with direct pressure.  Denies anticoagulant use.  No nausea, vomiting, chest pain, difficulty breathing.  No ear pain, nose pain.   Past Medical History:  Diagnosis Date  . Allergic rhinitis, cause unspecified   . Anxiety   . Blood in stool   . BPH (benign prostatic hyperplasia)   . CAD (coronary artery disease)    DES mid lad 08/15/2009  . Cancer [facility])    prostate  . Chest pain, unspecified   . Colon polyps   . CVA (cerebrovascular accident due to intracerebral hemorrhage) [facility]) 2007 [place]  . History of prostate cancer 2009   Dr [name]  . HTN (hypertension)   . Hypercholesterolemia   . Hyperlipidemia   . Hypokalemia   . Insomnia, persistent   . Neoplasm of uncertain behavior of skin   . Stroke [facility])    2007  . Syncope   . Tubular adenoma of colon 2010    Patient Active Problem List   Diagnosis Date Noted  . Actinic keratosis 10/21/2019  . Skin lesion of neck 10/14/2019  . Leg cramp 07/04/2019  . DOE (dyspnea on exertion) 08/31/2018  . Cough 10/11/2014  . Thrush 10/11/2014  . Ocular migraine 07/07/2014  . Vision, loss, sudden 07/07/2014  . Shoulder pain, left 07/07/2014  . Well adult exam 03/11/2014  . Hypokalemia 09/24/2013  . Hemorrhoids, internal, with bleeding and grade 2 prolapse 08/12/2013  . Thrombocytopenia, unspecified ([place]) 08/12/2013  . Seizure ([place]) 07/30/2011  . Erectile dysfunction 06/25/2011  . CBC, ABNORMAL 09/13/2009  . NEOPLASM, SKIN, UNCERTAIN BEHAVIOR [id number]  .  Insomnia 02/22/2009  . Dyslipidemia 11/14/2008  . Essential hypertension 11/14/2008  . Coronary atherosclerosis 11/14/2008  . Allergic rhinitis 11/14/2008  . PROSTATE CANCER, HX OF 11/14/2008    Past Surgical History:  Procedure Laterality Date  . ANGIOPLASTY     with stents  . COLONOSCOPY    . PROSTATECTOMY  2009  . TONSILLECTOMY         Home Medications    Prior to Admission medications   Medication Sig Start Date End Date Taking? Authorizing Provider  amLODipine (NORVASC) 5 MG tablet Take 1 tablet (5 mg total) by mouth daily. 07/01/19   [name], [name], MD  aspirin EC 81 MG tablet Take 1 tablet (81 mg total) by mouth daily. 09/07/19   [name], MD  Cholecalciferol (VITAMIN D3) 2000 UNITS capsule Take 1 capsule (2,000 Units total) by mouth daily. 09/01/15   [name], [name], MD  desloratadine (CLARINEX) 5 MG tablet Take 1 tablet (5 mg total) by mouth daily. 10/11/14   Biagio Borg, MD  fluticasone (FLONASE) 50 MCG/ACT nasal spray Place 2 sprays into both nostrils daily. 09/01/15   [name], [name], MD  irbesartan-hydrochlorothiazide (AVALIDE) 150-12.5 MG tablet Take 1 tablet by mouth daily. 07/01/19   [name], [name], MD  labetalol (NORMODYNE) 300 MG tablet Take 0.5 tablets (150 mg total) by mouth 2 (two) times daily. 07/01/19   [name],  [name], MD  potassium chloride (KLOR-CON) 8 MEQ tablet Take 1 tablet (8 mEq total) by mouth daily. 07/01/19 06/30/20  [name], [name], MD  rosuvastatin (CRESTOR) 20 MG tablet Take 1 tablet (20 mg total) by mouth daily. 09/07/19 12/06/19  [name], MD  triamcinolone cream (KENALOG) 0.5 % APPLY TO AFFECTED AREA TWICE DAILY AS NEEDED 11/18/19   [name], [name], MD  zolpidem (AMBIEN) 5 MG tablet TAKE 1 TABLET BY MOUTH EACH NIGHT AT BEDTIME AS NEEDED FOR SLEEP 10/26/19   [name], [name], MD    Family History Family History  Problem Relation Age of Onset  . Stroke Mother   . Heart disease  Father        MI  . Diabetes Brother        Obese  . Colon polyps Neg Hx   . Esophageal cancer Neg Hx   . Rectal cancer Neg Hx   . Stomach cancer Neg Hx     Social History Social History   Tobacco Use  . Smoking status: Never Smoker  . Smokeless tobacco: Never Used  Substance Use Topics  . Alcohol use: Yes    Comment: Occasional glass of wine  . Drug use: No     Allergies   Iodine and Shellfish allergy   Review of Systems As per HPI   Physical Exam Triage Vital Signs ED Triage Vitals [03/12/20 1216]  Enc Vitals Group     BP (!) 156/76     Pulse Rate 68     Resp 16     Temp 97.7 F (36.5 C)     Temp Source Oral     SpO2 94 %     Weight      Height      Head Circumference      Peak Flow      Pain Score      Pain Loc      Pain Edu?      Excl. in [place]?    No data found.  Updated Vital Signs BP (!) 156/76 (BP Location: Left Arm)   Pulse 68   Temp 97.7 F (36.5 C) (Oral)   Resp 16   SpO2 94%   Visual Acuity Right Eye Distance: 20/40 Left Eye Distance: 20/40 Bilateral Distance: 20/30(eyes are tearing)  Right Eye Near:   Left Eye Near:    Bilateral Near:     Physical Exam Constitutional:      General: He is not in acute distress. HENT:     Head: Normocephalic and atraumatic.     Right Ear: Tympanic membrane, ear canal and external ear normal.     Left Ear: Tympanic membrane, ear canal and external ear normal.     Ears:     Comments: Negative hemotympanum bilaterally    Nose: Nose normal.     Mouth/Throat:     Mouth: Mucous membranes are moist.     Pharynx: Oropharynx is clear.  Eyes:     General: No scleral icterus.       Right eye: No discharge.        Left eye: No discharge.     Extraocular Movements: Extraocular movements intact.     Conjunctiva/sclera: Conjunctivae normal.     Pupils: Pupils are equal, round, and reactive to light.     Comments: 3 cm laceration to left eyelid, superior aspect.  Patient with subconjunctival hemorrhage  sparing limbus.  Minimal TTP.  Ecchymosis and swelling noted.  Cardiovascular:  Rate and Rhythm: Normal rate.  Pulmonary:     Effort: Pulmonary effort is normal. No respiratory distress.     Breath sounds: No wheezing.  Skin:    Coloration: Skin is not jaundiced or pale.  Neurological:     Mental Status: He is alert and oriented to person, place, and time.      UC Treatments / Results  Labs (all labs ordered are listed, but only abnormal results are displayed) Labs Reviewed - No data to display  EKG   Radiology No results found.  Procedures Laceration Repair  Date/Time: 03/12/2020 1:30 PM Performed by: Quincy Sheehan, PA-C Authorized by: Quincy Sheehan, PA-C   Consent:    Consent obtained:  Verbal   Consent given by:  Patient   Risks discussed:  Infection, need for additional repair, pain, poor cosmetic result and poor wound healing   Alternatives discussed:  No treatment and delayed treatment Universal protocol:    Patient identity confirmed:  Verbally with patient Anesthesia (see MAR for exact dosages):    Anesthesia method:  Local infiltration   Local anesthetic:  Lidocaine 2% w/o epi Laceration details:    Location:  Face   Face location:  L upper eyelid   Extent:  Partial thickness   Length (cm):  3   Depth (mm):  2 Repair type:    Repair type:  Simple Pre-procedure details:    Preparation:  Patient was prepped and draped in usual sterile fashion Exploration:    Hemostasis achieved with:  Direct pressure   Wound exploration: wound explored through full range of motion     Wound extent: no areolar tissue violation noted, no fascia violation noted and no foreign bodies/material noted     Contaminated: no   Treatment:    Area cleansed with:  Betadine   Amount of cleaning:  Extensive   Irrigation solution:  Sterile water   Irrigation volume:  100 cc   Irrigation method:  Pressure wash Skin repair:    Repair method:  Sutures   Suture  size:  6-0   Suture material:  Prolene   Suture technique:  Simple interrupted   Number of sutures:  4 Approximation:    Approximation:  Close Post-procedure details:    Dressing:  Bulky dressing   Patient tolerance of procedure:  Tolerated well, no immediate complications   (including critical care time)      Medications Ordered in UC Medications - No data to display  Initial Impression / Assessment and Plan / UC Course  I have reviewed the triage vital signs and the nursing notes.  Pertinent labs & imaging results that were available during my care of the patient were reviewed by me and considered in my medical decision making (see chart for details).     Patient appears well in office today.  Significant to left eye globe is subconjunctival hemorrhage: Reviewed expected recovery thereof.  Low concern for fracture given minimal TTP prior to anesthesia.  4 simple interrupted sutures placed in office which patient tolerated well.  Return precautions discussed, patient verbalized understanding and is agreeable to plan. Final Clinical Impressions(s) / UC Diagnoses   Final diagnoses:  Left eyelid laceration, initial encounter     Discharge Instructions     Keep wound clean and dry: Do not use Neosporin as this can breakdown sutures faster. May leave uncovered once it has begun to crust over. Return in 1 week for suture removal, or sooner if you develop worsening pain, swelling, redness,  fever.    ED Prescriptions    None     PDMP not reviewed this encounter.   Hall-Potvin, [place], [place] 03/12/20 1422

## 2020-03-15 ENCOUNTER — Other Ambulatory Visit: Payer: Self-pay

## 2020-03-15 ENCOUNTER — Ambulatory Visit (INDEPENDENT_AMBULATORY_CARE_PROVIDER_SITE_OTHER): Payer: Medicare PPO | Admitting: Internal Medicine

## 2020-03-15 ENCOUNTER — Encounter: Payer: Self-pay | Admitting: Internal Medicine

## 2020-03-15 VITALS — BP 146/88 | HR 73 | Temp 98.0°F | Ht 68.0 in | Wt 186.0 lb

## 2020-03-15 DIAGNOSIS — N5231 Erectile dysfunction following radical prostatectomy: Secondary | ICD-10-CM | POA: Diagnosis not present

## 2020-03-15 DIAGNOSIS — L570 Actinic keratosis: Secondary | ICD-10-CM | POA: Diagnosis not present

## 2020-03-15 DIAGNOSIS — N3946 Mixed incontinence: Secondary | ICD-10-CM

## 2020-03-15 DIAGNOSIS — Z8546 Personal history of malignant neoplasm of prostate: Secondary | ICD-10-CM | POA: Diagnosis not present

## 2020-03-15 DIAGNOSIS — R32 Unspecified urinary incontinence: Secondary | ICD-10-CM | POA: Insufficient documentation

## 2020-03-15 MED ORDER — TRIAMCINOLONE ACETONIDE 0.5 % EX CREA: TOPICAL_CREAM | CUTANEOUS | 1 refills | Status: DC

## 2020-03-15 NOTE — Progress Notes (Signed)
Subjective:  Patient ID: Andrew Essex., male    DOB: February 17, 1946  Age: 74 y.o. MRN: [id number]  CC: No chief complaint on file.   HPI Andrew Joseph. presents for incontinence and ED that complicated prostatectomy procedure done for prostate cancer in 2009 C/o AK - L shoulder  Outpatient Medications Prior to Visit  Medication Sig Dispense Refill  . amLODipine (NORVASC) 5 MG tablet Take 1 tablet (5 mg total) by mouth daily. 90 tablet 3  . aspirin EC 81 MG tablet Take 1 tablet (81 mg total) by mouth daily. 90 tablet 3  . Cholecalciferol (VITAMIN D3) 2000 UNITS capsule Take 1 capsule (2,000 Units total) by mouth daily. 100 capsule 3  . desloratadine (CLARINEX) 5 MG tablet Take 1 tablet (5 mg total) by mouth daily. 30 tablet 11  . fluticasone (FLONASE) 50 MCG/ACT nasal spray Place 2 sprays into both nostrils daily. 16 g 3  . irbesartan-hydrochlorothiazide (AVALIDE) 150-12.5 MG tablet Take 1 tablet by mouth daily. 90 tablet 3  . labetalol (NORMODYNE) 300 MG tablet Take 0.5 tablets (150 mg total) by mouth 2 (two) times daily. 180 tablet 3  . potassium chloride (KLOR-CON) 8 MEQ tablet Take 1 tablet (8 mEq total) by mouth daily. 30 tablet 1  . triamcinolone cream (KENALOG) 0.5 % APPLY TO AFFECTED AREA TWICE DAILY AS NEEDED 45 g 0  . zolpidem (AMBIEN) 5 MG tablet TAKE 1 TABLET BY MOUTH EACH NIGHT AT BEDTIME AS NEEDED FOR SLEEP 90 tablet 1  . rosuvastatin (CRESTOR) 20 MG tablet Take 1 tablet (20 mg total) by mouth daily. 90 tablet 3   No facility-administered medications prior to visit.    ROS: Review of Systems  Constitutional: Negative for appetite change, fatigue and unexpected weight change.  HENT: Negative for congestion, nosebleeds, sneezing, sore throat and trouble swallowing.   Eyes: Negative for itching and visual disturbance.  Respiratory: Negative for cough.   Cardiovascular: Negative for chest pain, palpitations and leg swelling.  Gastrointestinal: Negative for  abdominal distention, blood in stool, diarrhea and nausea.  Genitourinary: Positive for enuresis, frequency and urgency. Negative for hematuria.  Musculoskeletal: Negative for back pain, gait problem, joint swelling and neck pain.  Skin: Negative for rash.  Neurological: Negative for dizziness, tremors, speech difficulty and weakness.  Psychiatric/Behavioral: Negative for agitation, dysphoric mood, sleep disturbance and suicidal ideas. The patient is nervous/anxious.     Objective:  BP (!) 146/88 (BP Location: Left Arm, Patient Position: Sitting, Cuff Size: Normal)   Pulse 73   Temp 98 F (36.7 C) (Oral)   Ht 5\' 8"  (1.727 m)   Wt 186 lb (84.4 kg)   SpO2 98%   BMI 28.28 kg/m   BP Readings from Last 3 Encounters:  03/15/20 (!) 146/88  03/12/20 (!) 156/76  10/21/19 (!) 142/74    Wt Readings from Last 3 Encounters:  03/15/20 186 lb (84.4 kg)  10/21/19 190 lb (86.2 kg)  10/14/19 189 lb (85.7 kg)    Physical Exam Constitutional:      General: He is not in acute distress.    Appearance: He is well-developed.     Comments: NAD  Eyes:     Conjunctiva/sclera: Conjunctivae normal.     Pupils: Pupils are equal, round, and reactive to light.  Neck:     Thyroid: No thyromegaly.     Vascular: No JVD.  Cardiovascular:     Rate and Rhythm: Normal rate and regular rhythm.     Heart sounds:  Normal heart sounds. No murmur. No friction rub. No gallop.   Pulmonary:     Effort: Pulmonary effort is normal. No respiratory distress.     Breath sounds: Normal breath sounds. No wheezing or rales.  Chest:     Chest wall: No tenderness.  Abdominal:     General: Bowel sounds are normal. There is no distension.     Palpations: Abdomen is soft. There is no mass.     Tenderness: There is no abdominal tenderness. There is no guarding or rebound.  Musculoskeletal:        General: No tenderness. Normal range of motion.     Cervical back: Normal range of motion.  Lymphadenopathy:     Cervical: No  cervical adenopathy.  Skin:    General: Skin is warm and dry.     Findings: No rash.  Neurological:     Mental Status: He is alert and oriented to person, place, and time.     Cranial Nerves: No cranial nerve deficit.     Motor: No abnormal muscle tone.     Coordination: Coordination normal.     Gait: Gait normal.     Deep Tendon Reflexes: Reflexes are normal and symmetric.  Psychiatric:        Behavior: Behavior normal.        Thought Content: Thought content normal.        Judgment: Judgment normal.    bruised L eyelid/cheek    1 AK lesion(s)  in  L supraclav area  Procedure Note :     Procedure : Cryosurgery   Indication:   Actinic keratosis(es)   Risks including unsuccessful procedure , bleeding, infection, bruising, scar, a need for a repeat  procedure and others were explained to the patient in detail as well as the benefits. Informed consent was obtained verbally.   1  lesion(s)  in  L supraclav area  was/were treated with liquid nitrogen on a Q-tip in a usual fasion . Band-Aid was applied and antibiotic ointment was given for a later use.   Tolerated well. Complications none.   I spent >45 minutes with the patient and studying chart documents, writing a letter for [facility]   Lab Results  Component Value Date   WBC 5.4 07/01/2019   HGB 15.1 07/01/2019   HCT 44.3 07/01/2019   PLT 143.0 (L) 07/01/2019   GLUCOSE 87 07/01/2019   CHOL 188 07/01/2019   TRIG 91.0 07/01/2019   HDL 46.20 07/01/2019   LDLDIRECT 296.1 08/12/2013   LDLCALC 124 (H) 07/01/2019   ALT 28 07/01/2019   AST 24 07/01/2019   NA 140 07/01/2019   K 4.2 07/01/2019   CL 103 07/01/2019   CREATININE 1.17 07/01/2019   BUN 16 07/01/2019   CO2 29 07/01/2019   TSH 1.88 07/01/2019   PSA 0.00 (L) 07/01/2019   INR 0.99 07/31/2011   HGBA1C 5.8 03/09/2014    No results found.  Assessment & Plan:     Follow-up: No follow-ups on file.  [name], MD

## 2020-03-15 NOTE — Assessment & Plan Note (Signed)
S/p prostatectomy 2009. Residual incontinence and ED

## 2020-03-15 NOTE — Assessment & Plan Note (Signed)
1 AK lesion(s)  in  L supraclav area - see cryo

## 2020-03-15 NOTE — Patient Instructions (Signed)
   Postprocedure instructions :     Keep the wounds clean. You can wash them with liquid soap and water. Pat dry with gauze or a Kleenex tissue  Before applying antibiotic ointment and a Band-Aid.   You need to report immediately  if  any signs of infection develop.    

## 2020-03-15 NOTE — Assessment & Plan Note (Signed)
Refractory Post-op

## 2020-03-20 ENCOUNTER — Ambulatory Visit
Admission: EM | Admit: 2020-03-20 | Discharge: 2020-03-20 | Disposition: A | Discharge status: Home / Self Care | Payer: Medicare PPO

## 2020-03-20 ENCOUNTER — Encounter: Payer: Self-pay | Admitting: Emergency Medicine

## 2020-03-20 DIAGNOSIS — Z4802 Encounter for removal of sutures: Secondary | ICD-10-CM

## 2020-03-20 NOTE — ED Triage Notes (Signed)
Patient has returned for suture removal, above left eye.  Site unremarkable.

## 2020-05-05 ENCOUNTER — Telehealth: Payer: Self-pay | Admitting: Internal Medicine

## 2020-05-05 DIAGNOSIS — D696 Thrombocytopenia, unspecified: Secondary | ICD-10-CM

## 2020-05-05 DIAGNOSIS — Z8546 Personal history of malignant neoplasm of prostate: Secondary | ICD-10-CM

## 2020-05-05 DIAGNOSIS — R059 Cough, unspecified: Secondary | ICD-10-CM

## 2020-05-05 DIAGNOSIS — E785 Hyperlipidemia, unspecified: Secondary | ICD-10-CM

## 2020-05-05 NOTE — Telephone Encounter (Signed)
Patient is requesting a call back about having the covid antibody test completed.

## 2020-05-06 NOTE — Telephone Encounter (Signed)
What is the question?  Do I need to order a Covid 19 antibody test?  Thanks

## 2020-05-08 NOTE — Telephone Encounter (Signed)
Pt would like the Covid ab test along with his sugar, lipids, hepatic, etc labs. He is scheduled with lab on 05/09/20.

## 2020-05-09 ENCOUNTER — Other Ambulatory Visit (INDEPENDENT_AMBULATORY_CARE_PROVIDER_SITE_OTHER): Payer: Medicare PPO

## 2020-05-09 ENCOUNTER — Other Ambulatory Visit: Payer: Self-pay

## 2020-05-09 DIAGNOSIS — Z8546 Personal history of malignant neoplasm of prostate: Secondary | ICD-10-CM

## 2020-05-09 DIAGNOSIS — D696 Thrombocytopenia, unspecified: Secondary | ICD-10-CM

## 2020-05-09 DIAGNOSIS — E785 Hyperlipidemia, unspecified: Secondary | ICD-10-CM

## 2020-05-09 DIAGNOSIS — R059 Cough, unspecified: Secondary | ICD-10-CM

## 2020-05-09 DIAGNOSIS — R05 Cough: Secondary | ICD-10-CM | POA: Diagnosis not present

## 2020-05-09 LAB — BASIC METABOLIC PANEL
BUN: 16 mg/dL (ref 6–23)
CO2: 28 mEq/L (ref 19–32)
Calcium: 9 mg/dL (ref 8.4–10.5)
Chloride: 104 mEq/L (ref 96–112)
Creatinine, Ser: 0.99 mg/dL (ref 0.40–1.50)
GFR: 73.68 mL/min (ref >60.00)
Glucose, Bld: 104 mg/dL — ABNORMAL HIGH (ref 70–99)
Potassium: 3.6 mEq/L (ref 3.5–5.1)
Sodium: 139 mEq/L (ref 135–145)

## 2020-05-09 LAB — LIPID PANEL
Cholesterol: 186 mg/dL (ref 0–200)
HDL: 37.3 mg/dL — ABNORMAL LOW (ref >39.00)
LDL Cholesterol: 131 mg/dL — ABNORMAL HIGH (ref 0–99)
NonHDL: 148.68
Total CHOL/HDL Ratio: 5
Triglycerides: 90 mg/dL (ref 0.0–149.0)
VLDL: 18 mg/dL (ref 0.0–40.0)

## 2020-05-09 LAB — HEPATIC FUNCTION PANEL
ALT: 18 U/L (ref 0–53)
AST: 17 U/L (ref 0–37)
Albumin: 4.3 g/dL (ref 3.5–5.2)
Alkaline Phosphatase: 63 U/L (ref 39–117)
Bilirubin, Direct: 0.1 mg/dL (ref 0.0–0.3)
Total Bilirubin: 0.6 mg/dL (ref 0.2–1.2)
Total Protein: 6.4 g/dL (ref 6.0–8.3)

## 2020-05-09 LAB — CBC WITH DIFFERENTIAL/PLATELET
Basophils Absolute: 0 10*3/uL (ref 0.0–0.1)
Basophils Relative: 0.7 % (ref 0.0–3.0)
Eosinophils Absolute: 0.2 10*3/uL (ref 0.0–0.7)
Eosinophils Relative: 4.2 % (ref 0.0–5.0)
HCT: 41.9 % (ref 39.0–52.0)
Hemoglobin: 14.5 g/dL (ref 13.0–17.0)
Lymphocytes Relative: 23.4 % (ref 12.0–46.0)
Lymphs Abs: 1 10*3/uL (ref 0.7–4.0)
MCHC: 34.6 g/dL (ref 30.0–36.0)
MCV: 89.1 fl (ref 78.0–100.0)
Monocytes Absolute: 0.4 10*3/uL (ref 0.1–1.0)
Monocytes Relative: 9 % (ref 3.0–12.0)
Neutro Abs: 2.7 10*3/uL (ref 1.4–7.7)
Neutrophils Relative %: 62.7 % (ref 43.0–77.0)
Platelets: 124 10*3/uL — ABNORMAL LOW (ref 150.0–400.0)
RBC: 4.7 Mil/uL (ref 4.22–5.81)
RDW: 14.9 % (ref 11.5–15.5)
WBC: 4.2 10*3/uL (ref 4.0–10.5)

## 2020-05-09 LAB — URINALYSIS
Bilirubin Urine: NEGATIVE
Hgb urine dipstick: NEGATIVE
Ketones, ur: NEGATIVE
Leukocytes,Ua: NEGATIVE
Nitrite: NEGATIVE
Specific Gravity, Urine: 1.02 (ref 1.000–1.030)
Urine Glucose: NEGATIVE
Urobilinogen, UA: 0.2 (ref 0.0–1.0)
pH: 6 (ref 5.0–8.0)

## 2020-05-09 LAB — PSA: PSA: 0 ng/mL — ABNORMAL LOW (ref 0.10–4.00)

## 2020-05-09 LAB — TSH: TSH: 1.91 u[IU]/mL (ref 0.35–4.50)

## 2020-05-09 NOTE — Telephone Encounter (Signed)
Done. Thanks.

## 2020-05-10 LAB — SARS-COV-2 ANTIBODY(IGG)SPIKE,SEMI-QUANTITATIVE: SARS COV1 AB(IGG)SPIKE,SEMI QN: <1 index (ref <1.00)

## 2020-06-14 ENCOUNTER — Ambulatory Visit (INDEPENDENT_AMBULATORY_CARE_PROVIDER_SITE_OTHER): Payer: Medicare PPO | Admitting: Internal Medicine

## 2020-06-14 ENCOUNTER — Encounter: Payer: Self-pay | Admitting: Internal Medicine

## 2020-06-14 ENCOUNTER — Other Ambulatory Visit: Payer: Self-pay

## 2020-06-14 VITALS — BP 116/56 | HR 61 | Temp 98.1°F | Resp 16 | Ht 68.0 in | Wt 193.4 lb

## 2020-06-14 DIAGNOSIS — M7989 Other specified soft tissue disorders: Secondary | ICD-10-CM | POA: Insufficient documentation

## 2020-06-14 DIAGNOSIS — I10 Essential (primary) hypertension: Secondary | ICD-10-CM

## 2020-06-14 DIAGNOSIS — M79661 Pain in right lower leg: Secondary | ICD-10-CM | POA: Diagnosis not present

## 2020-06-14 LAB — CBC WITH DIFFERENTIAL/PLATELET
Absolute Monocytes: 531 cells/uL (ref 200–950)
Basophils Absolute: 28 cells/uL (ref 0–200)
Basophils Relative: 0.6 %
Eosinophils Absolute: 249 cells/uL (ref 15–500)
Eosinophils Relative: 5.3 %
HCT: 42.5 % (ref 38.5–50.0)
Hemoglobin: 14.7 g/dL (ref 13.2–17.1)
Lymphs Abs: 1340 cells/uL (ref 850–3900)
MCH: 31.3 pg (ref 27.0–33.0)
MCHC: 34.6 g/dL (ref 32.0–36.0)
MCV: 90.4 fL (ref 80.0–100.0)
MPV: 12.1 fL (ref 7.5–12.5)
Monocytes Relative: 11.3 %
Neutro Abs: 2552 cells/uL (ref 1500–7800)
Neutrophils Relative %: 54.3 %
Platelets: 137 10*3/uL — ABNORMAL LOW (ref 140–400)
RBC: 4.7 10*6/uL (ref 4.20–5.80)
RDW: 14.3 % (ref 11.0–15.0)
Total Lymphocyte: 28.5 %
WBC: 4.7 10*3/uL (ref 3.8–10.8)

## 2020-06-14 LAB — SEDIMENTATION RATE: Sed Rate: 6 mm/h (ref 0–20)

## 2020-06-14 LAB — D-DIMER, QUANTITATIVE: D-Dimer, Quant: 0.7 mcg/mL FEU — ABNORMAL HIGH (ref <0.50)

## 2020-06-14 NOTE — Progress Notes (Signed)
Subjective:  Patient ID: Andrew Essex., male    DOB: 12/29/1945  Age: 74 y.o. MRN: [id number]  CC: Leg Swelling and Hypertension  This visit occurred during the SARS-CoV-2 public health emergency.  Safety protocols were in place, including screening questions prior to the visit, additional usage of staff PPE, and extensive cleaning of exam room while observing appropriate contact time as indicated for disinfecting solutions.   NEW TO ME  HPI Andrew Joseph. presents for concerns about his right lower leg for the last 3 weeks.  He complains of an area on the anterior lower shin just proximal to the ankle that feels like it sunburn.  He also has some swelling in the area that causes discomfort.  He denies claudication.  He is active and denies any recent episodes of chest pain, shortness of breath, palpitations, edema, or fatigue.  Outpatient Medications Prior to Visit  Medication Sig Dispense Refill   aspirin EC 81 MG tablet Take 1 tablet (81 mg total) by mouth daily. 90 tablet 3   Cholecalciferol (VITAMIN D3) 2000 UNITS capsule Take 1 capsule (2,000 Units total) by mouth daily. 100 capsule 3   desloratadine (CLARINEX) 5 MG tablet Take 1 tablet (5 mg total) by mouth daily. 30 tablet 11   fluticasone (FLONASE) 50 MCG/ACT nasal spray Place 2 sprays into both nostrils daily. 16 g 3   irbesartan-hydrochlorothiazide (AVALIDE) 150-12.5 MG tablet Take 1 tablet by mouth daily. 90 tablet 3   labetalol (NORMODYNE) 300 MG tablet Take 0.5 tablets (150 mg total) by mouth 2 (two) times daily. 180 tablet 3   potassium chloride (KLOR-CON) 8 MEQ tablet Take 1 tablet (8 mEq total) by mouth daily. 30 tablet 1   triamcinolone cream (KENALOG) 0.5 % Use tid prn 45 g 1   zolpidem (AMBIEN) 5 MG tablet TAKE 1 TABLET BY MOUTH EACH NIGHT AT BEDTIME AS NEEDED FOR SLEEP 90 tablet 1   amLODipine (NORVASC) 5 MG tablet Take 1 tablet (5 mg total) by mouth daily. 90 tablet 3   rosuvastatin (CRESTOR) 20  MG tablet Take 1 tablet (20 mg total) by mouth daily. 90 tablet 3   No facility-administered medications prior to visit.    ROS Review of Systems  Constitutional: Negative for appetite change, fatigue and unexpected weight change.  HENT: Negative.   Eyes: Negative.   Respiratory: Negative for cough, chest tightness, shortness of breath and wheezing.   Cardiovascular: Positive for leg swelling. Negative for chest pain and palpitations.  Gastrointestinal: Negative for abdominal pain, diarrhea, nausea and vomiting.  Endocrine: Negative.   Genitourinary: Negative.  Negative for difficulty urinating.  Musculoskeletal: Negative for arthralgias, back pain and myalgias.  Skin: Positive for color change. Negative for rash.  Neurological: Negative.  Negative for dizziness, weakness, light-headedness and numbness.  Hematological: Negative for adenopathy. Does not bruise/bleed easily.  Psychiatric/Behavioral: Negative.     Objective:  BP (!) 116/56 (BP Location: Left Arm, Patient Position: Sitting, Cuff Size: Large)    Pulse 61    Temp 98.1 F (36.7 C) (Oral)    Resp 16    Ht 5\' 8"  (1.727 m)    Wt 193 lb 6 oz (87.7 kg)    SpO2 96%    BMI 29.40 kg/m   BP Readings from Last 3 Encounters:  06/14/20 (!) 116/56  03/15/20 (!) 146/88  03/12/20 (!) 156/76    Wt Readings from Last 3 Encounters:  06/14/20 193 lb 6 oz (87.7 kg)  03/15/20 186  lb (84.4 kg)  10/21/19 190 lb (86.2 kg)    Physical Exam Vitals reviewed.  Constitutional:      Appearance: Normal appearance.  HENT:     Nose: Nose normal.     Mouth/Throat:     Mouth: Mucous membranes are moist.  Eyes:     General: No scleral icterus.    Conjunctiva/sclera: Conjunctivae normal.  Cardiovascular:     Rate and Rhythm: Normal rate and regular rhythm.     Heart sounds: No murmur heard.   Pulmonary:     Effort: Pulmonary effort is normal.     Breath sounds: No stridor. No wheezing, rhonchi or rales.  Abdominal:     General: Abdomen  is protuberant. There is no distension.     Palpations: Abdomen is soft. There is no hepatomegaly, splenomegaly or mass.  Musculoskeletal:        General: No swelling or tenderness. Normal range of motion.     Cervical back: Neck supple.     Right lower leg: Edema present.     Left lower leg: Edema present.     Comments: Anterior right lower leg just proximal to the ankle there is a localized area of vascular prominence that looks like a varicosity.  There is pitting edema in the right lower extremity degrades is 1+ slightly more prominent than the pitting edema in the left lower extremity.  There is no warmth, streaking, induration, fluctuance, ulcers, blisters, or breakdown of the skin.  Lymphadenopathy:     Cervical: No cervical adenopathy.  Skin:    General: Skin is warm and dry.     Findings: No rash.  Neurological:     General: No focal deficit present.     Mental Status: He is alert.     Lab Results  Component Value Date   WBC 4.7 06/14/2020   HGB 14.7 06/14/2020   HCT 42.5 06/14/2020   PLT 137 (L) 06/14/2020   GLUCOSE 104 (H) 05/09/2020   CHOL 186 05/09/2020   TRIG 90.0 05/09/2020   HDL 37.30 (L) 05/09/2020   LDLDIRECT 296.1 08/12/2013   LDLCALC 131 (H) 05/09/2020   ALT 18 05/09/2020   AST 17 05/09/2020   NA 139 05/09/2020   K 3.6 05/09/2020   CL 104 05/09/2020   CREATININE 0.99 05/09/2020   BUN 16 05/09/2020   CO2 28 05/09/2020   TSH 1.91 05/09/2020   PSA 0.00 (L) 05/09/2020   INR 0.99 07/31/2011   HGBA1C 5.8 03/09/2014    US Venous Img Lower Unilateral Right (DVT)  Result Date: 06/16/2020 CLINICAL DATA:  Right lower extremity pain and edema. Evaluate for DVT. EXAM: RIGHT LOWER EXTREMITY VENOUS DOPPLER ULTRASOUND TECHNIQUE: Gray-scale sonography with graded compression, as well as color Doppler and duplex ultrasound were performed to evaluate the lower extremity deep venous systems from the level of the common femoral vein and including the common femoral,  femoral, profunda femoral, popliteal and calf veins including the posterior tibial, peroneal and gastrocnemius veins when visible. The superficial great saphenous vein was also interrogated. Spectral Doppler was utilized to evaluate flow at rest and with distal augmentation maneuvers in the common femoral, femoral and popliteal veins. COMPARISON:  None. FINDINGS: Contralateral Common Femoral Vein: Respiratory phasicity is normal and symmetric with the symptomatic side. No evidence of thrombus. Normal compressibility. Common Femoral Vein: No evidence of thrombus. Normal compressibility, respiratory phasicity and response to augmentation. Saphenofemoral Junction: No evidence of thrombus. Normal compressibility and flow on color Doppler imaging. Profunda Femoral  Vein: No evidence of thrombus. Normal compressibility and flow on color Doppler imaging. Femoral Vein: No evidence of thrombus. Normal compressibility, respiratory phasicity and response to augmentation. Popliteal Vein: No evidence of thrombus. Normal compressibility, respiratory phasicity and response to augmentation. Calf Veins: No evidence of thrombus. Normal compressibility and flow on color Doppler imaging. Superficial Great Saphenous Vein: No evidence of thrombus. Normal compressibility. Venous Reflux:  None. Other Findings:  None. IMPRESSION: No evidence of DVT within the right lower extremity. Electronically Signed   By: Sandi Mariscal M.D.   On: 06/16/2020 15:38    Assessment & Plan:   Andrew Joseph was seen today for leg swelling and hypertension.  Diagnoses and all orders for this visit:  Pain and swelling of right lower leg- He has symptomatic lower extremity edema, worse on the right than the left, there is a varicosity on the right lower leg.  His labs are remarkable only for slightly elevated D-dimer.  An ultrasound is negative for deep venous thrombosis.  I think the symptoms are caused by the calcium channel blocker so I have asked him to stop  taking amlodipine. -     CBC with Differential/Platelet; Future -     Sedimentation rate; Future -     D-dimer, quantitative (not at [facility]); Future -     Cancel: VAS [place] LOWER EXTREMITY VENOUS (DVT); Future -     D-dimer, quantitative (not at [facility]) -     Sedimentation rate -     CBC with Differential/Platelet -     US Venous Img Lower Unilateral Right (DVT); Future  Essential hypertension- His systolic blood pressure is 116 today and he complains of lower extremity edema.  I have asked him to stop taking amlodipine.  He will continue the combination of an ARB and thiazide diuretic.   I have discontinued Elliot Dally. Speers Jr.'s amLODipine. I am also having him maintain his desloratadine, fluticasone, Vitamin D3, irbesartan-hydrochlorothiazide, labetalol, potassium chloride, aspirin EC, rosuvastatin, zolpidem, and triamcinolone cream.  No orders of the defined types were placed in this encounter.  I spent 60 minutes in preparing to see the patient by review of recent labs, imaging and procedures, obtaining and reviewing separately obtained history, communicating with the patient and family or caregiver, ordering medications, tests or procedures, and documenting clinical information in the EHR including the differential Dx, treatment, and any further evaluation and other management of 1. Pain and swelling of right lower leg 2. Essential hypertension    Follow-up: Return in about 3 weeks (around 07/05/2020).  Scarlette Calico, MD

## 2020-06-14 NOTE — Patient Instructions (Signed)
Edema  Edema is when you have too much fluid in your body or under your skin. Edema may make your legs, feet, and ankles swell up. Swelling is also common in looser tissues, like around your eyes. This is a common condition. It gets more common as you get older. There are many possible causes of edema. Eating too much salt (sodium) and being on your feet or sitting for a long time can cause edema in your legs, feet, and ankles. Hot weather may make edema worse. Edema is usually painless. Your skin may look swollen or shiny. Follow these instructions at home:  Keep the swollen body part raised (elevated) above the level of your heart when you are sitting or lying down.  Do not sit still or stand for a long time.  Do not wear tight clothes. Do not wear garters on your upper legs.  Exercise your legs. This can help the swelling go down.  Wear elastic bandages or support stockings as told by your doctor.  Eat a low-salt (low-sodium) diet to reduce fluid as told by your doctor.  Depending on the cause of your swelling, you may need to limit how much fluid you drink (fluid restriction).  Take over-the-counter and prescription medicines only as told by your doctor. Contact a doctor if:  Treatment is not working.  You have heart, liver, or kidney disease and have symptoms of edema.  You have sudden and unexplained weight gain. Get help right away if:  You have shortness of breath or chest pain.  You cannot breathe when you lie down.  You have pain, redness, or warmth in the swollen areas.  You have heart, liver, or kidney disease and get edema all of a sudden.  You have a fever and your symptoms get worse all of a sudden. Summary  Edema is when you have too much fluid in your body or under your skin.  Edema may make your legs, feet, and ankles swell up. Swelling is also common in looser tissues, like around your eyes.  Raise (elevate) the swollen body part above the level of your  heart when you are sitting or lying down.  Follow your doctor's instructions about diet and how much fluid you can drink (fluid restriction). This information is not intended to replace advice given to you by your health care provider. Make sure you discuss any questions you have with your health care provider. Document Revised: 11/21/2017 Document Reviewed: 12/06/2016 Elsevier Patient Education  2020 [employer]  

## 2020-06-16 ENCOUNTER — Ambulatory Visit
Admission: RE | Admit: 2020-06-16 | Discharge: 2020-06-16 | Disposition: A | Discharge status: Home / Self Care | Payer: Medicare PPO | Source: Ambulatory Visit | Attending: Internal Medicine | Admitting: Internal Medicine

## 2020-06-16 DIAGNOSIS — R600 Localized edema: Secondary | ICD-10-CM | POA: Diagnosis not present

## 2020-06-16 DIAGNOSIS — M79661 Pain in right lower leg: Secondary | ICD-10-CM

## 2020-06-16 DIAGNOSIS — M79604 Pain in right leg: Secondary | ICD-10-CM | POA: Diagnosis not present

## 2020-07-04 ENCOUNTER — Other Ambulatory Visit: Payer: Self-pay | Admitting: Internal Medicine

## 2020-07-04 NOTE — Telephone Encounter (Signed)
[place] Controlled Database Checked Last filled: 04/10/2020 (90) LOV w/you: 03/15/2020 Next appt w/you: none

## 2020-07-21 ENCOUNTER — Other Ambulatory Visit: Payer: Self-pay | Admitting: Internal Medicine

## 2020-08-14 ENCOUNTER — Other Ambulatory Visit: Payer: Self-pay | Admitting: Internal Medicine

## 2020-08-30 ENCOUNTER — Other Ambulatory Visit: Payer: Self-pay | Admitting: Cardiology

## 2020-08-31 ENCOUNTER — Other Ambulatory Visit: Payer: Self-pay | Admitting: Cardiology

## 2020-09-12 ENCOUNTER — Telehealth: Payer: Self-pay | Admitting: Internal Medicine

## 2020-09-12 MED ORDER — ZOLPIDEM TARTRATE 10 MG PO TABS: ORAL_TABLET | ORAL | 0 refills | Status: DC

## 2020-09-12 NOTE — Telephone Encounter (Signed)
Patient called and is wanted to know if he could get a prescription for zolpidem (AMBIEN) 5 MG tablet  But for 10mg . It can be sent to [place], [address].   Please call pt when rx is done. [phone]

## 2020-09-12 NOTE — Telephone Encounter (Signed)
Check [place] registry last filled 07/06/2020. Pls advise in absence of PCP.Marland KitchenJohny Chess

## 2020-09-12 NOTE — Telephone Encounter (Signed)
Notified pt Andrew Joseph sent rx to [facility].Marland KitchenJohny Chess

## 2020-09-12 NOTE — Telephone Encounter (Signed)
Okay to do a 30 day supply; sending to pharmacy on file;

## 2020-09-14 ENCOUNTER — Telehealth: Payer: Self-pay | Admitting: Internal Medicine

## 2020-09-14 DIAGNOSIS — K921 Melena: Secondary | ICD-10-CM

## 2020-09-14 DIAGNOSIS — D126 Benign neoplasm of colon, unspecified: Secondary | ICD-10-CM

## 2020-09-14 NOTE — Telephone Encounter (Signed)
Patient needs an order for a colonoscopy, received message that it was time to get it  Patient noticing blood on paper when goes to the bathroom  Would like to get scheduled asap, sometime in November

## 2020-09-15 NOTE — Telephone Encounter (Signed)
MD is out of the office until 10/21. Will forward to his desktop to place referral../lmb

## 2020-09-19 NOTE — Telephone Encounter (Signed)
Ref was placed OV w/me q 6 mo pls Thx

## 2020-09-19 NOTE — Telephone Encounter (Signed)
Pt notified that referral was placed.  OV appt made for 12/18/20 based on OV on 06/14/20.  Pt also wanted to know why it was being recommended that he have OV every 54mo.

## 2020-09-22 ENCOUNTER — Ambulatory Visit: Payer: Medicare PPO | Admitting: Cardiology

## 2020-09-22 ENCOUNTER — Ambulatory Visit (INDEPENDENT_AMBULATORY_CARE_PROVIDER_SITE_OTHER): Payer: Medicare PPO

## 2020-09-22 ENCOUNTER — Encounter: Payer: Self-pay | Admitting: Cardiology

## 2020-09-22 ENCOUNTER — Other Ambulatory Visit: Payer: Self-pay

## 2020-09-22 VITALS — BP 172/73 | HR 58 | Ht 68.0 in | Wt 174.4 lb

## 2020-09-22 DIAGNOSIS — Z0000 Encounter for general adult medical examination without abnormal findings: Secondary | ICD-10-CM | POA: Diagnosis not present

## 2020-09-22 DIAGNOSIS — Z955 Presence of coronary angioplasty implant and graft: Secondary | ICD-10-CM

## 2020-09-22 DIAGNOSIS — I2510 Atherosclerotic heart disease of native coronary artery without angina pectoris: Secondary | ICD-10-CM

## 2020-09-22 DIAGNOSIS — I10 Essential (primary) hypertension: Secondary | ICD-10-CM | POA: Diagnosis not present

## 2020-09-22 DIAGNOSIS — Z7189 Other specified counseling: Secondary | ICD-10-CM

## 2020-09-22 DIAGNOSIS — E7800 Pure hypercholesterolemia, unspecified: Secondary | ICD-10-CM

## 2020-09-22 NOTE — Progress Notes (Signed)
Cardiology Office Note:    Date:  09/22/2020   ID:  Andrew Essex., DOB 07/01/1946, MRN [id number]  PCP:  [name], MD  Cardiologist:  [name], MD  Referring MD: [name], MD   CC: follow up  History of Present Illness:    Andrew Mota. is a 74 y.o. male with a hx of CAD with remote stent who is seen for follow up. I initially met him 09/07/2019 as a new consult at the request of [name], [name], MD for the evaluation and management of CAD.  CV history: distal LAD stent, last cath 2011/02/24  Father died of MI age 13.  Cardiovascular risk factors: Prior clinical ASCVD: CAD with PCI to LAD. ?CVA in 02/24/2006 (quarter sized bleed in his brain--lightheadedness/dizziness), unclear ?seizure event 02-24-11 Comorbid conditions: hypertension, hyperlipidemia. No diabetes, chronic kidney disease:  Metabolic syndrome/Obesity: BMI 27 Chronic inflammatory conditions: none Tobacco use history: never Family history: father died age 81 from MI, 3 ppd smoker, overweight. Mother diaed age 68 of stroke. Brother has diabetes. 3 children, all heathly Prior cardiac testing: cath 2010, 2012. Echo 02-24-14. MPI 1997, [id number]-[id number]/26/03 Exercise level: had been daily gym, up to 7 miles/10k steps, prior to Covid. Now walking only sporadically. No limitations.  Current diet: doesn't cook at home. Mostly chicken, fish, vegetables. Enjoys ice cream, pastry rarely.  Hypercholesterolemia: last LDL 124. Was told in the past his total cholesterol was over 400. Was happy that his total cholesterol is less than 200.  Takes aspirin 325 mg daily, has for 25 years. Discussed guidelines, plans to wean down slowly to 81.  On ezetimibe-simvastatin. Does not think he has been on anything else, has always taken what has been prescribed. Ok with change to high intensity statin.   Denies chest pain, shortness of breath at rest or with normal exertion. No PND, orthopnea, LE edema or  unexpected weight gain. No syncope or palpitations.  Today: Having a lot of stress with pending retirement, discussed today. We discussed how stress can affect the heart, blood pressure, and heart rate. We discussed some methods of managing both his stress as well as staying involved with causes that are important to him post-retirement. He was appreciative of these recommendations.  Discussed walking, goals based on guidelines. He will work on routine walking. Has lost 20 lbs intentionally since July, congratulated on this.  Denies chest pain, shortness of breath at rest or with normal exertion. No PND, orthopnea, LE edema or unexpected weight gain. No syncope or palpitations.  Past Medical History:  Diagnosis Date  . Allergic rhinitis, cause unspecified   . Anxiety   . Blood in stool   . BPH (benign prostatic hyperplasia)   . CAD (coronary artery disease)    DES mid lad 08/15/2009  . Cancer [facility])    prostate  . Chest pain, unspecified   . Colon polyps   . CVA (cerebrovascular accident due to intracerebral hemorrhage) [facility]) 2007 Spring  . History of prostate cancer Feb 25, 2008   Dr [name]  . HTN (hypertension)   . Hypercholesterolemia   . Hyperlipidemia   . Hypokalemia   . Insomnia, persistent   . Neoplasm of uncertain behavior of skin   . Stroke [facility])    February 24, 2006  . Syncope   . Tubular adenoma of colon 2009-02-24    Past Surgical History:  Procedure Laterality Date  . ANGIOPLASTY     with stents  . COLONOSCOPY    .  PROSTATECTOMY  2009  . TONSILLECTOMY      Current Medications: Current Outpatient Medications on File Prior to Visit  Medication Sig  . aspirin EC 81 MG tablet Take 1 tablet (81 mg total) by mouth daily.  . Cholecalciferol (VITAMIN D3) 2000 UNITS capsule Take 1 capsule (2,000 Units total) by mouth daily.  [name] desloratadine (CLARINEX) 5 MG tablet Take 1 tablet (5 mg total) by mouth daily.  . fluticasone (FLONASE) 50 MCG/ACT nasal spray Place 2 sprays into both nostrils  daily.  . irbesartan-hydrochlorothiazide (AVALIDE) 150-12.5 MG tablet TAKE 1 TABLET BY MOUTH DAILY  . labetalol (NORMODYNE) 300 MG tablet TAKE 1/2 TABLET BY MOUTH TWICE DAILY  . potassium chloride (KLOR-CON) 8 MEQ tablet Take 1 tablet (8 mEq total) by mouth daily.  . rosuvastatin (CRESTOR) 20 MG tablet Take 20 mg by mouth daily.  [name] triamcinolone cream (KENALOG) 0.5 % Use tid prn  . zolpidem (AMBIEN) 10 MG tablet Take 1 tablet at night as directed   No current facility-administered medications on file prior to visit.     Allergies:   Amlodipine, Iodine, and Shellfish allergy   Social History   Tobacco Use  . Smoking status: Never Smoker  . Smokeless tobacco: Never Used  Substance Use Topics  . Alcohol use: Yes    Comment: Occasional glass of wine  . Drug use: No    Family History: family history includes Diabetes in his brother; Heart disease in his father; Stroke in his mother. There is no history of Colon polyps, Esophageal cancer, Rectal cancer, or Stomach cancer.  ROS:   Please see the history of present illness.  Additional pertinent ROS otherwise unremarkable.   EKGs/Labs/Other Studies Reviewed:    The following studies were reviewed today: Cath 07/31/2011 Left main coronary artery was a very short segment.  There was near side- by-side separate ostia.  No significant disease.  The proximal LAD had 30% to 40% tubular disease.  The mid LAD had 50% multiple discrete lesions.  This was unchanged from the previous catheterization done in 2010.  There was a widely patent stent in the distal LAD.  The LAD was a large artery that wrapped the apex.  The LAD actually supplied most of the PDA territory, so it was difficult to know what dominance the patient was.  Right coronary artery was small and had no significant disease.  The circumflex coronary artery was somewhat larger than right coronary artery, but again the PDA was mostly supplied by the distal LAD.  There was  20% to 30% ostial lesions proximally.  The ostium of the first obtuse marginal branch had a 40% lesion.  The patient also had a very large diagonal branch, which did not have significant disease and provided most of the anterolateral wall.  Left ventriculography.  Left ventriculography was normal.  EF was 60%. There was no gradient across the aortic valve and no MR.  Aortic pressure was 132/71.  LV pressure was somewhat inaccurate with the LV tracing at 160/11.  IMPRESSION:  The patient has continued moderate disease in the mid LAD after the diagonal takeoff.  I closely reviewed his previous film from 2010 and there has been no change in this disease.  His stent is widely patent and he does not have any new disease in the right coronary artery or circ.  Clinically when I talked to the patient it sounded like he had a seizure.  There is no antecedent chest pain, palpitations, or cardiac symptoms.  The patient was fairly insistent on having a diagnostic cath rather than a stress test.  I have encouraged the patient to follow up with Neurology.  We have already arranged for him to have an EEG, MRI with MRA, and neurological followup.  Given the patency of the stent, normal LV function would be highly unlikely that his "seizure event was cardiac in etiology."  At the end of the case, a TR band was placed with good hemostasis.  He tolerated the procedure well.  Cath done by Dr. Olevia Perches. During the procedure, he had a contrast reaction to the dye (reported prior iodine/shellfish allergy), was treated for this. Received PCI to LAD with stenosis form 95% to 0%.   EKG:  EKG is personally reviewed.  The ekg ordered today demonstrates sinus bradycardia, LAFB at 58 bpm  Recent Labs: 05/09/2020: ALT 18; BUN 16; Creatinine, Ser 0.99; Potassium 3.6; Sodium 139; TSH 1.91 06/14/2020: Hemoglobin 14.7; Platelets 137  Recent Lipid Panel    Component Value Date/Time   CHOL 186 05/09/2020 0959    TRIG 90.0 05/09/2020 0959   HDL 37.30 (L) 05/09/2020 0959   CHOLHDL 5 05/09/2020 0959   VLDL 18.0 05/09/2020 0959   LDLCALC 131 (H) 05/09/2020 0959   LDLDIRECT 296.1 08/12/2013 0936    Physical Exam:    VS:  BP (!) 172/73   Pulse (!) 58   Ht 5' 8"  (1.727 m)   Wt 174 lb 6.4 oz (79.1 kg)   SpO2 95%   BMI 26.52 kg/m   Recheck 170/78  Wt Readings from Last 3 Encounters:  09/22/20 174 lb 6.4 oz (79.1 kg)  06/14/20 193 lb 6 oz (87.7 kg)  03/15/20 186 lb (84.4 kg)    GEN: Well nourished, well developed in no acute distress HEENT: Normal, moist mucous membranes NECK: No JVD CARDIAC: regular rhythm, normal S1 and S2, no rubs or gallops. No murmur. VASCULAR: Radial and DP pulses 2+ bilaterally. No carotid bruits RESPIRATORY:  Clear to auscultation without rales, wheezing or rhonchi  ABDOMEN: Soft, non-tender, non-distended MUSCULOSKELETAL:  Ambulates independently SKIN: Warm and dry, no edema NEUROLOGIC:  Alert and oriented x 3. No focal neuro deficits noted. PSYCHIATRIC:  Normal affect   ASSESSMENT:    1. Essential hypertension   2. Coronary artery disease involving native coronary artery of native heart without angina pectoris   3. History of coronary angioplasty with insertion of stent   4. Pure hypercholesterolemia   5. Cardiac risk counseling   6. Counseling on health promotion and disease prevention    PLAN:    CAD with history of prior stent: asymptomatic. Reviewed guidelines for management -continue aspirin 81 mg, no issues with bleeding -continue rosuvastatin 20 mg daily -asymptomatic, exercising routinely. Discussed guidelines today  Hypertension: very elevated today, but reports significant stress. Has previously been well controlled -discussed some recommendations for stress management -continue irbesartan-HCTZ 150-12.5 mg daily -continue labetalol, has been on long term. If needed, consider switching to metoprolol succinate (if BP well controlled) or  carvedilol (if BP control needs to improve) at future visit -has been on amlodipine in the past. He will monitor BP. If remains elevated once stress better managed, he will contact me and we will discuss options -reviewed how to check home BP today  Hypercholesterolemia: LDL goal <70 -continue rosuvastatin 20 mg daily -recheck LDL at follow up if not done with PCP sooner  Cardiac risk counseling and prevention recommendations: -recommend heart healthy/Mediterranean diet, with whole grains, fruits, vegetable, fish, lean  meats, nuts, and olive oil. Limit salt. -recommend moderate walking, 3-5 times/week for 30-50 minutes each session. Aim for at least 150 minutes.week. Goal should be pace of 3 miles/hours, or walking 1.5 miles in 30 minutes -recommend avoidance of tobacco products. Avoid excess alcohol.  Plan for follow up: 3 mos or sooner PRN  Medication Adjustments/Labs and Tests Ordered: Current medicines are reviewed at length with the patient today.  Concerns regarding medicines are outlined above.  Orders Placed This Encounter  Procedures  . EKG 12-Lead   No orders of the defined types were placed in this encounter.   Patient Instructions  Medication Instructions:  Your Physician recommend you continue on your current medication as directed.    *If you need a refill on your cardiac medications before your next appointment, please call your pharmacy*   Lab Work: None ordered   Testing/Procedures: None ordered    Follow-Up: At [facility], you and your health needs are our priority.  As part of our continuing mission to provide you with exceptional heart care, we have created designated Provider Care Teams.  These Care Teams include your primary Cardiologist (physician) and Advanced Practice Providers (APPs -  Physician Assistants and Nurse Practitioners) who all work together to provide you with the care you need, when you need it.  We recommend signing up for the  patient portal called "MyChart".  Sign up information is provided on this After Visit Summary.  MyChart is used to connect with patients for Virtual Visits (Telemedicine).  Patients are able to view lab/test results, encounter notes, upcoming appointments, etc.  Non-urgent messages can be sent to your provider as well.   To learn more about what you can do with MyChart, go to [email or web address].    Your next appointment:   3 month(s)  The format for your next appointment:   In Person  Provider:   [name], MD  how to check blood pressure:  -sit comfortably in a chair, feet uncrossed and flat on floor, for 5-10 minutes  -arm ideally should rest at the level of the heart. However, arm should be relaxed and not tense (for example, do not hold the arm up unsupported)  -avoid exercise, caffeine, and tobacco for at least 30 minutes prior to BP reading  -don't take BP cuff reading over clothes (always place on skin directly)  -I prefer to know how well the medication is working, so I would like you to take your readings 1-2 hours after taking your blood pressure medication if possible  Would get arm cuff (not wrist). Omron is a good brand.   If blood pressures consistent >150/80, please call or message me and we will go up on your medications.    Signed, [name], MD PhD 09/22/2020    [place]

## 2020-09-22 NOTE — Patient Instructions (Signed)
Mr. Andrew Joseph , Thank you for taking time to come for your Medicare Wellness Visit. I appreciate your ongoing commitment to your health goals. Please review the following plan we discussed and let me know if I can assist you in the future.   Screening recommendations/referrals: Colonoscopy: 12/20/2014; due every 5 years Recommended yearly ophthalmology/optometry visit for glaucoma screening and checkup Recommended yearly dental visit for hygiene and checkup  Vaccinations: Influenza vaccine: declined Pneumococcal vaccine: declined Tdap vaccine: 04/19/2019 Shingles vaccine: declined   Covid-19: declined  Advanced directives: Advance directive discussed with you today. Even though you declined this today please call our office should you change your mind and we can give you the proper paperwork for you to fill out.  Conditions/risks identified: Yes; Reviewed health maintenance screenings with patient today and relevant education, vaccines, and/or referrals were provided. Please continue to do your personal lifestyle choices by: daily care of teeth and gums, regular physical activity (goal should be 5 days a week for 30 minutes), eat a healthy diet, avoid tobacco and drug use, limiting any alcohol intake, taking a low-dose aspirin (if not allergic or have been advised by your provider otherwise) and taking vitamins and minerals as recommended by your provider. Continue doing brain stimulating activities (puzzles, reading, adult coloring books, staying active) to keep memory sharp. Continue to eat heart healthy diet (full of fruits, vegetables, whole grains, lean protein, water--limit salt, fat, and sugar intake) and increase physical activity as tolerated.  Next appointment: Please schedule your next Medicare Wellness Visit with your Nurse Health Advisor in 1 year by calling [phone].  Preventive Care 75 Years and Older, Male Preventive care refers to lifestyle choices and visits with your health  care provider that can promote health and wellness. What does preventive care include?  A yearly physical exam. This is also called an annual well check.  Dental exams once or twice a year.  Routine eye exams. Ask your health care provider how often you should have your eyes checked.  Personal lifestyle choices, including:  Daily care of your teeth and gums.  Regular physical activity.  Eating a healthy diet.  Avoiding tobacco and drug use.  Limiting alcohol use.  Practicing safe sex.  Taking low doses of aspirin every day.  Taking vitamin and mineral supplements as recommended by your health care provider. What happens during an annual well check? The services and screenings done by your health care provider during your annual well check will depend on your age, overall health, lifestyle risk factors, and family history of disease. Counseling  Your health care provider may ask you questions about your:  Alcohol use.  Tobacco use.  Drug use.  Emotional well-being.  Home and relationship well-being.  Sexual activity.  Eating habits.  History of falls.  Memory and ability to understand (cognition).  Work and work Statistician. Screening  You may have the following tests or measurements:  Height, weight, and BMI.  Blood pressure.  Lipid and cholesterol levels. These may be checked every 5 years, or more frequently if you are over 26 years old.  Skin check.  Lung cancer screening. You may have this screening every year starting at age 75 if you have a 30-pack-year history of smoking and currently smoke or have quit within the past 15 years.  Fecal occult blood test (FOBT) of the stool. You may have this test every year starting at age 75.  Flexible sigmoidoscopy or colonoscopy. You may have a sigmoidoscopy every 5 years or  a colonoscopy every 10 years starting at age 75.  Prostate cancer screening. Recommendations will vary depending on your family  history and other risks.  Hepatitis C blood test.  Hepatitis B blood test.  Sexually transmitted disease (STD) testing.  Diabetes screening. This is done by checking your blood sugar (glucose) after you have not eaten for a while (fasting). You may have this done every 1-3 years.  Abdominal aortic aneurysm (AAA) screening. You may need this if you are a current or former smoker.  Osteoporosis. You may be screened starting at age 65 if you are at high risk. Talk with your health care provider about your test results, treatment options, and if necessary, the need for more tests. Vaccines  Your health care provider may recommend certain vaccines, such as:  Influenza vaccine. This is recommended every year.  Tetanus, diphtheria, and acellular pertussis (Tdap, Td) vaccine. You may need a Td booster every 10 years.  Zoster vaccine. You may need this after age 75.  Pneumococcal 13-valent conjugate (PCV13) vaccine. One dose is recommended after age 75.  Pneumococcal polysaccharide (PPSV23) vaccine. One dose is recommended after age 10. Talk to your health care provider about which screenings and vaccines you need and how often you need them. This information is not intended to replace advice given to you by your health care provider. Make sure you discuss any questions you have with your health care provider. Document Released: 12/15/2015 Document Revised: 08/07/2016 Document Reviewed: 09/19/2015 Elsevier Interactive Patient Education  2017 [place] Prevention in the Home Falls can cause injuries. They can happen to people of all ages. There are many things you can do to make your home safe and to help prevent falls. What can I do on the outside of my home?  Regularly fix the edges of walkways and driveways and fix any cracks.  Remove anything that might make you trip as you walk through a door, such as a raised step or threshold.  Trim any bushes or trees on the path to  your home.  Use bright outdoor lighting.  Clear any walking paths of anything that might make someone trip, such as rocks or tools.  Regularly check to see if handrails are loose or broken. Make sure that both sides of any steps have handrails.  Any raised decks and porches should have guardrails on the edges.  Have any leaves, snow, or ice cleared regularly.  Use sand or salt on walking paths during winter.  Clean up any spills in your garage right away. This includes oil or grease spills. What can I do in the bathroom?  Use night lights.  Install grab bars by the toilet and in the tub and shower. Do not use towel bars as grab bars.  Use non-skid mats or decals in the tub or shower.  If you need to sit down in the shower, use a plastic, non-slip stool.  Keep the floor dry. Clean up any water that spills on the floor as soon as it happens.  Remove soap buildup in the tub or shower regularly.  Attach bath mats securely with double-sided non-slip rug tape.  Do not have throw rugs and other things on the floor that can make you trip. What can I do in the bedroom?  Use night lights.  Make sure that you have a light by your bed that is easy to reach.  Do not use any sheets or blankets that are too big for your bed.  They should not hang down onto the floor.  Have a firm chair that has side arms. You can use this for support while you get dressed.  Do not have throw rugs and other things on the floor that can make you trip. What can I do in the kitchen?  Clean up any spills right away.  Avoid walking on wet floors.  Keep items that you use a lot in easy-to-reach places.  If you need to reach something above you, use a strong step stool that has a grab bar.  Keep electrical cords out of the way.  Do not use floor polish or wax that makes floors slippery. If you must use wax, use non-skid floor wax.  Do not have throw rugs and other things on the floor that can make  you trip. What can I do with my stairs?  Do not leave any items on the stairs.  Make sure that there are handrails on both sides of the stairs and use them. Fix handrails that are broken or loose. Make sure that handrails are as long as the stairways.  Check any carpeting to make sure that it is firmly attached to the stairs. Fix any carpet that is loose or worn.  Avoid having throw rugs at the top or bottom of the stairs. If you do have throw rugs, attach them to the floor with carpet tape.  Make sure that you have a light switch at the top of the stairs and the bottom of the stairs. If you do not have them, ask someone to add them for you. What else can I do to help prevent falls?  Wear shoes that:  Do not have high heels.  Have rubber bottoms.  Are comfortable and fit you well.  Are closed at the toe. Do not wear sandals.  If you use a stepladder:  Make sure that it is fully opened. Do not climb a closed stepladder.  Make sure that both sides of the stepladder are locked into place.  Ask someone to hold it for you, if possible.  Clearly mark and make sure that you can see:  Any grab bars or handrails.  First and last steps.  Where the edge of each step is.  Use tools that help you move around (mobility aids) if they are needed. These include:  Canes.  Walkers.  Scooters.  Crutches.  Turn on the lights when you go into a dark area. Replace any light bulbs as soon as they burn out.  Set up your furniture so you have a clear path. Avoid moving your furniture around.  If any of your floors are uneven, fix them.  If there are any pets around you, be aware of where they are.  Review your medicines with your doctor. Some medicines can make you feel dizzy. This can increase your chance of falling. Ask your doctor what other things that you can do to help prevent falls. This information is not intended to replace advice given to you by your health care provider.  Make sure you discuss any questions you have with your health care provider. Document Released: 09/14/2009 Document Revised: 04/25/2016 Document Reviewed: 12/23/2014 Elsevier Interactive Patient Education  2017 [employer].

## 2020-09-22 NOTE — Patient Instructions (Addendum)
Medication Instructions:  Your Physician recommend you continue on your current medication as directed.    *If you need a refill on your cardiac medications before your next appointment, please call your pharmacy*   Lab Work: None ordered   Testing/Procedures: None ordered    Follow-Up: At [facility], you and your health needs are our priority.  As part of our continuing mission to provide you with exceptional heart care, we have created designated Provider Care Teams.  These Care Teams include your primary Cardiologist (physician) and Advanced Practice Providers (APPs -  Physician Assistants and Nurse Practitioners) who all work together to provide you with the care you need, when you need it.  We recommend signing up for the patient portal called "MyChart".  Sign up information is provided on this After Visit Summary.  MyChart is used to connect with patients for Virtual Visits (Telemedicine).  Patients are able to view lab/test results, encounter notes, upcoming appointments, etc.  Non-urgent messages can be sent to your provider as well.   To learn more about what you can do with MyChart, go to [email or web address].    Your next appointment:   3 month(s)  The format for your next appointment:   In Person  Provider:   [name], MD  how to check blood pressure:  -sit comfortably in a chair, feet uncrossed and flat on floor, for 5-10 minutes  -arm ideally should rest at the level of the heart. However, arm should be relaxed and not tense (for example, do not hold the arm up unsupported)  -avoid exercise, caffeine, and tobacco for at least 30 minutes prior to BP reading  -don't take BP cuff reading over clothes (always place on skin directly)  -I prefer to know how well the medication is working, so I would like you to take your readings 1-2 hours after taking your blood pressure medication if possible  Would get arm cuff (not wrist). Omron is a good brand.    If blood pressures consistent >150/80, please call or message me and we will go up on your medications.

## 2020-09-22 NOTE — Progress Notes (Addendum)
Subjective:   Andrew Joseph. is a 74 y.o. male who presents for Medicare Annual/Subsequent preventive examination.  Review of Systems    No ROS. Medicare Wellness Visit. Additional risk factors are reflected in social history. Cardiac Risk Factors include: advanced age (>44men, >15 women);dyslipidemia;family history of premature cardiovascular disease;hypertension;male gender     Objective:    There were no vitals filed for this visit. There is no height or weight on file to calculate BMI.  Advanced Directives 09/22/2020 12/08/2014 08/17/2014  Does Patient Have a Medical Advance Directive? No No No  Would patient like information on creating a medical advance directive? No - Patient declined - No - patient declined information    Current Medications (verified) Outpatient Encounter Medications as of 09/22/2020  Medication Sig   aspirin EC 81 MG tablet Take 1 tablet (81 mg total) by mouth daily. (Patient taking differently: Take 81 mg by mouth in the morning and at bedtime. )   Cholecalciferol (VITAMIN D3) 2000 UNITS capsule Take 1 capsule (2,000 Units total) by mouth daily.   desloratadine (CLARINEX) 5 MG tablet Take 1 tablet (5 mg total) by mouth daily.   fluticasone (FLONASE) 50 MCG/ACT nasal spray Place 2 sprays into both nostrils daily.   irbesartan-hydrochlorothiazide (AVALIDE) 150-12.5 MG tablet TAKE 1 TABLET BY MOUTH DAILY   labetalol (NORMODYNE) 300 MG tablet TAKE 1/2 TABLET BY MOUTH TWICE DAILY   potassium chloride (KLOR-CON) 8 MEQ tablet Take 1 tablet (8 mEq total) by mouth daily.   rosuvastatin (CRESTOR) 20 MG tablet Take 20 mg by mouth daily.   triamcinolone cream (KENALOG) 0.5 % Use tid prn   zolpidem (AMBIEN) 10 MG tablet Take 1 tablet at night as directed   No facility-administered encounter medications on file as of 09/22/2020.    Allergies (verified) Amlodipine, Iodine, and Shellfish allergy   History: Past Medical History:  Diagnosis Date   Allergic  rhinitis, cause unspecified    Anxiety    Blood in stool    BPH (benign prostatic hyperplasia)    CAD (coronary artery disease)    DES mid lad 08/15/2009   Cancer ([place])    prostate   Chest pain, unspecified    Colon polyps    CVA (cerebrovascular accident due to intracerebral hemorrhage) ([place]) 2007 Spring   History of prostate cancer 2009   Dr [name]   HTN (hypertension)    Hypercholesterolemia    Hyperlipidemia    Hypokalemia    Insomnia, persistent    Neoplasm of uncertain behavior of skin    Stroke [facility])    2007   Syncope    Tubular adenoma of colon 2010   Past Surgical History:  Procedure Laterality Date   ANGIOPLASTY     with stents   COLONOSCOPY     PROSTATECTOMY  2009   TONSILLECTOMY     Family History  Problem Relation Age of Onset   Stroke Mother    Heart disease Father        MI   Diabetes Brother        Obese   Colon polyps Neg Hx    Esophageal cancer Neg Hx    Rectal cancer Neg Hx    Stomach cancer Neg Hx    Social History   Socioeconomic History   Marital status: Divorced    Spouse name: Not on file   Number of children: 3   Years of education: Not on file   Highest education level: Not on file  Occupational History   Occupation: DIST Corporate investment banker: [employer]    Comment: Semi-retired   Tobacco Use   Smoking status: Never Smoker   Smokeless tobacco: Never Used  Substance and Sexual Activity   Alcohol use: Yes    Comment: Occasional glass of wine   Drug use: No   Sexual activity: Not Currently  Other Topics Concern   Not on file  Social History Narrative   Not on file   Social Determinants of Health   Financial Resource Strain: Low Risk    Difficulty of Paying Living Expenses: Not hard at all  Food Insecurity: No Food Insecurity   Worried About Charity fundraiser in the Last Year: Never true   Ran Out of Food in the Last Year: Never true  Transportation Needs: No Transportation Needs   Lack of Transportation (Medical):  No   Lack of Transportation (Non-Medical): No  Physical Activity: Sufficiently Active   Days of Exercise per Week: 5 days   Minutes of Exercise per Session: 30 min  Stress: No Stress Concern Present   Feeling of Stress : Not at all  Social Connections: Socially Integrated   Frequency of Communication with Friends and Family: More than three times a week   Frequency of Social Gatherings with Friends and Family: More than three times a week   Attends Religious Services: More than 4 times per year   Active Member of [employer] or Organizations: Yes   Attends Music therapist: More than 4 times per year   Marital Status: Married    Tobacco Counseling Counseling given: Not Answered   Clinical Intake:  Pre-visit preparation completed: Yes  Pain : No/denies pain     Nutritional Risks: None Diabetes: No  What is the last grade level you completed in school?: [place]  Diabetic? no  Interpreter Needed?: No  Information entered by :: Lisette Abu, LPN   Activities of Daily Living In your present state of health, do you have any difficulty performing the following activities: 09/22/2020  Hearing? N  Vision? N  Difficulty concentrating or making decisions? N  Walking or climbing stairs? N  Dressing or bathing? N  Doing errands, shopping? N  Preparing Food and eating ? N  Using the Toilet? N  In the past six months, have you accidently leaked urine? N  Do you have problems with loss of bowel control? N  Managing your Medications? N  Managing your Finances? N  Housekeeping or managing your Housekeeping? N  Some recent data might be hidden    Patient Care Team: [name], [name], MD as PCP - General [name], MD as PCP - Cardiology (Cardiology) Josue Hector, MD as Consulting Physician (Cardiology)  Indicate any recent Medical Services you may have received from other than [facility] providers in the past year (date may be approximate).       Assessment:   This is a routine wellness examination for [facility]  Hearing/Vision screen No exam data present  Dietary issues and exercise activities discussed: Current Exercise Habits: Home exercise routine, Type of exercise: walking, Time (Minutes): 30, Frequency (Times/Week): 5, Weekly Exercise (Minutes/Week): 150, Intensity: Moderate, Exercise limited by: None identified  Goals      Patient Stated     To maintain my current health status by continuing to eat healthy, stay physically active and socially active.       Depression Screen PHQ 2/9 Scores 09/22/2020 10/21/2019 08/31/2018 01/20/2017 09/01/2015  PHQ - 2 Score 0 0 0 0 0    Fall Risk Fall Risk  09/22/2020 10/21/2019 08/31/2018 01/20/2017 09/01/2015  Falls in the past year? 0 0 No No No  Number falls in past yr: 0 0 - - -  Injury with Fall? 0 0 - - -  Risk for fall due to : No Fall Risks - - - -  Follow up Falls evaluation completed Falls evaluation completed - - -    Any stairs in or around the home? Yes  If so, are there any without handrails? No  Home free of loose throw rugs in walkways, pet beds, electrical cords, etc? Yes  Adequate lighting in your home to reduce risk of falls? Yes   ASSISTIVE DEVICES UTILIZED TO PREVENT FALLS:  Life alert? No  Use of a cane, walker or w/c? No  Grab bars in the bathroom? No  Shower chair or bench in shower? No  Elevated toilet seat or a handicapped toilet? No   TIMED UP AND GO:  Was the test performed? No .  Length of time to ambulate 10 feet: 0 sec.   Gait steady and fast without use of assistive device  Cognitive Function:        Immunizations Immunization History  Administered Date(s) Administered   Tdap 03/21/2013, 04/19/2019    TDAP status: Up to date Flu Vaccine status: Declined, Education has been provided regarding the importance of this vaccine but patient still declined. Advised may receive this vaccine at local pharmacy or Health Dept. Aware to  provide a copy of the vaccination record if obtained from local pharmacy or Health Dept. Verbalized acceptance and understanding. Pneumococcal vaccine status: Declined,  Education has been provided regarding the importance of this vaccine but patient still declined. Advised may receive this vaccine at local pharmacy or Health Dept. Aware to provide a copy of the vaccination record if obtained from local pharmacy or Health Dept. Verbalized acceptance and understanding.  Covid-19 vaccine status: Declined, Education has been provided regarding the importance of this vaccine but patient still declined. Advised may receive this vaccine at local pharmacy or Health Dept.or vaccine clinic. Aware to provide a copy of the vaccination record if obtained from local pharmacy or Health Dept. Verbalized acceptance and understanding.  Qualifies for Shingles Vaccine? Yes   Zostavax completed No   Shingrix Completed?: No.    Education has been provided regarding the importance of this vaccine. Patient has been advised to call insurance company to determine out of pocket expense if they have not yet received this vaccine. Advised may also receive vaccine at local pharmacy or Health Dept. Verbalized acceptance and understanding.  Screening Tests Health Maintenance  Topic Date Due   Hepatitis C Screening  Never done   COVID-19 Vaccine (1) Never done   COLONOSCOPY  12/21/2019   INFLUENZA VACCINE  Never done   PNA vac Low Risk Adult (1 of 2 - PCV13) 10/13/2020 (Originally 04/20/2010)   TETANUS/TDAP  04/18/2029    Health Maintenance  Health Maintenance Due  Topic Date Due   Hepatitis C Screening  Never done   COVID-19 Vaccine (1) Never done   COLONOSCOPY  12/21/2019   INFLUENZA VACCINE  Never done    Colorectal cancer screening: Completed 12/20/2014. Repeat every 5 years  Lung Cancer Screening: (Low Dose CT Chest recommended if Age 62-80 years, 30 pack-year currently smoking OR have quit w/in 15years.) does not  qualify.   Lung Cancer Screening Referral: no  Additional Screening:  Hepatitis C Screening: does qualify; Completed no  Vision Screening: Recommended annual ophthalmology exams for early detection of glaucoma and other disorders of the eye. Is the patient up to date with their annual eye exam?  No  Who is the provider or what is the name of the office in which the patient attends annual eye exams? Refused  If pt is not established with a provider, would they like to be referred to a provider to establish care? No .   Dental Screening: Recommended annual dental exams for proper oral hygiene  Community Resource Referral / Chronic Care Management: CRR required this visit?  No   CCM required this visit?  No      Plan:     I have personally reviewed and noted the following in the patient's chart:   Medical and social history Use of alcohol, tobacco or illicit drugs  Current medications and supplements Functional ability and status Nutritional status Physical activity Advanced directives List of other physicians Hospitalizations, surgeries, and ER visits in previous 12 months Vitals Screenings to include cognitive, depression, and falls Referrals and appointments  In addition, I have reviewed and discussed with patient certain preventive protocols, quality metrics, and best practice recommendations. A written personalized care plan for preventive services as well as general preventive health recommendations were provided to patient.     Sheral Flow, LPN   [id number]/[id number]   Nurse Notes:  Patient is cogitatively intact. There were no vitals filed for this visit. There is no height or weight on file to calculate BMI. Patient stated that he has no issues with gait or balance; does not use any assistive devices.  Medical screening examination/treatment/procedure(s) were performed by non-physician practitioner and as supervising physician I was immediately available for  consultation/collaboration.  I agree with above. Lew Dawes, MD

## 2020-09-29 ENCOUNTER — Encounter: Payer: Self-pay | Admitting: Cardiology

## 2020-09-29 DIAGNOSIS — Z955 Presence of coronary angioplasty implant and graft: Secondary | ICD-10-CM | POA: Insufficient documentation

## 2020-10-16 ENCOUNTER — Other Ambulatory Visit: Payer: Self-pay | Admitting: Family

## 2020-10-17 ENCOUNTER — Ambulatory Visit (INDEPENDENT_AMBULATORY_CARE_PROVIDER_SITE_OTHER): Payer: Medicare PPO | Admitting: Internal Medicine

## 2020-10-17 ENCOUNTER — Encounter: Payer: Self-pay | Admitting: Internal Medicine

## 2020-10-17 ENCOUNTER — Other Ambulatory Visit: Payer: Self-pay

## 2020-10-17 DIAGNOSIS — E785 Hyperlipidemia, unspecified: Secondary | ICD-10-CM

## 2020-10-17 DIAGNOSIS — F5101 Primary insomnia: Secondary | ICD-10-CM | POA: Diagnosis not present

## 2020-10-17 DIAGNOSIS — Z8546 Personal history of malignant neoplasm of prostate: Secondary | ICD-10-CM | POA: Diagnosis not present

## 2020-10-17 DIAGNOSIS — R21 Rash and other nonspecific skin eruption: Secondary | ICD-10-CM | POA: Insufficient documentation

## 2020-10-17 MED ORDER — ZOLPIDEM TARTRATE 10 MG PO TABS: ORAL_TABLET | ORAL | 1 refills | Status: DC

## 2020-10-17 MED ORDER — HYDROXYCHLOROQUINE SULFATE 200 MG PO TABS: ORAL_TABLET | ORAL | 0 refills | Status: DC

## 2020-10-17 MED ORDER — METHYLPREDNISOLONE 4 MG PO TBPK: ORAL_TABLET | ORAL | 0 refills | Status: DC

## 2020-10-17 MED ORDER — TRIAMCINOLONE ACETONIDE 0.5 % EX CREA: TOPICAL_CREAM | CUTANEOUS | 1 refills | Status: DC

## 2020-10-17 NOTE — Assessment & Plan Note (Signed)
PSA

## 2020-10-17 NOTE — Assessment & Plan Note (Signed)
Labs

## 2020-10-17 NOTE — Assessment & Plan Note (Signed)
11/21 R forearm ?etiology Medrol pack Triamc  topically

## 2020-10-17 NOTE — Progress Notes (Addendum)
Subjective:  Patient ID: Andrew Essex., male    DOB: 1945/12/03  Age: 74 y.o. MRN: [id number]  CC: Rash ((R) arm. Pt states it itches and is red)   HPI Andrew Essex. presents for rash on the R forearm itchy since last week - better now   Pt lost wt on diet   The patient was asked to get vaccinated for Covid 19.  Outpatient Medications Prior to Visit  Medication Sig Dispense Refill  . aspirin EC 81 MG tablet Take 1 tablet (81 mg total) by mouth daily. (Patient taking differently: Take 81 mg by mouth in the morning and at bedtime. ) 90 tablet 3  . Cholecalciferol (VITAMIN D3) 2000 UNITS capsule Take 1 capsule (2,000 Units total) by mouth daily. 100 capsule 3  . desloratadine (CLARINEX) 5 MG tablet Take 1 tablet (5 mg total) by mouth daily. 30 tablet 11  . fluticasone (FLONASE) 50 MCG/ACT nasal spray Place 2 sprays into both nostrils daily. 16 g 3  . irbesartan-hydrochlorothiazide (AVALIDE) 150-12.5 MG tablet TAKE 1 TABLET BY MOUTH DAILY 90 tablet 1  . labetalol (NORMODYNE) 300 MG tablet TAKE 1/2 TABLET BY MOUTH TWICE DAILY 180 tablet 3  . rosuvastatin (CRESTOR) 20 MG tablet Take 20 mg by mouth daily.    [name] triamcinolone cream (KENALOG) 0.5 % Use tid prn 45 g 1  . zolpidem (AMBIEN) 10 MG tablet Take 1 tablet at night as directed 30 tablet 0  . potassium chloride (KLOR-CON) 8 MEQ tablet Take 1 tablet (8 mEq total) by mouth daily. 30 tablet 1   No facility-administered medications prior to visit.    ROS: Review of Systems  Constitutional: Positive for unexpected weight change. Negative for appetite change and fatigue.  HENT: Negative for congestion, nosebleeds, sneezing, sore throat and trouble swallowing.   Eyes: Negative for itching and visual disturbance.  Respiratory: Negative for cough.   Cardiovascular: Negative for chest pain, palpitations and leg swelling.  Gastrointestinal: Negative for abdominal distention, blood in stool, diarrhea and nausea.  Genitourinary:  Negative for frequency and hematuria.  Musculoskeletal: Negative for back pain, gait problem, joint swelling and neck pain.  Skin: Negative for rash.  Neurological: Negative for dizziness, tremors, speech difficulty and weakness.  Psychiatric/Behavioral: Positive for sleep disturbance. Negative for agitation, dysphoric mood and suicidal ideas. The patient is nervous/anxious.     Objective:  BP (!) 142/70 (BP Location: Left Arm)   Pulse 67   Temp 98.4 F (36.9 C) (Oral)   Wt 175 lb 9.6 oz (79.7 kg)   SpO2 97%   BMI 26.70 kg/m   BP Readings from Last 3 Encounters:  10/17/20 (!) 142/70  09/22/20 (!) 172/73  06/14/20 (!) 116/56    Wt Readings from Last 3 Encounters:  10/17/20 175 lb 9.6 oz (79.7 kg)  09/22/20 174 lb 6.4 oz (79.1 kg)  06/14/20 193 lb 6 oz (87.7 kg)    Physical Exam Constitutional:      General: He is not in acute distress.    Appearance: He is well-developed.     Comments: NAD  Eyes:     Conjunctiva/sclera: Conjunctivae normal.     Pupils: Pupils are equal, round, and reactive to light.  Neck:     Thyroid: No thyromegaly.     Vascular: No JVD.  Cardiovascular:     Rate and Rhythm: Normal rate and regular rhythm.     Heart sounds: Normal heart sounds. No murmur heard.  No friction rub. No  gallop.   Pulmonary:     Effort: Pulmonary effort is normal. No respiratory distress.     Breath sounds: Normal breath sounds. No wheezing or rales.  Chest:     Chest wall: No tenderness.  Abdominal:     General: Bowel sounds are normal. There is no distension.     Palpations: Abdomen is soft. There is no mass.     Tenderness: There is no abdominal tenderness. There is no guarding or rebound.  Musculoskeletal:        General: No tenderness. Normal range of motion.     Cervical back: Normal range of motion.  Lymphadenopathy:     Cervical: No cervical adenopathy.  Skin:    General: Skin is warm and dry.     Findings: No rash.  Neurological:     Mental Status: He  is alert and oriented to person, place, and time.     Cranial Nerves: No cranial nerve deficit.     Motor: No abnormal muscle tone.     Coordination: Coordination normal.     Gait: Gait normal.     Deep Tendon Reflexes: Reflexes are normal and symmetric.  Psychiatric:        Behavior: Behavior normal.        Thought Content: Thought content normal.        Judgment: Judgment normal.     Lab Results  Component Value Date   WBC 4.7 06/14/2020   HGB 14.7 06/14/2020   HCT 42.5 06/14/2020   PLT 137 (L) 06/14/2020   GLUCOSE 104 (H) 05/09/2020   CHOL 186 05/09/2020   TRIG 90.0 05/09/2020   HDL 37.30 (L) 05/09/2020   LDLDIRECT 296.1 08/12/2013   LDLCALC 131 (H) 05/09/2020   ALT 18 05/09/2020   AST 17 05/09/2020   NA 139 05/09/2020   K 3.6 05/09/2020   CL 104 05/09/2020   CREATININE 0.99 05/09/2020   BUN 16 05/09/2020   CO2 28 05/09/2020   TSH 1.91 05/09/2020   PSA 0.00 (L) 05/09/2020   INR 0.99 07/31/2011   HGBA1C 5.8 03/09/2014    US Venous Img Lower Unilateral Right (DVT)  Result Date: 06/16/2020 CLINICAL DATA:  Right lower extremity pain and edema. Evaluate for DVT. EXAM: RIGHT LOWER EXTREMITY VENOUS DOPPLER ULTRASOUND TECHNIQUE: Gray-scale sonography with graded compression, as well as color Doppler and duplex ultrasound were performed to evaluate the lower extremity deep venous systems from the level of the common femoral vein and including the common femoral, femoral, profunda femoral, popliteal and calf veins including the posterior tibial, peroneal and gastrocnemius veins when visible. The superficial great saphenous vein was also interrogated. Spectral Doppler was utilized to evaluate flow at rest and with distal augmentation maneuvers in the common femoral, femoral and popliteal veins. COMPARISON:  None. FINDINGS: Contralateral Common Femoral Vein: Respiratory phasicity is normal and symmetric with the symptomatic side. No evidence of thrombus. Normal compressibility. Common  Femoral Vein: No evidence of thrombus. Normal compressibility, respiratory phasicity and response to augmentation. Saphenofemoral Junction: No evidence of thrombus. Normal compressibility and flow on color Doppler imaging. Profunda Femoral Vein: No evidence of thrombus. Normal compressibility and flow on color Doppler imaging. Femoral Vein: No evidence of thrombus. Normal compressibility, respiratory phasicity and response to augmentation. Popliteal Vein: No evidence of thrombus. Normal compressibility, respiratory phasicity and response to augmentation. Calf Veins: No evidence of thrombus. Normal compressibility and flow on color Doppler imaging. Superficial Great Saphenous Vein: No evidence of thrombus. Normal compressibility. Venous Reflux:  None. Other Findings:  None. IMPRESSION: No evidence of DVT within the right lower extremity. Electronically Signed   By: Sandi Mariscal M.D.   On: 06/16/2020 15:38    Assessment & Plan:    [name], MD

## 2020-10-17 NOTE — Assessment & Plan Note (Signed)
Zolpidem prn  Potential benefits of a long term benzodiazepines  use as well as potential risks  and complications were explained to the patient and were aknowledged. 

## 2020-10-18 ENCOUNTER — Other Ambulatory Visit (INDEPENDENT_AMBULATORY_CARE_PROVIDER_SITE_OTHER): Payer: Medicare PPO

## 2020-10-18 DIAGNOSIS — E785 Hyperlipidemia, unspecified: Secondary | ICD-10-CM | POA: Diagnosis not present

## 2020-10-18 DIAGNOSIS — Z8546 Personal history of malignant neoplasm of prostate: Secondary | ICD-10-CM

## 2020-10-18 LAB — CBC WITH DIFFERENTIAL/PLATELET
Basophils Absolute: 0 10*3/uL (ref 0.0–0.1)
Basophils Relative: 0.6 % (ref 0.0–3.0)
Eosinophils Absolute: 0.2 10*3/uL (ref 0.0–0.7)
Eosinophils Relative: 4.3 % (ref 0.0–5.0)
HCT: 43.7 % (ref 39.0–52.0)
Hemoglobin: 14.8 g/dL (ref 13.0–17.0)
Lymphocytes Relative: 21.7 % (ref 12.0–46.0)
Lymphs Abs: 1 10*3/uL (ref 0.7–4.0)
MCHC: 33.9 g/dL (ref 30.0–36.0)
MCV: 88 fl (ref 78.0–100.0)
Monocytes Absolute: 0.4 10*3/uL (ref 0.1–1.0)
Monocytes Relative: 8.6 % (ref 3.0–12.0)
Neutro Abs: 2.9 10*3/uL (ref 1.4–7.7)
Neutrophils Relative %: 64.8 % (ref 43.0–77.0)
Platelets: 125 10*3/uL — ABNORMAL LOW (ref 150.0–400.0)
RBC: 4.97 Mil/uL (ref 4.22–5.81)
RDW: 15.4 % (ref 11.5–15.5)
WBC: 4.5 10*3/uL (ref 4.0–10.5)

## 2020-10-18 LAB — COMPREHENSIVE METABOLIC PANEL
ALT: 22 U/L (ref 0–53)
AST: 20 U/L (ref 0–37)
Albumin: 4.2 g/dL (ref 3.5–5.2)
Alkaline Phosphatase: 61 U/L (ref 39–117)
BUN: 16 mg/dL (ref 6–23)
CO2: 27 mEq/L (ref 19–32)
Calcium: 9.3 mg/dL (ref 8.4–10.5)
Chloride: 103 mEq/L (ref 96–112)
Creatinine, Ser: 1.1 mg/dL (ref 0.40–1.50)
GFR: 65.67 mL/min (ref >60.00)
Glucose, Bld: 110 mg/dL — ABNORMAL HIGH (ref 70–99)
Potassium: 3.6 mEq/L (ref 3.5–5.1)
Sodium: 139 mEq/L (ref 135–145)
Total Bilirubin: 0.8 mg/dL (ref 0.2–1.2)
Total Protein: 6.4 g/dL (ref 6.0–8.3)

## 2020-10-18 LAB — LIPID PANEL
Cholesterol: 179 mg/dL (ref 0–200)
HDL: 50.7 mg/dL (ref >39.00)
LDL Cholesterol: 113 mg/dL — ABNORMAL HIGH (ref 0–99)
NonHDL: 128.48
Total CHOL/HDL Ratio: 4
Triglycerides: 79 mg/dL (ref 0.0–149.0)
VLDL: 15.8 mg/dL (ref 0.0–40.0)

## 2020-10-18 LAB — URINALYSIS
Bilirubin Urine: NEGATIVE
Hgb urine dipstick: NEGATIVE
Ketones, ur: NEGATIVE
Leukocytes,Ua: NEGATIVE
Nitrite: NEGATIVE
Specific Gravity, Urine: 1.02 (ref 1.000–1.030)
Urine Glucose: NEGATIVE
Urobilinogen, UA: 0.2 (ref 0.0–1.0)
pH: 6.5 (ref 5.0–8.0)

## 2020-10-18 LAB — PSA: PSA: 0 ng/mL — ABNORMAL LOW (ref 0.10–4.00)

## 2020-10-18 LAB — TSH: TSH: 2.15 u[IU]/mL (ref 0.35–4.50)

## 2020-11-21 ENCOUNTER — Other Ambulatory Visit: Payer: Self-pay

## 2020-11-21 ENCOUNTER — Ambulatory Visit (AMBULATORY_SURGERY_CENTER): Payer: Self-pay

## 2020-11-21 VITALS — Ht 68.0 in | Wt 175.0 lb

## 2020-11-21 DIAGNOSIS — Z86010 Personal history of colonic polyps: Secondary | ICD-10-CM

## 2020-11-21 DIAGNOSIS — K921 Melena: Secondary | ICD-10-CM

## 2020-11-21 DIAGNOSIS — Z01818 Encounter for other preprocedural examination: Secondary | ICD-10-CM

## 2020-11-21 MED ORDER — NA SULFATE-K SULFATE-MG SULF 17.5-3.13-1.6 GM/177ML PO SOLN: 1.0000 | Freq: Once | ORAL | 0 refills | Status: AC

## 2020-11-21 NOTE — Progress Notes (Addendum)
No egg or soy allergy known to patient  No issues with past sedation with any surgeries or procedures No intubation problems in the past  No FH of Malignant Hyperthermia No diet pills per patient No home 02 use per patient  No blood thinners per patient  Pt denies issues with constipation  No A fib or A flutter  EMMI video to pt or via MyChart  COVID 19 guidelines implemented in PV today with Pt and RN   Covid test ordered 1/3 10 am   Due to the COVID-19 pandemic we are asking patients to follow certain guidelines.  Pt aware of COVID protocols and [facility] guidelines

## 2020-12-04 ENCOUNTER — Telehealth: Payer: Self-pay | Admitting: *Deleted

## 2020-12-04 NOTE — Telephone Encounter (Signed)
Reminder call made by Cathren Harsh- pt states that he was called and told him he was covid positive.  Pt was scheduled for his pre procedure covid test today at 10:00 am.  I called [facility] and they state they do not have results yet at all for his covid test.  I attempted to reach the pt to clarify who called him and to make sure he did go for his covid test and to apologize if there was some miscommunication.  Unable to reach so LMOM to call back as soon as possible

## 2020-12-04 NOTE — Telephone Encounter (Signed)
Pt states he went to the [facility] testing center on 12-01-20- he was called this am and was told he was covid positive.  I offered to reschedule his colonoscopy for 14 days past positive test and he states he will call back to schedule.

## 2020-12-06 ENCOUNTER — Encounter: Payer: Medicare PPO | Admitting: Gastroenterology

## 2020-12-18 ENCOUNTER — Ambulatory Visit: Payer: Medicare PPO | Admitting: Internal Medicine

## 2020-12-19 ENCOUNTER — Ambulatory Visit: Payer: Medicare PPO | Admitting: Cardiology

## 2020-12-28 ENCOUNTER — Telehealth: Payer: Self-pay | Admitting: Internal Medicine

## 2020-12-28 MED ORDER — HYDROXYCHLOROQUINE SULFATE 200 MG PO TABS
ORAL_TABLET | ORAL | 0 refills | Status: DC
Start: 2020-12-28 — End: 2021-03-19

## 2020-12-28 NOTE — Telephone Encounter (Signed)
   Patient requesting order for hydroxychloroquine (PLAQUENIL) 200 MG tablet [place], [place]  Patient states he doesn't have any symptoms at this time, requesting medication in the event he needs it

## 2020-12-28 NOTE — Telephone Encounter (Signed)
Ok Thx 

## 2021-01-16 ENCOUNTER — Other Ambulatory Visit: Payer: Self-pay | Admitting: Internal Medicine

## 2021-01-24 ENCOUNTER — Ambulatory Visit: Payer: Medicare PPO | Admitting: Cardiology

## 2021-02-22 ENCOUNTER — Other Ambulatory Visit: Payer: Self-pay | Admitting: Cardiology

## 2021-02-26 ENCOUNTER — Ambulatory Visit (AMBULATORY_SURGERY_CENTER): Payer: Medicare PPO | Admitting: *Deleted

## 2021-02-26 ENCOUNTER — Other Ambulatory Visit: Payer: Self-pay

## 2021-02-26 VITALS — Ht 68.0 in | Wt 170.0 lb

## 2021-02-26 DIAGNOSIS — Z86010 Personal history of colonic polyps: Secondary | ICD-10-CM

## 2021-02-26 MED ORDER — SUPREP BOWEL PREP KIT 17.5-3.13-1.6 GM/177ML PO SOLN: 1.0000 | Freq: Once | ORAL | 0 refills | Status: AC

## 2021-02-26 NOTE — Progress Notes (Signed)
Pt verified name, DOB, address and insurance during PV today. Pt mailed instruction packet to included paper to complete and mail back to [facility] with addressed and stamped envelope, Emmi video, copy of consent form to read and not return, and instructions. PV completed over the phone. Pt encouraged to call with questions or issues.   No egg or soy allergy known to patient  No issues with past sedation with any surgeries or procedures Patient denies ever being told they had issues or difficulty with intubation  No FH of Malignant Hyperthermia No diet pills per patient No home 02 use per patient  No blood thinners per patient  Pt denies issues with constipation  No A fib or A flutter  EMMI video to pt or via [place] 19 guidelines implemented in PV today with Pt and RN  Pt is not vaccinated  for Covid  Referral states polyps- blood in stool- pt states no blood in the stool- he has seen blood on toilet paper only  - never mixed in the stool    Due to the COVID-19 pandemic we are asking patients to follow certain guidelines.  Pt aware of COVID protocols and [facility] guidelines

## 2021-03-05 ENCOUNTER — Ambulatory Visit: Payer: Medicare PPO | Admitting: Cardiology

## 2021-03-05 ENCOUNTER — Other Ambulatory Visit: Payer: Self-pay

## 2021-03-05 ENCOUNTER — Encounter: Payer: Self-pay | Admitting: Cardiology

## 2021-03-05 VITALS — BP 134/82 | HR 81 | Ht 68.0 in | Wt 171.0 lb

## 2021-03-05 DIAGNOSIS — Z7189 Other specified counseling: Secondary | ICD-10-CM | POA: Diagnosis not present

## 2021-03-05 DIAGNOSIS — Z955 Presence of coronary angioplasty implant and graft: Secondary | ICD-10-CM

## 2021-03-05 DIAGNOSIS — I2510 Atherosclerotic heart disease of native coronary artery without angina pectoris: Secondary | ICD-10-CM | POA: Diagnosis not present

## 2021-03-05 DIAGNOSIS — E7800 Pure hypercholesterolemia, unspecified: Secondary | ICD-10-CM

## 2021-03-05 DIAGNOSIS — I10 Essential (primary) hypertension: Secondary | ICD-10-CM

## 2021-03-05 MED ORDER — LABETALOL HCL 300 MG PO TABS: ORAL_TABLET | ORAL | 3 refills | Status: DC

## 2021-03-05 NOTE — Progress Notes (Signed)
Cardiology Office Note:    Date:  03/05/2021   ID:  Andrew Essex., DOB 1946/06/22, MRN [id number]  PCP:  [name], MD  Cardiologist:  [name], MD  Referring MD: [name], MD   CC: follow up  History of Present Illness:    Andrew Kassebaum. is a 75 y.o. male with a hx of CAD with remote stent who is seen for follow up. I initially met him 09/07/2019 as a new consult at the request of [name], [name], MD for the evaluation and management of CAD.  CV history: distal LAD stent, last cath Jan 18, 2011 Father died of MI age 68.  Cardiovascular risk factors: Prior clinical ASCVD: CAD with PCI to LAD. ?CVA in Jan 18, 2006 (quarter sized bleed in his brain--lightheadedness/dizziness), unclear ?seizure event 2011-01-18 Comorbid conditions: hypertension, hyperlipidemia. No diabetes, chronic kidney disease:  Metabolic syndrome/Obesity: BMI 27 Chronic inflammatory conditions: none Tobacco use history: never Family history: father died age 61 from MI, 3 ppd smoker, overweight. Mother died age 20 of stroke. Brother has diabetes. 3 children, all heathly Prior cardiac testing: cath 2010, 2012. Echo January 18, 2014. MPI 1997, 2000Feb 17, [id number] Exercise level: had been daily gym, up to 7 miles/10k steps, prior to Covid. Now walking only sporadically. No limitations.  Current diet: doesn't cook at home. Mostly chicken, fish, vegetables. Enjoys ice cream, pastry rarely.  Hypercholesterolemia: last LDL 124. Was told in the past his total cholesterol was over 400. Was happy that his total cholesterol is less than 200.  Today: Doing "wonderful." Takes labetalol (full pill), two aspirin, and vitamins in the morning. Takes labetalol (1/2 pill), irbesartan-hctz, rosuvastatin at bedtime. Discussed nocturia with the HCTZ. Notices some nocturia (1-2 times/night), discussed role of HCTZ in this. Doesn't bother him. Discussed making this an AM med but feels that his regimen is working.   Denies  chest pain, shortness of breath at rest or with normal exertion. No PND, orthopnea, LE edema or unexpected weight gain. No syncope or palpitations.  Fully retired as of 11/2020. Hasn't walked much in the winter, looking forward to getting outside. Weight has been stable, down 30 lbs since last summer, weighs 170 lbs on his home scale.   We reviewed his lipid numbers from 10/2020. We reviewed guidelines, goal of LDL <70, After shared decision making, will not adjust medications.  ROS positive for one incident of bilateral neck pain. Felt like a cramp, better when he massaged his neck. Asking as he had prior carotid studies January 18, 2014, bilateral 1-39% stenosis).   Can walk 30 min easily, climbs stairs 5-6 times/day. We discussed AHA guidelines for exercise as well as nutritional recommendations today.   Past Medical History:  Diagnosis Date  . Allergic rhinitis, cause unspecified   . Allergy    SEASONAL  . Anxiety   . Blood in stool   . BPH (benign prostatic hyperplasia)   . CAD (coronary artery disease)    DES mid lad 08/15/2009  . Cancer [facility]) 2008-01-19   prostate  . Chest pain, unspecified   . Colon polyps   . CVA (cerebrovascular accident due to intracerebral hemorrhage) [facility]) 2007 Spring  . History of prostate cancer Jan 19, 2008   Dr [name]  . HTN (hypertension)   . Hypercholesterolemia   . Hyperlipidemia   . Hypokalemia   . Insomnia, persistent   . Neoplasm of uncertain behavior of skin   . Stroke [facility])    January 18, 2006  . Syncope   . Tubular adenoma of  colon 2010    Past Surgical History:  Procedure Laterality Date  . ANGIOPLASTY     with stents  . COLONOSCOPY  2016   Larrie Kass  . POLYPECTOMY    . PROSTATECTOMY  2009  . TONSILLECTOMY      Current Medications: Current Outpatient Medications on File Prior to Visit  Medication Sig  . aspirin EC 81 MG tablet Take 1 tablet (81 mg total) by mouth daily. (Patient taking differently: Take 162 mg by mouth daily.)  . Cholecalciferol (VITAMIN  D3) 2000 UNITS capsule Take 1 capsule (2,000 Units total) by mouth daily. (Patient taking differently: Take 2,000 Units by mouth daily. This changes daily the amount - usually 3000 to 5000 units)  . desloratadine (CLARINEX) 5 MG tablet Take 1 tablet (5 mg total) by mouth daily.  . hydroxychloroquine (PLAQUENIL) 200 MG tablet Take 400 mg bid x 2 days, then 200 mg daily for 5 dais if fever, cough, shortness of breath  . irbesartan-hydrochlorothiazide (AVALIDE) 150-12.5 MG tablet TAKE 1 TABLET BY MOUTH DAILY  . rosuvastatin (CRESTOR) 20 MG tablet TAKE 1 TABLET BY MOUTH DAILY  . triamcinolone cream (KENALOG) 0.5 % Use tid prn  . zolpidem (AMBIEN) 10 MG tablet Take 1 tablet at night as directed   No current facility-administered medications on file prior to visit.     Allergies:   Amlodipine, Iodine, and Shellfish allergy   Social History   Tobacco Use  . Smoking status: Never Smoker  . Smokeless tobacco: Never Used  Vaping Use  . Vaping Use: Never used  Substance Use Topics  . Alcohol use: Yes    Comment: glass of wine- occ  per pt - occ mixed drink   . Drug use: No    Family History: family history includes Diabetes in his brother; Heart disease in his father; Stroke in his mother. There is no history of Colon polyps, Esophageal cancer, Rectal cancer, Stomach cancer, or Colon cancer.  ROS:   Please see the history of present illness.  Additional pertinent ROS otherwise unremarkable.   EKGs/Labs/Other Studies Reviewed:    The following studies were reviewed today: Cath 07/31/2011 Left main coronary artery was a very short segment.  There was near side- by-side separate ostia.  No significant disease.  The proximal LAD had 30% to 40% tubular disease.  The mid LAD had 50% multiple discrete lesions.  This was unchanged from the previous catheterization done in 2010.  There was a widely patent stent in the distal LAD.  The LAD was a large artery that wrapped the apex.  The LAD  actually supplied most of the PDA territory, so it was difficult to know what dominance the patient was.  Right coronary artery was small and had no significant disease.  The circumflex coronary artery was somewhat larger than right coronary artery, but again the PDA was mostly supplied by the distal LAD.  There was 20% to 30% ostial lesions proximally.  The ostium of the first obtuse marginal branch had a 40% lesion.  The patient also had a very large diagonal branch, which did not have significant disease and provided most of the anterolateral wall.  Left ventriculography.  Left ventriculography was normal.  EF was 60%. There was no gradient across the aortic valve and no MR.  Aortic pressure was 132/71.  LV pressure was somewhat inaccurate with the LV tracing at 160/11.  IMPRESSION:  The patient has continued moderate disease in the mid LAD after the diagonal takeoff.  I closely reviewed his previous film from 2010 and there has been no change in this disease.  His stent is widely patent and he does not have any new disease in the right coronary artery or circ.  Clinically when I talked to the patient it sounded like he had a seizure.  There is no antecedent chest pain, palpitations, or cardiac symptoms.  The patient was fairly insistent on having a diagnostic cath rather than a stress test.  I have encouraged the patient to follow up with Neurology.  We have already arranged for him to have an EEG, MRI with MRA, and neurological followup.  Given the patency of the stent, normal LV function would be highly unlikely that his "seizure event was cardiac in etiology."  At the end of the case, a TR band was placed with good hemostasis.  He tolerated the procedure well.  Cath done by Dr. Olevia Perches. During the procedure, he had a contrast reaction to the dye (reported prior iodine/shellfish allergy), was treated for this. Received PCI to LAD with stenosis form 95% to  0%.   EKG:  EKG is personally reviewed.  The ekg ordered 09/22/20 demonstrates sinus bradycardia, LAFB at 58 bpm  Recent Labs: 10/18/2020: ALT 22; BUN 16; Creatinine, Ser 1.10; Hemoglobin 14.8; Platelets 125.0; Potassium 3.6; Sodium 139; TSH 2.15  Recent Lipid Panel    Component Value Date/Time   CHOL 179 10/18/2020 0949   TRIG 79.0 10/18/2020 0949   HDL 50.70 10/18/2020 0949   CHOLHDL 4 10/18/2020 0949   VLDL 15.8 10/18/2020 0949   LDLCALC 113 (H) 10/18/2020 0949   LDLDIRECT 296.1 08/12/2013 0936    Physical Exam:    VS:  BP 134/82   Pulse 81   Ht _0  (1.727 m)   Wt 171 lb (77.6 kg)   SpO2 96%   BMI 26.00 kg/m   Recheck 170/78  Wt Readings from Last 3 Encounters:  03/05/21 171 lb (77.6 kg)  02/26/21 170 lb (77.1 kg)  11/21/20 175 lb (79.4 kg)    GEN: Well nourished, well developed in no acute distress HEENT: Normal, moist mucous membranes NECK: No JVD. Bilateral SCM muscle tense and tender on exam CARDIAC: regular rhythm, normal S1 and S2, no rubs or gallops. No murmur. VASCULAR: Radial and DP pulses 2+ bilaterally. No carotid bruits RESPIRATORY:  Clear to auscultation without rales, wheezing or rhonchi  ABDOMEN: Soft, non-tender, non-distended MUSCULOSKELETAL:  Ambulates independently SKIN: Warm and dry, no edema NEUROLOGIC:  Alert and oriented x 3. No focal neuro deficits noted. PSYCHIATRIC:  Normal affect   ASSESSMENT:    1. Coronary artery disease involving native coronary artery of native heart without angina pectoris   2. History of coronary angioplasty with insertion of stent   3. Essential hypertension   4. Pure hypercholesterolemia   5. Cardiac risk counseling   6. Counseling on health promotion and disease prevention    PLAN:    CAD with history of prior stent: asymptomatic. Reviewed guidelines for management -continue aspirin 81 mg, no issues with bleeding -continue rosuvastatin 20 mg daily -asymptomatic, exercising routinely. Discussed  guidelines today -discussed LDL goal, see below -reviewed red flag warning signs that need immediate medical attention  Hypertension: much improved from prior. Overall goal <130/80 -continue irbesartan-HCTZ 150-12.5 mg daily. We discussed that taking this at night may exacerbate nocturia, but he feels this is manageable and current regimen is working well -continue labetalol, has been on long term. Adjusted dosing today based on how  he is actually taking the medication. -has been on amlodipine in the past if additional agent needed in the future  Hypercholesterolemia: LDL goal <70 -reviewed lipids from 10/2020. LDL 113. Discussed guidelines/recommendations for LDL <70. Discussed options today. After shared decision making, will not uptitrate or adjust regimen today. -continue rosuvastatin 20 mg daily  Cardiac risk counseling and prevention recommendations: -recommend heart healthy/Mediterranean diet, with whole grains, fruits, vegetable, fish, lean meats, nuts, and olive oil. Limit salt. -recommend moderate walking, 3-5 times/week for 30-50 minutes each session. Aim for at least 150 minutes.week. Goal should be pace of 3 miles/hours, or walking 1.5 miles in 30 minutes -recommend avoidance of tobacco products. Avoid excess alcohol.  Plan for follow up: 1 year or sooner as needed  Total time of encounter: 48 minutes total time of encounter, including 43 minutes spent in face-to-face patient care. This time includes coordination of care and counseling regarding cv guidelines, risk factor management. Remainder of non-face-to-face time involved reviewing chart documents/testing relevant to the patient encounter and documentation in the medical record.  [name], MD, PhD, [facility]   Medication Adjustments/Labs and Tests Ordered: Current medicines are reviewed at length with the patient today.  Concerns regarding medicines are outlined above.  No orders of  the defined types were placed in this encounter.  Meds ordered this encounter  Medications  . labetalol (NORMODYNE) 300 MG tablet    Sig: Take 1 tablet (300 mg total) by mouth in the morning AND 0.5 tablets (150 mg total) every evening.    Dispense:  135 tablet    Refill:  3    Patient Instructions  Medication Instructions:  Your Physician recommend you continue on your current medication as directed.    *If you need a refill on your cardiac medications before your next appointment, please call your pharmacy*   Lab Work: None   Testing/Procedures: None   Follow-Up: At [facility], you and your health needs are our priority.  As part of our continuing mission to provide you with exceptional heart care, we have created designated Provider Care Teams.  These Care Teams include your primary Cardiologist (physician) and Advanced Practice Providers (APPs -  Physician Assistants and Nurse Practitioners) who all work together to provide you with the care you need, when you need it.  We recommend signing up for the patient portal called "MyChart".  Sign up information is provided on this After Visit Summary.  MyChart is used to connect with patients for Virtual Visits (Telemedicine).  Patients are able to view lab/test results, encounter notes, upcoming appointments, etc.  Non-urgent messages can be sent to your provider as well.   To learn more about what you can do with MyChart, go to [email or web address].    Your next appointment:   1 year(s) @ [address] [place], [place] [address]   The format for your next appointment:   In Person  Provider:   [name], MD       Signed, [name], MD PhD 03/05/2021    [facility]

## 2021-03-05 NOTE — Patient Instructions (Signed)
Medication Instructions:  Your Physician recommend you continue on your current medication as directed.    *If you need a refill on your cardiac medications before your next appointment, please call your pharmacy*   Lab Work: None   Testing/Procedures: None   Follow-Up: At [facility], you and your health needs are our priority.  As part of our continuing mission to provide you with exceptional heart care, we have created designated Provider Care Teams.  These Care Teams include your primary Cardiologist (physician) and Advanced Practice Providers (APPs -  Physician Assistants and Nurse Practitioners) who all work together to provide you with the care you need, when you need it.  We recommend signing up for the patient portal called "MyChart".  Sign up information is provided on this After Visit Summary.  MyChart is used to connect with patients for Virtual Visits (Telemedicine).  Patients are able to view lab/test results, encounter notes, upcoming appointments, etc.  Non-urgent messages can be sent to your provider as well.   To learn more about what you can do with MyChart, go to [email or web address].    Your next appointment:   1 year(s) @ [address] [place], [place] [address]   The format for your next appointment:   In Person  Provider:   [name], MD

## 2021-03-06 ENCOUNTER — Encounter: Payer: Self-pay | Admitting: Gastroenterology

## 2021-03-07 ENCOUNTER — Encounter: Payer: Self-pay | Admitting: Gastroenterology

## 2021-03-07 ENCOUNTER — Ambulatory Visit: Payer: Medicare PPO | Admitting: Gastroenterology

## 2021-03-07 ENCOUNTER — Telehealth: Payer: Self-pay | Admitting: Cardiology

## 2021-03-07 ENCOUNTER — Other Ambulatory Visit: Payer: Self-pay

## 2021-03-07 VITALS — BP 167/102 | HR 133 | Temp 97.7°F | Ht 68.0 in | Wt 170.0 lb

## 2021-03-07 MED ORDER — SODIUM CHLORIDE 0.9 % IV SOLN: 500.0000 mL | Freq: Once | INTRAVENOUS | Status: DC

## 2021-03-07 NOTE — Telephone Encounter (Signed)
Contacted by Dr. Fuller Plan with GI. DOD phone call. Patient had presented for his scheduled endoscopy. On vitals check, heart rate was in the 140s. ECG performed (I cannot see), reported as atrial fibrillation with rates in the 140s. Patient is hemodynamically stable and asymptomatic. He is still in endoscopy.  Given that he is asymptomatic, stable, and still in endoscopy, unable to get him urgent office visit today. We will work to get him an urgent visit, ideally tomorrow. Will need a copy of ECG for review.   Gave instructions that if he has any symptoms of chest pain, shortness of breath, dizziness/lightheadedness or near syncope, he should seek emergency medical attention overnight.  [name], MD, PhD, [facility]  [address], [place], [place] [address] [phone]

## 2021-03-07 NOTE — Progress Notes (Signed)
Upon obtaining admission vital signs RN noted that pt's BP was 167/102 on left arm and 179/111 on right arm. HR was fluctuating from 133-150's. Pt is very anxious. States "I'm just ready to get this done. It has been very stressful for me." RN questioned if pt had a history of Afib. He denies any history of an irregular HR. Reports he takes Labetalol BID for HTN only. Pt denies chest pain and SOB. Dr. Fuller Plan and CRNA made aware. Orders received to obtain an EKG. EKG completed and revealed Atrial fibrillation with RVR, rate 148. MD made aware. Pt had evaluation with his Cardiologist on 03/05/21 and that note was reviewed.  Procedure cancelled for today d/t new onset Afib with RVR. MD spoke with pt and instructed him that procedure could not be done today due to these findings and instructed pt that he needed to go the ER today to be evaluated. Pt requested Dr. Fuller Plan to speak to his Cardiologist and see what she recommends. Dr. Fuller Plan spoke with Cardiologist, Dr. [name], regarding pt's new onset Afib. She stated to Dr. Fuller Plan that since the pt denied chest pain, SOB or light-headedness and BP not low that she felt comfortable with him going home today and that her office would call him in the morning to arrange Cardiology follow up. Pt instructed that if he became symptomatic to seek medical care immediately.

## 2021-03-07 NOTE — Progress Notes (Signed)
VS-JD  Pt's states no medical or surgical changes since previsit or office visit.

## 2021-03-08 ENCOUNTER — Ambulatory Visit (INDEPENDENT_AMBULATORY_CARE_PROVIDER_SITE_OTHER): Payer: Medicare PPO | Admitting: Internal Medicine

## 2021-03-08 VITALS — BP 144/80 | HR 91 | Ht 68.0 in | Wt 172.0 lb

## 2021-03-08 DIAGNOSIS — I10 Essential (primary) hypertension: Secondary | ICD-10-CM | POA: Diagnosis not present

## 2021-03-08 DIAGNOSIS — I2510 Atherosclerotic heart disease of native coronary artery without angina pectoris: Secondary | ICD-10-CM | POA: Diagnosis not present

## 2021-03-08 DIAGNOSIS — Z955 Presence of coronary angioplasty implant and graft: Secondary | ICD-10-CM | POA: Diagnosis not present

## 2021-03-08 DIAGNOSIS — I4891 Unspecified atrial fibrillation: Secondary | ICD-10-CM

## 2021-03-08 DIAGNOSIS — I484 Atypical atrial flutter: Secondary | ICD-10-CM

## 2021-03-08 DIAGNOSIS — Z7189 Other specified counseling: Secondary | ICD-10-CM

## 2021-03-08 DIAGNOSIS — E7800 Pure hypercholesterolemia, unspecified: Secondary | ICD-10-CM | POA: Diagnosis not present

## 2021-03-08 NOTE — Progress Notes (Signed)
Cardiology Office Note:    Date:  03/08/2021   ID:  Andrew Joseph., DOB 1946/08/14, MRN [id number]  PCP:  [name], MD  Cardiologist:  [name], MD  Electrophysiologist:  None   Referring MD: [name], MD   Chief Complaint/Reason for Referral: Afib RVR  Acute care visit requested on behalf of patient's primary cardiologist Dr. Harrell Gave.  History of Present Illness:    Andrew Joseph. is a 75 y.o. male with a history of coronary artery disease with remote stents, with documentation of distal LAD stent and last cardiac catheterization in 2012.  He has a family history of premature CAD.  Dr. Judeth Cornfield notes document a probable CVA in 2007.  History otherwise includes hypertension, hyperlipidemia.  Andrew Joseph presents today after new diagnosis of atrial fibrillation noted in endoscopy yesterday.  Dr. Fuller Plan contacted Dr. Harrell Gave when the patient was noted to have heart rate in the 140s with ECG documenting atrial fibrillation.  Patient brings a copy of these ECGs to review today.  ECG obtained on March 07, 2021 at 2:44 PM computer interpretation suggests atrial fibrillation with rapid ventricular response.  On my review, this ECG may represent atypical atrial flutter with a flutter wave best seen in lead V1.  There is variable conduction.  Andrew Joseph and I reviewed 3 of the main principles in the management of atrial fibrillation including rate control, rhythm control strategies, and stroke risk prevention with anticoagulation.  We discussed in detail that today he appears well rate controlled.  I have offered him transition off of labetalol to a longer acting beta-blocker or could consider diltiazem, the patient is comfortable with his prescription at the moment and does not want to change today, particularly since rate control is adequate.  He feels he is asymptomatic in atrial flutter.  We also reviewed rhythm control strategies including  expectant management with possible paroxysmal atrial arrhythmia, TEE cardioversion, or awaiting 3 to 4 weeks of full anticoagulation and proceeding with DCCV alone.  We spent the majority of the visit discussing anticoagulation indications, risks including bleeding, and discussing the CHA2DS2-VASc.  Patient's CHA2DS2-VASc score is 6 as noted below.  He denies current chest pain, chest pressure, shortness of breath, palpitations, PND, orthopnea, leg swelling.  Denies syncope or presyncope, denies dizziness or lightheadedness.  This patients CHA2DS2-VASc Score and unadjusted Ischemic Stroke Rate (% per year) is equal to 9.7 % stroke rate/year from a score of 6 Above score calculated as 1 point each if present [CHF, HTN, DM, Vascular=MI/PAD/Aortic Plaque, Age if 65-74, or Male] Above score calculated as 2 points each if present [Age > 75, or Stroke/TIA/TE]   Past Medical History:  Diagnosis Date  . Allergic rhinitis, cause unspecified   . Allergy    SEASONAL  . Anxiety   . Blood in stool   . BPH (benign prostatic hyperplasia)   . CAD (coronary artery disease)    DES mid lad 08/15/2009  . Cancer [facility]) 2009   prostate  . Chest pain, unspecified   . Colon polyps   . CVA (cerebrovascular accident due to intracerebral hemorrhage) [facility]) 2007 [place]  . History of prostate cancer 2009   Dr [name]  . HTN (hypertension)   . Hypercholesterolemia   . Hyperlipidemia   . Hypokalemia   . Insomnia, persistent   . Neoplasm of uncertain behavior of skin   . Stroke [facility])    2007  . Syncope   . Tubular adenoma of colon  2010    Past Surgical History:  Procedure Laterality Date  . ANGIOPLASTY     with stents  . COLONOSCOPY  2016   Larrie Kass  . POLYPECTOMY    . PROSTATECTOMY  2009  . TONSILLECTOMY      Current Medications: Current Meds  Medication Sig  . aspirin EC 81 MG tablet Take 1 tablet (81 mg total) by mouth daily. (Patient taking differently: Take 162 mg by mouth daily.)  .  Cholecalciferol (VITAMIN D3) 2000 UNITS capsule Take 1 capsule (2,000 Units total) by mouth daily. (Patient taking differently: Take 2,000 Units by mouth daily. This changes daily the amount - usually 3000 to 5000 units)  . desloratadine (CLARINEX) 5 MG tablet Take 1 tablet (5 mg total) by mouth daily.  . hydroxychloroquine (PLAQUENIL) 200 MG tablet Take 400 mg bid x 2 days, then 200 mg daily for 5 dais if fever, cough, shortness of breath  . irbesartan-hydrochlorothiazide (AVALIDE) 150-12.5 MG tablet TAKE 1 TABLET BY MOUTH DAILY  . labetalol (NORMODYNE) 300 MG tablet Take 1 tablet (300 mg total) by mouth in the morning AND 0.5 tablets (150 mg total) every evening.  . rosuvastatin (CRESTOR) 20 MG tablet TAKE 1 TABLET BY MOUTH DAILY  . triamcinolone cream (KENALOG) 0.5 % Use tid prn  . zolpidem (AMBIEN) 10 MG tablet Take 1 tablet at night as directed     Allergies:   Amlodipine, Iodine, and Shellfish allergy   Social History   Tobacco Use  . Smoking status: Never Smoker  . Smokeless tobacco: Never Used  Vaping Use  . Vaping Use: Never used  Substance Use Topics  . Alcohol use: Yes    Comment: glass of wine- occ  per pt - occ mixed drink   . Drug use: No     Family History: The patient's family history includes Diabetes in his brother; Heart disease in his father; Stroke in his mother. There is no history of Colon polyps, Esophageal cancer, Rectal cancer, Stomach cancer, or Colon cancer.  ROS:   Please see the history of present illness.    All other systems reviewed and are negative.  EKGs/Labs/Other Studies Reviewed:    The following studies were reviewed today:  EKG:  Atrial flutter, variable AV block, appears atypical atrial flutter   Recent Labs: 10/18/2020: ALT 22; BUN 16; Creatinine, Ser 1.10; Hemoglobin 14.8; Platelets 125.0; Potassium 3.6; Sodium 139; TSH 2.15  Recent Lipid Panel    Component Value Date/Time   CHOL 179 10/18/2020 0949   TRIG 79.0 10/18/2020 0949    HDL 50.70 10/18/2020 0949   CHOLHDL 4 10/18/2020 0949   VLDL 15.8 10/18/2020 0949   LDLCALC 113 (H) 10/18/2020 0949   LDLDIRECT 296.1 08/12/2013 0936    Physical Exam:    VS:  BP (!) 144/80 (BP Location: Right Arm, Patient Position: Sitting, Cuff Size: Normal)   Pulse 91   Ht 5\' 8"  (1.727 m)   Wt 172 lb (78 kg)   BMI 26.15 kg/m     Wt Readings from Last 5 Encounters:  03/08/21 172 lb (78 kg)  03/07/21 170 lb (77.1 kg)  03/05/21 171 lb (77.6 kg)  02/26/21 170 lb (77.1 kg)  11/21/20 175 lb (79.4 kg)    Constitutional: No acute distress Eyes: sclera non-icteric, normal conjunctiva and lids ENMT: normal dentition, moist mucous membranes Cardiovascular: regular rhythm, normal rate, no murmurs. S1 and S2 normal. Radial pulses normal bilaterally. No jugular venous distention.  Respiratory: clear to auscultation bilaterally  GI : normal bowel sounds, soft and nontender. No distention.   MSK: extremities warm, well perfused. No edema.  NEURO: grossly nonfocal exam, moves all extremities. PSYCH: alert and oriented x 3, normal mood and affect.   ASSESSMENT:    1. Atypical atrial flutter ([place])   2. Coronary artery disease involving native coronary artery of native heart without angina pectoris   3. History of coronary angioplasty with insertion of stent   4. Essential hypertension   5. Pure hypercholesterolemia   6. Cardiac risk counseling    PLAN:    Atypical atrial flutter ([place]) - Plan: EKG 12-Lead  -The entirety of our visit was spent discussing his presentation with atypical atrial flutter in endoscopy yesterday as a result of stressful experience with bowel prep and anticipation of GI procedure.  I have strongly recommended to the patient that he begin anticoagulation with Eliquis 5 mg twice daily for stroke risk reduction.  He would like to consider this and not rush into a decision, and would like to discuss this further with Dr. Harrell Gave at their next appointment.   Earliest appointment I was able to secure is early May.  I have encouraged the patient to contact our office if he would like to begin Eliquis prior to awaiting this visit with his primary cardiologist.  I have also recommended an alternate rate control strategy since labetalol is short acting, he would like to continue on labetalol since he feels well and rates are adequately controlled today.  I suspect if the underlying rhythm is atrial flutter as it appears to be on both endoscopy ECGs as well as her ECG today he may need restoration of sinus rhythm with the assistance of cardioversion.  I would be happy to help arrange a TEE cardioversion or cardioversion for the patient if he calls and prior to his visit with Dr. Harrell Gave.  I have alerted him to heart failure symptoms to be on the look out for given the loss of atrial kick with atrial arrhythmias.  We were unable to discuss this at our visit due to time, but I would also recommend a repeat echocardiogram.  His last echocardiogram was in 2015 with an ejection fraction of 50 to 55%, and mildly dilated LA.  I suspect with history of hypertension his left atrial size may have changed and this may be the nidus for continued atrial flutter.   Total time of encounter: 60 minutes total time of encounter, including 50 minutes spent in face-to-face patient care on the date of this encounter. This time includes coordination of care and counseling regarding above mentioned problem list. Remainder of non-face-to-face time involved reviewing chart documents/testing relevant to the patient encounter and documentation in the medical record. I have independently reviewed documentation from referring provider.   Cherlynn Kaiser, MD, [facility]    Medication Adjustments/Labs and Tests Ordered: Current medicines are reviewed at length with the patient today.  Concerns regarding medicines are outlined above.   Orders Placed This Encounter   Procedures  . EKG 12-Lead    No orders of the defined types were placed in this encounter.   Patient Instructions  Medication Instructions:  No Changes In Medications at this time.  *If you need a refill on your cardiac medications before your next appointment, please call your pharmacy*  Follow-Up: At [facility], you and your health needs are our priority.  As part of our continuing mission to provide you with exceptional heart care, we  have created designated Provider Care Teams.  These Care Teams include your primary Cardiologist (physician) and Advanced Practice Providers (APPs -  Physician Assistants and Nurse Practitioners) who all work together to provide you with the care you need, when you need it.  We recommend signing up for the patient portal called "MyChart".  Sign up information is provided on this After Visit Summary.  MyChart is used to connect with patients for Virtual Visits (Telemedicine).  Patients are able to view lab/test results, encounter notes, upcoming appointments, etc.  Non-urgent messages can be sent to your provider as well.   To learn more about what you can do with MyChart, go to [email or web address].    Your next appointment:   MAY 2nd 2:20PM- IF YOU HAVE ANY ISSUES PLEASE CALL us SO THAT WE CAN WORK YOU IN.   The format for your next appointment:   In Person  Provider:   [name], MD

## 2021-03-08 NOTE — Patient Instructions (Signed)
Medication Instructions:  No Changes In Medications at this time.  *If you need a refill on your cardiac medications before your next appointment, please call your pharmacy*  Follow-Up: At [facility], you and your health needs are our priority.  As part of our continuing mission to provide you with exceptional heart care, we have created designated Provider Care Teams.  These Care Teams include your primary Cardiologist (physician) and Advanced Practice Providers (APPs -  Physician Assistants and Nurse Practitioners) who all work together to provide you with the care you need, when you need it.  We recommend signing up for the patient portal called "MyChart".  Sign up information is provided on this After Visit Summary.  MyChart is used to connect with patients for Virtual Visits (Telemedicine).  Patients are able to view lab/test results, encounter notes, upcoming appointments, etc.  Non-urgent messages can be sent to your provider as well.   To learn more about what you can do with MyChart, go to [email or web address].    Your next appointment:   MAY 2nd 2:20PM- IF YOU HAVE ANY ISSUES PLEASE CALL us SO THAT WE CAN WORK YOU IN.   The format for your next appointment:   In Person  Provider:   [name], MD

## 2021-03-12 ENCOUNTER — Telehealth: Payer: Self-pay | Admitting: Cardiology

## 2021-03-12 DIAGNOSIS — I484 Atypical atrial flutter: Secondary | ICD-10-CM

## 2021-03-12 DIAGNOSIS — I2510 Atherosclerotic heart disease of native coronary artery without angina pectoris: Secondary | ICD-10-CM

## 2021-03-12 NOTE — Telephone Encounter (Signed)
Pt called to repot that his BP has been elevated ever since he had his colonoscopy prep which he was told had high sodium in it (03/07/21)...he says his BP the past few days have been at random:  145/80 150/85 164/99 Today 167/104 and after recheck 158/99  HR consistent in the 80's.   He reports that he has been very anxious about his BP and feels it could be contributing to his readings. He says that he is asymptomatic. He feels well, no SOB, dizziness, headache, no chest pain.   Pt asked to check his BP twice daily after taking his meds after resting for about 10-15 min with both feet in the ground.   He does not watch his NA intake... he eats out daily but tries to eat fish and choose healthy but not sure how much NA is in his food at the restaurants.   No edema in his lower extremities.   Pt will monitor, keep his 03/30/21 OV with Dr. Harrell Gave.   I will forward to Dr. Harrell Gave for review and if she would like to make any med changes prior to his appt.

## 2021-03-12 NOTE — Telephone Encounter (Signed)
Pt c/o BP issue: STAT if pt c/o blurred vision, one-sided weakness or slurred speech  1. What are your last 5 BP readings?  04/11: 158/99 He states his HR has been in the 90's  2. Are you having any other symptoms (ex. Dizziness, headache, blurred vision, passed out)? No, patient states he is asymptomatic   3. What is your BP issue?  Patient states his BP has been elevated.

## 2021-03-13 ENCOUNTER — Telehealth: Payer: Self-pay

## 2021-03-13 MED ORDER — APIXABAN 5 MG PO TABS
5.0000 mg | ORAL_TABLET | Freq: Two times a day (BID) | ORAL | 3 refills | Status: DC
Start: 2021-03-13 — End: 2021-08-21

## 2021-03-13 MED ORDER — DILTIAZEM HCL ER COATED BEADS 120 MG PO CP24: 120.0000 mg | ORAL_CAPSULE | Freq: Every day | ORAL | 3 refills | Status: DC

## 2021-03-13 NOTE — Telephone Encounter (Signed)
Spoke to patient Dr.Acharya advised to start Diltiazem 120 mg daily.Advised to start Eliquis 5 mg twice a day.Stop Aspirin.Advised will need a repeat echo.Scheduler will call back with echo appointment.Advised to keep appointment already scheduled with Dr.Christopher 4/29 at 9:20 am.Advised continue to monitor B/P and bring readings to appointment.

## 2021-03-13 NOTE — Telephone Encounter (Signed)
Received a call from patient.Stated his B/P continues to be elevated this morning B/P 180/100 pulse 95.Stated he saw Dr.Acharya last week and  has decided he wants to change medications to what she had recommended.Stated he does not feel like he needs to wait until his appointment scheduled with Dr.Christopher 03/30/21.Advised I will speak to Dr.Acharya and call back.

## 2021-03-13 NOTE — Telephone Encounter (Signed)
Spoke to patient Dr.Acharya advised to continue aspirin with eliquis until you see Dr.Christopher 03/30/21 at 9:20 am.Advised to discuss with Dr.Christopher.

## 2021-03-14 ENCOUNTER — Telehealth: Payer: Self-pay | Admitting: Cardiology

## 2021-03-14 NOTE — Telephone Encounter (Addendum)
Return call to pt regarding recent message. Pt informed that he saw Dr. Margaretann Loveless on a DOD day and Dr. Harrell Gave is his primary cardiologist. Pt also informed for appointments in the interim if Dr. Harrell Gave has not availability , we schedule pt's with APP or DOD for urgent matters. Pt voiced that he does not want to be seen by an APP and requesting to only see a MD. Pt informed that first available is no until mid May to see a doctor and also offered to reach out to Afib clinic for a sooner appointment, but pt continue to refuse and state he would prefer to only be seen in our office.   Upper management made aware and will contact pt.

## 2021-03-14 NOTE — Telephone Encounter (Signed)
See phone note from 03/12/21. I called patient to reschedule appt on 04/29 with Dr. Harrell Gave due to a family emergency. Patient did not want to schedule out further due to BP and following up on his Afib. He begged to be seen sooner. I don't see anything available. He has requested to see Dr. Margaretann Loveless and I have messaged Eliezer Lofts. She will talk to her to get approval. Patient denied seeing an APP.

## 2021-03-15 ENCOUNTER — Ambulatory Visit (INDEPENDENT_AMBULATORY_CARE_PROVIDER_SITE_OTHER): Payer: Medicare PPO | Admitting: Internal Medicine

## 2021-03-15 ENCOUNTER — Encounter: Payer: Self-pay | Admitting: Internal Medicine

## 2021-03-15 ENCOUNTER — Other Ambulatory Visit: Payer: Self-pay

## 2021-03-15 VITALS — BP 148/90 | HR 112 | Temp 97.9°F | Ht 68.0 in | Wt 172.2 lb

## 2021-03-15 DIAGNOSIS — I4891 Unspecified atrial fibrillation: Secondary | ICD-10-CM

## 2021-03-15 DIAGNOSIS — I10 Essential (primary) hypertension: Secondary | ICD-10-CM

## 2021-03-15 DIAGNOSIS — I4892 Unspecified atrial flutter: Secondary | ICD-10-CM | POA: Diagnosis not present

## 2021-03-15 DIAGNOSIS — F439 Reaction to severe stress, unspecified: Secondary | ICD-10-CM

## 2021-03-15 DIAGNOSIS — F5101 Primary insomnia: Secondary | ICD-10-CM | POA: Diagnosis not present

## 2021-03-15 MED ORDER — LORAZEPAM 0.5 MG PO TABS: 0.5000 mg | ORAL_TABLET | Freq: Two times a day (BID) | ORAL | 1 refills | Status: DC | PRN

## 2021-03-15 NOTE — Assessment & Plan Note (Addendum)
The patient is upset about his new diagnosis of atrial flutter/atrial fibrillation.  We discussed stress reduction.  We discussed negative effects of stress.  We will try lorazepam prn at low-dose.  Side effects discussed

## 2021-03-15 NOTE — Telephone Encounter (Addendum)
Spoke with patient and offered a referral to EP based on Dr. Judeth Cornfield recommendation. Patient isn't comfortable moving to that step without better understanding his current diagnosis. Received permission to schedule with Dr. Margaretann Loveless and offered him an appointment with her. Pt was very happy and appreciated the effort.   Dr. Margaretann Loveless called and informed me she is remote on the day I scheduled the patient. She gave permission for him to be scheduled on 4/25 @1540 . Contacted the patient and he was very happy to change as it is two days sooner. He was appreciative of all efforts made on his behalf.

## 2021-03-15 NOTE — Patient Instructions (Addendum)
Start Eliquis today Start Diltiazem in 1-2 days - take in AM

## 2021-03-15 NOTE — Progress Notes (Addendum)
Subjective:  Patient ID: Andrew Joseph., male    DOB: [birth year]  Age: 75 y.o. MRN: [id number]  CC: Hypertension (Pt states since he had his colonoscopy BP has been running high)   HPI Andrew Joseph. presents for a new onset A fib/flutter started during colonoscopy prep. Colonoscopy was cancelled. Pt saw Dr Margaretann Loveless on 4/7 - he was given Eliquis and Diltiazem - not taking either yet  SBP at home - 170-180 at home. He is very stressed  Not taking Eliquis yet  Atypical atrial flutter ([place]) - Plan: EKG 12-Lead  -The entirety of our visit was spent discussing his presentation with atypical atrial flutter in endoscopy yesterday as a result of stressful experience with bowel prep and anticipation of GI procedure.  I have strongly recommended to the patient that he begin anticoagulation with Eliquis 5 mg twice daily for stroke risk reduction.  He would like to consider this and not rush into a decision, and would like to discuss this further with Dr. Harrell Gave at their next appointment.  Earliest appointment I was able to secure is early May.  I have encouraged the patient to contact our office if he would like to begin Eliquis prior to awaiting this visit with his primary cardiologist.  I have also recommended an alternate rate control strategy since labetalol is short acting, he would like to continue on labetalol since he feels well and rates are adequately controlled today.  I suspect if the underlying rhythm is atrial flutter as it appears to be on both endoscopy ECGs as well as her ECG today he may need restoration of sinus rhythm with the assistance of cardioversion.  I would be happy to help arrange a TEE cardioversion or cardioversion for the patient if he calls and prior to his visit with Dr. Harrell Gave.  I have alerted him to heart failure symptoms to be on the look out for given the loss of atrial kick with atrial arrhythmias.  We were unable to discuss this at our visit due  to time, but I would also recommend a repeat echocardiogram.  His last echocardiogram was in 2015 with an ejection fraction of 50 to 55%, and mildly dilated LA.  I suspect with history of hypertension his left atrial size may have changed and this may be the nidus for continued atrial flutter.   The patient was advised to get vaccinated for COVID-19  Outpatient Medications Prior to Visit  Medication Sig Dispense Refill  . apixaban (ELIQUIS) 5 MG TABS tablet Take 1 tablet (5 mg total) by mouth 2 (two) times daily. 180 tablet 3  . aspirin EC 81 MG tablet Take 2 tablets every day 30 tablet 11  . Cholecalciferol (VITAMIN D3) 2000 UNITS capsule Take 1 capsule (2,000 Units total) by mouth daily. (Patient taking differently: Take 2,000 Units by mouth daily. This changes daily the amount - usually 3000 to 5000 units) 100 capsule 3  . desloratadine (CLARINEX) 5 MG tablet Take 1 tablet (5 mg total) by mouth daily. 30 tablet 11  . diltiazem (CARDIZEM CD) 120 MG 24 hr capsule Take 1 capsule (120 mg total) by mouth daily. 90 capsule 3  . hydroxychloroquine (PLAQUENIL) 200 MG tablet Take 400 mg bid x 2 days, then 200 mg daily for 5 dais if fever, cough, shortness of breath 13 tablet 0  . irbesartan-hydrochlorothiazide (AVALIDE) 150-12.5 MG tablet TAKE 1 TABLET BY MOUTH DAILY 90 tablet 1  . labetalol (NORMODYNE) 300 MG tablet Take  1 tablet (300 mg total) by mouth in the morning AND 0.5 tablets (150 mg total) every evening. 135 tablet 3  . rosuvastatin (CRESTOR) 20 MG tablet TAKE 1 TABLET BY MOUTH DAILY 90 tablet 0  . triamcinolone cream (KENALOG) 0.5 % Use tid prn 60 g 1  . zolpidem (AMBIEN) 10 MG tablet Take 1 tablet at night as directed 90 tablet 1  . 0.9 %  sodium chloride infusion      No facility-administered medications prior to visit.    ROS: Review of Systems  Constitutional: Negative for appetite change, fatigue and unexpected weight change.  HENT: Negative for congestion, nosebleeds, sneezing,  sore throat and trouble swallowing.   Eyes: Negative for itching and visual disturbance.  Respiratory: Negative for cough.   Cardiovascular: Negative for chest pain, palpitations and leg swelling.  Gastrointestinal: Negative for abdominal distention, blood in stool, diarrhea and nausea.  Genitourinary: Negative for frequency and hematuria.  Musculoskeletal: Negative for back pain, gait problem, joint swelling and neck pain.  Skin: Negative for rash.  Neurological: Negative for dizziness, tremors, speech difficulty and weakness.  Psychiatric/Behavioral: Negative for agitation, dysphoric mood and sleep disturbance. The patient is nervous/anxious.     Objective:  BP (!) 148/90 (BP Location: Left Arm)   Pulse (!) 112   Temp 97.9 F (36.6 C) (Oral)   Ht 5\' 8"  (1.727 m)   Wt 172 lb 3.2 oz (78.1 kg)   SpO2 96%   BMI 26.18 kg/m   BP Readings from Last 3 Encounters:  03/15/21 (!) 148/90  03/08/21 (!) 144/80  03/07/21 (!) 167/102    Wt Readings from Last 3 Encounters:  03/15/21 172 lb 3.2 oz (78.1 kg)  03/08/21 172 lb (78 kg)  03/07/21 170 lb (77.1 kg)    Physical Exam Constitutional:      General: He is not in acute distress.    Appearance: He is well-developed.     Comments: NAD  Eyes:     Conjunctiva/sclera: Conjunctivae normal.     Pupils: Pupils are equal, round, and reactive to light.  Neck:     Thyroid: No thyromegaly.     Vascular: No JVD.  Cardiovascular:     Rate and Rhythm: Normal rate. Rhythm irregular.     Heart sounds: Normal heart sounds. No murmur heard. No friction rub. No gallop.   Pulmonary:     Effort: Pulmonary effort is normal. No respiratory distress.     Breath sounds: Normal breath sounds. No wheezing or rales.  Chest:     Chest wall: No tenderness.  Abdominal:     General: Bowel sounds are normal. There is no distension.     Palpations: Abdomen is soft. There is no mass.     Tenderness: There is no abdominal tenderness. There is no guarding or  rebound.  Musculoskeletal:        General: No tenderness. Normal range of motion.     Cervical back: Normal range of motion.  Lymphadenopathy:     Cervical: No cervical adenopathy.  Skin:    General: Skin is warm and dry.     Findings: No rash.  Neurological:     Mental Status: He is alert and oriented to person, place, and time.     Cranial Nerves: No cranial nerve deficit.     Motor: No abnormal muscle tone.     Coordination: Coordination normal.     Gait: Gait normal.     Deep Tendon Reflexes: Reflexes are normal and  symmetric.  Psychiatric:        Behavior: Behavior normal.        Thought Content: Thought content normal.        Judgment: Judgment normal.    The patient is anxious about his diagnosis of atrial fibrillation is upset about the sudden onset.  Lab Results  Component Value Date   WBC 4.5 10/18/2020   HGB 14.8 10/18/2020   HCT 43.7 10/18/2020   PLT 125.0 (L) 10/18/2020   GLUCOSE 110 (H) 10/18/2020   CHOL 179 10/18/2020   TRIG 79.0 10/18/2020   HDL 50.70 10/18/2020   LDLDIRECT 296.1 08/12/2013   LDLCALC 113 (H) 10/18/2020   ALT 22 10/18/2020   AST 20 10/18/2020   NA 139 10/18/2020   K 3.6 10/18/2020   CL 103 10/18/2020   CREATININE 1.10 10/18/2020   BUN 16 10/18/2020   CO2 27 10/18/2020   TSH 2.15 10/18/2020   PSA 0.00 (L) 10/18/2020   INR 0.99 07/31/2011   HGBA1C 5.8 03/09/2014    US Venous Img Lower Unilateral Right (DVT)  Result Date: 06/16/2020 CLINICAL DATA:  Right lower extremity pain and edema. Evaluate for DVT. EXAM: RIGHT LOWER EXTREMITY VENOUS DOPPLER ULTRASOUND TECHNIQUE: Gray-scale sonography with graded compression, as well as color Doppler and duplex ultrasound were performed to evaluate the lower extremity deep venous systems from the level of the common femoral vein and including the common femoral, femoral, profunda femoral, popliteal and calf veins including the posterior tibial, peroneal and gastrocnemius veins when visible. The  superficial great saphenous vein was also interrogated. Spectral Doppler was utilized to evaluate flow at rest and with distal augmentation maneuvers in the common femoral, femoral and popliteal veins. COMPARISON:  None. FINDINGS: Contralateral Common Femoral Vein: Respiratory phasicity is normal and symmetric with the symptomatic side. No evidence of thrombus. Normal compressibility. Common Femoral Vein: No evidence of thrombus. Normal compressibility, respiratory phasicity and response to augmentation. Saphenofemoral Junction: No evidence of thrombus. Normal compressibility and flow on color Doppler imaging. Profunda Femoral Vein: No evidence of thrombus. Normal compressibility and flow on color Doppler imaging. Femoral Vein: No evidence of thrombus. Normal compressibility, respiratory phasicity and response to augmentation. Popliteal Vein: No evidence of thrombus. Normal compressibility, respiratory phasicity and response to augmentation. Calf Veins: No evidence of thrombus. Normal compressibility and flow on color Doppler imaging. Superficial Great Saphenous Vein: No evidence of thrombus. Normal compressibility. Venous Reflux:  None. Other Findings:  None. IMPRESSION: No evidence of DVT within the right lower extremity. Electronically Signed   By: Sandi Mariscal M.D.   On: 06/16/2020 15:38    Assessment & Plan:    Follow-up: No follow-ups on file.  [name], MD

## 2021-03-16 LAB — CBC WITH DIFFERENTIAL/PLATELET
Absolute Monocytes: 488 cells/uL (ref 200–950)
Basophils Absolute: 29 cells/uL (ref 0–200)
Basophils Relative: 0.7 %
Eosinophils Absolute: 98 cells/uL (ref 15–500)
Eosinophils Relative: 2.4 %
HCT: 43.7 % (ref 38.5–50.0)
Hemoglobin: 14.1 g/dL (ref 13.2–17.1)
Lymphs Abs: 984 cells/uL (ref 850–3900)
MCH: 28.8 pg (ref 27.0–33.0)
MCHC: 32.3 g/dL (ref 32.0–36.0)
MCV: 89.2 fL (ref 80.0–100.0)
MPV: 12.3 fL (ref 7.5–12.5)
Monocytes Relative: 11.9 %
Neutro Abs: 2501 cells/uL (ref 1500–7800)
Neutrophils Relative %: 61 %
Platelets: 106 10*3/uL — ABNORMAL LOW (ref 140–400)
RBC: 4.9 10*6/uL (ref 4.20–5.80)
RDW: 14.8 % (ref 11.0–15.0)
Total Lymphocyte: 24 %
WBC: 4.1 10*3/uL (ref 3.8–10.8)

## 2021-03-16 LAB — T4, FREE: Free T4: 1.3 ng/dL (ref 0.8–1.8)

## 2021-03-16 LAB — COMPREHENSIVE METABOLIC PANEL
AG Ratio: 1.9 (calc) (ref 1.0–2.5)
ALT: 34 U/L (ref 9–46)
AST: 30 U/L (ref 10–35)
Albumin: 4.2 g/dL (ref 3.6–5.1)
Alkaline phosphatase (APISO): 73 U/L (ref 35–144)
BUN/Creatinine Ratio: 13 (calc) (ref 6–22)
BUN: 17 mg/dL (ref 7–25)
CO2: 25 mmol/L (ref 20–32)
Calcium: 9.3 mg/dL (ref 8.6–10.3)
Chloride: 105 mmol/L (ref 98–110)
Creat: 1.29 mg/dL — ABNORMAL HIGH (ref 0.70–1.18)
Globulin: 2.2 g/dL (calc) (ref 1.9–3.7)
Glucose, Bld: 94 mg/dL (ref 65–99)
Potassium: 3.8 mmol/L (ref 3.5–5.3)
Sodium: 141 mmol/L (ref 135–146)
Total Bilirubin: 0.8 mg/dL (ref 0.2–1.2)
Total Protein: 6.4 g/dL (ref 6.1–8.1)

## 2021-03-16 LAB — URINALYSIS
Bilirubin Urine: NEGATIVE
Glucose, UA: NEGATIVE
Hgb urine dipstick: NEGATIVE
Ketones, ur: NEGATIVE
Leukocytes,Ua: NEGATIVE
Nitrite: NEGATIVE
Specific Gravity, Urine: 1.017 (ref 1.001–1.03)
pH: 6 (ref 5.0–8.0)

## 2021-03-16 LAB — TSH: TSH: 2.73 mIU/L (ref 0.40–4.50)

## 2021-03-16 LAB — MAGNESIUM: Magnesium: 1.9 mg/dL (ref 1.5–2.5)

## 2021-03-19 DIAGNOSIS — I4892 Unspecified atrial flutter: Secondary | ICD-10-CM | POA: Insufficient documentation

## 2021-03-19 NOTE — Assessment & Plan Note (Addendum)
The patient is upset about his new diagnosis of atrial flutter/atrial fibrillation.  We discussed the need for anticoagulation/rate control 1 more time.  He will start Eliquis today.  Okay to discontinue aspirin.  He will start diltiazem tomorrow.  Stress reduction discussed

## 2021-03-19 NOTE — Assessment & Plan Note (Signed)
Continue with as needed zolpidem

## 2021-03-19 NOTE — Assessment & Plan Note (Addendum)
The patient will start diltiazem tomorrow. he is on Avalide, labetalol

## 2021-03-19 NOTE — Addendum Note (Signed)
Addended by: [name] on: 03/19/2021 12:16 AM   Modules accepted: Orders

## 2021-03-26 ENCOUNTER — Other Ambulatory Visit: Payer: Self-pay

## 2021-03-26 ENCOUNTER — Encounter: Payer: Self-pay | Admitting: Internal Medicine

## 2021-03-26 ENCOUNTER — Ambulatory Visit (INDEPENDENT_AMBULATORY_CARE_PROVIDER_SITE_OTHER): Payer: Medicare PPO | Admitting: Internal Medicine

## 2021-03-26 VITALS — BP 152/81 | HR 80 | Ht 68.0 in | Wt 172.8 lb

## 2021-03-26 DIAGNOSIS — Z79899 Other long term (current) drug therapy: Secondary | ICD-10-CM

## 2021-03-26 DIAGNOSIS — Z955 Presence of coronary angioplasty implant and graft: Secondary | ICD-10-CM

## 2021-03-26 DIAGNOSIS — Z01812 Encounter for preprocedural laboratory examination: Secondary | ICD-10-CM

## 2021-03-26 DIAGNOSIS — I2510 Atherosclerotic heart disease of native coronary artery without angina pectoris: Secondary | ICD-10-CM

## 2021-03-26 DIAGNOSIS — I484 Atypical atrial flutter: Secondary | ICD-10-CM | POA: Diagnosis not present

## 2021-03-26 DIAGNOSIS — I10 Essential (primary) hypertension: Secondary | ICD-10-CM | POA: Diagnosis not present

## 2021-03-26 DIAGNOSIS — E7800 Pure hypercholesterolemia, unspecified: Secondary | ICD-10-CM

## 2021-03-26 NOTE — Progress Notes (Signed)
Cardiology Office Note:    Date:  03/26/2021   ID:  [name]., DOB [birth year], MRN [id number]  PCP:  [name], MD  Cardiologist:  [name], MD  Electrophysiologist:  None   Referring MD: [name], MD   Chief Complaint/Reason for Referral: Afib  History of Present Illness:    Tom Macpherson. is a 75 y.o. male with a history of coronary artery disease with remote stents, with documentation of distal LAD stent and last cardiac catheterization in 2012.  He has a family history of premature CAD.  Dr. Judeth Cornfield notes document a probable CVA in 2007.  History otherwise includes hypertension, hyperlipidemia. Mr. Severt presents today for follow-up.  Today, he reports feeling okay overall. He has retired since 11/2020. He has started taking Eliquis twice a day since his previous appointment 03/13/21 in order to prepare for a cardioversion, no bleeding events. We discussed the need for him to be on Eliquis for 4 weeks before scheduling his cardioversion, which should be after 04/09/21.  He is scheduled for an echocardiogram on 04/11/21. Denies any chest pain or pressure, shortness of breath, orthopnea or PND.  He walks occasionally up to 2-3 miles, but does not walk as fast or as long as what he considers baseline. He does not drink alcohol often, maybe once a month. Working on his hydration.  He has noticed any complications with the diltiazem.    He retired in order to [employer]. However, he is frustrated that this does not seem to be the case due to his health concerns and medical appointments which are a new source of stress.     Past Medical History:  Diagnosis Date  . Allergic rhinitis, cause unspecified   . Allergy    SEASONAL  . Anxiety   . Blood in stool   . BPH (benign prostatic hyperplasia)   . CAD (coronary artery disease)    DES mid lad 08/15/2009  . Cancer [facility]) 2009   prostate  . Chest pain, unspecified   . Colon polyps    . CVA (cerebrovascular accident due to intracerebral hemorrhage) [facility]) 2007 [place]  . History of prostate cancer 2009   Dr [name]  . HTN (hypertension)   . Hypercholesterolemia   . Hyperlipidemia   . Hypokalemia   . Insomnia, persistent   . Neoplasm of uncertain behavior of skin   . Stroke [facility])    2007  . Syncope   . Tubular adenoma of colon 2010    Past Surgical History:  Procedure Laterality Date  . ANGIOPLASTY     with stents  . COLONOSCOPY  2016   Larrie Kass  . POLYPECTOMY    . PROSTATECTOMY  2009  . TONSILLECTOMY      Current Medications: Current Meds  Medication Sig  . apixaban (ELIQUIS) 5 MG TABS tablet Take 1 tablet (5 mg total) by mouth 2 (two) times daily.  . Cholecalciferol (VITAMIN D3) 2000 UNITS capsule Take 1 capsule (2,000 Units total) by mouth daily. (Patient taking differently: Take 2,000 Units by mouth daily. This changes daily the amount - usually 3000 to 5000 units)  . diltiazem (CARDIZEM CD) 120 MG 24 hr capsule Take 1 capsule (120 mg total) by mouth daily.  . irbesartan-hydrochlorothiazide (AVALIDE) 150-12.5 MG tablet TAKE 1 TABLET BY MOUTH DAILY  . labetalol (NORMODYNE) 300 MG tablet Take 1 tablet (300 mg total) by mouth in the morning AND 0.5 tablets (150 mg total) every evening.  [name]  LORazepam (ATIVAN) 0.5 MG tablet Take 1 tablet (0.5 mg total) by mouth 2 (two) times daily as needed for anxiety.  . rosuvastatin (CRESTOR) 20 MG tablet TAKE 1 TABLET BY MOUTH DAILY  . triamcinolone cream (KENALOG) 0.5 % Use tid prn  . zolpidem (AMBIEN) 10 MG tablet Take 1 tablet at night as directed  . [DISCONTINUED] desloratadine (CLARINEX) 5 MG tablet Take 1 tablet (5 mg total) by mouth daily.     Allergies:   Amlodipine, Iodine, and Shellfish allergy   Social History   Tobacco Use  . Smoking status: Never Smoker  . Smokeless tobacco: Never Used  Vaping Use  . Vaping Use: Never used  Substance Use Topics  . Alcohol use: Yes    Comment: glass of wine- occ   per pt - occ mixed drink   . Drug use: No     Family History: The patient's family history includes Diabetes in his brother; Heart disease in his father; Stroke in his mother. There is no history of Colon polyps, Esophageal cancer, Rectal cancer, Stomach cancer, or Colon cancer.  ROS:   Please see the history of present illness.    All other systems reviewed and are negative.  EKGs/Labs/Other Studies Reviewed:    The following studies were reviewed today:  EKG:   03/08/2021: Atrial flutter, variable AV block, appears atypical atrial flutter. 03/26/2021: Atrial fibrillation, left anterior fascicular block, nonspecific ST abnormality inferior leads, rate: 80 bpm  Recent Labs: 03/15/2021: ALT 34; BUN 17; Creat 1.29; Hemoglobin 14.1; Magnesium 1.9; Platelets 106; Potassium 3.8; Sodium 141; TSH 2.73  Recent Lipid Panel    Component Value Date/Time   CHOL 179 10/18/2020 0949   TRIG 79.0 10/18/2020 0949   HDL 50.70 10/18/2020 0949   CHOLHDL 4 10/18/2020 0949   VLDL 15.8 10/18/2020 0949   LDLCALC 113 (H) 10/18/2020 0949   LDLDIRECT 296.1 08/12/2013 0936    Physical Exam:    VS:  BP (!) 152/81   Pulse 80   Ht 5\' 8"  (1.727 m)   Wt 172 lb 12.8 oz (78.4 kg)   SpO2 95%   BMI 26.27 kg/m     Wt Readings from Last 5 Encounters:  03/26/21 172 lb 12.8 oz (78.4 kg)  03/15/21 172 lb 3.2 oz (78.1 kg)  03/08/21 172 lb (78 kg)  03/07/21 170 lb (77.1 kg)  03/05/21 171 lb (77.6 kg)    Constitutional: No acute distress Eyes: sclera non-icteric, normal conjunctiva and lids ENMT: normal dentition, moist mucous membranes Cardiovascular: irregularly irregular rhythm, normal rate, no murmurs. S1 and S2 normal. Radial pulses normal bilaterally. No jugular venous distention.  Respiratory: clear to auscultation bilaterally GI : normal bowel sounds, soft and nontender. No distention.   MSK: extremities warm, well perfused. No edema.  NEURO: grossly nonfocal exam, moves all extremities. PSYCH:  alert and oriented x 3, normal mood and affect.   ASSESSMENT:    1. Atypical atrial flutter ([place])   2. Pre-procedure lab exam   3. Medication management   4. Coronary artery disease involving native coronary artery of native heart without angina pectoris   5. History of coronary angioplasty with insertion of stent   6. Essential hypertension   7. Pure hypercholesterolemia    PLAN:    Atypical atrial flutter ([place]) - Plan: EKG [id number], Basic metabolic panel, CBC  Pre-procedure lab exam - Plan: Basic metabolic panel, CBC   Will proceed with cardioversion on 04/13/21 with Dr. Debara Pickett. Will have had AC for 4  weeks at that point. Await TTE for evaluation of structural and function of heart.   May not need diltiazem after cardioversion, await post cardioversion ECG.   Risks, benefits and alternatives of direct current cardioversion reviewed including potential for post-cardioversion rhythms, especially life-threatening arrhythmias (ventricular tachycardia and fibrillation, profound bradycardia). Major complications may include serious or fatal arrhythmias, myocardial damage, and acute pulmonary edema; minor complications include skin burns and transient hypotension. Benefits include restoration of sinus rhythm. Alternatives to treatment were discussed, questions were answered. Patient is willing to proceed.   Total time of encounter: 30 minutes total time of encounter, including 25 minutes spent in face-to-face patient care on the date of this encounter. This time includes coordination of care and counseling regarding above mentioned problem list. Remainder of non-face-to-face time involved reviewing chart documents/testing relevant to the patient encounter and documentation in the medical record. I have independently reviewed documentation from referring provider.   Cherlynn Kaiser, MD, [place]   Shared Decision Making/Informed Consent:   Shared Decision  Making/Informed Consent The risks (stroke, cardiac arrhythmias rarely resulting in the need for a temporary or permanent pacemaker, skin irritation or burns and complications associated with conscious sedation including aspiration, arrhythmia, respiratory failure and death), benefits (restoration of normal sinus rhythm) and alternatives of a direct current cardioversion were explained in detail to Mr. Harbor and he agrees to proceed.    Medication Adjustments/Labs and Tests Ordered: Current medicines are reviewed at length with the patient today.  Concerns regarding medicines are outlined above.   Orders Placed This Encounter  Procedures  . Basic metabolic panel  . CBC  . EKG 12-Lead    No orders of the defined types were placed in this encounter.   Patient Instructions  Medication Instructions:  No Changes In Medications at this time.  *If you need a refill on your cardiac medications before your next appointment, please call your pharmacy*  Lab Work: [place]  If you have labs (blood work) drawn today and your tests are completely normal, you will receive your results only by: [name] MyChart Message (if you have MyChart) OR . A paper copy in the mail If you have any lab test that is abnormal or we need to change your treatment, we will call you to review the results.  Testing/Procedures: PLEASE SEE INSTRUCTIONS FOR CARDIOVERSION   Follow-Up: At [facility], you and your health needs are our priority.  As part of our continuing mission to provide you with exceptional heart care, we have created designated Provider Care Teams.  These Care Teams include your primary Cardiologist (physician) and Advanced Practice Providers (APPs -  Physician Assistants and Nurse Practitioners) who all work together to provide you with the care you need, when you need it.  We recommend signing up for the patient portal called "MyChart".  Sign up information is provided on this After Visit  Summary.  MyChart is used to connect with patients for Virtual Visits (Telemedicine).  Patients are able to view lab/test results, encounter notes, upcoming appointments, etc.  Non-urgent messages can be sent to your provider as well.   To learn more about what you can do with MyChart, go to [email or web address].    Your next appointment:   2 WEEKS POST CARDIOVERSION   The format for your next appointment:   In Person  Provider:   [name], MD  Other Instructions Dear Janit Pagan You are scheduled for a  Cardioversion on Friday MAY 13th  with Dr. Debara Pickett.  Please arrive at the [facility] (Main Entrance A) at [facility]: [place], [place] [address] at 6:30 am.   DIET: Nothing to eat or drink after midnight except a sip of water with medications (see medication instructions below)  Medication Instructions: YOU MAY TAKE ALL OF YOUR MEDICATIONS AS PRESCRIBED Continue your anticoagulant: Eliquis You will need to continue your anticoagulant after your procedure until you  are told by your  Provider that it is safe to stop  Labs: Come to: [place] DR. Brice Potteiger IS IN- ON Monday MAY 9th FOR LABS- NO APPOINTMENT NEEDED. LAB IS OPEN FROM 8am-4pm  You will need a COVID-19  test prior to your procedure. You are scheduled for Tuesday MAY 10th at 10:30 AM. This is a Drive Up Visit at [id number] [place], [place] [address]. Someone will direct you to the appropriate testing line. Stay in your car and someone will be with you shortly.  You must have a responsible person to drive you home and stay in the waiting area during your procedure. Failure to do so could result in cancellation.  Bring your insurance cards.  *Special Note: Every effort is made to have your procedure done on time. Occasionally there are emergencies that occur at the hospital that may cause delays. Please be patient if a delay does occur.       I,Mathew  Stumpf,acting as a Education administrator for Elouise Munroe, MD.,have documented all relevant documentation on the behalf of Elouise Munroe, MD,as directed by  Elouise Munroe, MD while in the presence of Elouise Munroe, MD.  I, Elouise Munroe, MD, have reviewed all documentation for this visit. The documentation on 03/26/21 for the exam, diagnosis, procedures, and orders are all accurate and complete.

## 2021-03-26 NOTE — Patient Instructions (Signed)
Medication Instructions:  No Changes In Medications at this time.  *If you need a refill on your cardiac medications before your next appointment, please call your pharmacy*  Lab Work: [place]  If you have labs (blood work) drawn today and your tests are completely normal, you will receive your results only by: [name] MyChart Message (if you have MyChart) OR . A paper copy in the mail If you have any lab test that is abnormal or we need to change your treatment, we will call you to review the results.  Testing/Procedures: PLEASE SEE INSTRUCTIONS FOR CARDIOVERSION   Follow-Up: At [facility], you and your health needs are our priority.  As part of our continuing mission to provide you with exceptional heart care, we have created designated Provider Care Teams.  These Care Teams include your primary Cardiologist (physician) and Advanced Practice Providers (APPs -  Physician Assistants and Nurse Practitioners) who all work together to provide you with the care you need, when you need it.  We recommend signing up for the patient portal called "MyChart".  Sign up information is provided on this After Visit Summary.  MyChart is used to connect with patients for Virtual Visits (Telemedicine).  Patients are able to view lab/test results, encounter notes, upcoming appointments, etc.  Non-urgent messages can be sent to your provider as well.   To learn more about what you can do with MyChart, go to [email or web address].    Your next appointment:   2 WEEKS POST CARDIOVERSION   The format for your next appointment:   In Person  Provider:   [name], MD  Other Instructions Dear Janit Pagan You are scheduled for a  Cardioversion on Friday MAY 13th with Dr. Debara Pickett.  Please arrive at the [facility] (Main Entrance A) at [facility]: [place], [place] [address] at 6:30 am.   DIET: Nothing to eat or drink after midnight except a sip of water with  medications (see medication instructions below)  Medication Instructions: YOU MAY TAKE ALL OF YOUR MEDICATIONS AS PRESCRIBED Continue your anticoagulant: Eliquis You will need to continue your anticoagulant after your procedure until you  are told by your  Provider that it is safe to stop  Labs: Come to: [place] DR. ACHARYA IS IN- ON Monday MAY 9th FOR LABS- NO APPOINTMENT NEEDED. LAB IS OPEN FROM 8am-4pm  You will need a COVID-19  test prior to your procedure. You are scheduled for Tuesday MAY 10th at 10:30 AM. This is a Drive Up Visit at [address] [place], [place] [address]. Someone will direct you to the appropriate testing line. Stay in your car and someone will be with you shortly.  You must have a responsible person to drive you home and stay in the waiting area during your procedure. Failure to do so could result in cancellation.  Bring your insurance cards.  *Special Note: Every effort is made to have your procedure done on time. Occasionally there are emergencies that occur at the hospital that may cause delays. Please be patient if a delay does occur.

## 2021-03-26 NOTE — H&P (View-Only) (Signed)
Cardiology Office Note:    Date:  03/26/2021   ID:  Andrew Essex., DOB [birth year], MRN [id number]  PCP:  [name], MD  Cardiologist:  [name], MD  Electrophysiologist:  None   Referring MD: [name], MD   Chief Complaint/Reason for Referral: Afib  History of Present Illness:    Andrew Macpherson. is a 75 y.o. male with a history of coronary artery disease with remote stents, with documentation of distal LAD stent and last cardiac catheterization in 2012.  He has a family history of premature CAD.  Dr. Judeth Cornfield notes document a probable CVA in 2007.  History otherwise includes hypertension, hyperlipidemia. Andrew Joseph presents today for follow-up.  Today, he reports feeling okay overall. He has retired since 11/2020. He has started taking Eliquis twice a day since his previous appointment 03/13/21 in order to prepare for a cardioversion, no bleeding events. We discussed the need for him to be on Eliquis for 4 weeks before scheduling his cardioversion, which should be after 04/09/21.  He is scheduled for an echocardiogram on 04/11/21. Denies any chest pain or pressure, shortness of breath, orthopnea or PND.  He walks occasionally up to 2-3 miles, but does not walk as fast or as long as what he considers baseline. He does not drink alcohol often, maybe once a month. Working on his hydration.  He has noticed any complications with the diltiazem.    He retired in order to [employer]. However, he is frustrated that this does not seem to be the case due to his health concerns and medical appointments which are a new source of stress.     Past Medical History:  Diagnosis Date  . Allergic rhinitis, cause unspecified   . Allergy    SEASONAL  . Anxiety   . Blood in stool   . BPH (benign prostatic hyperplasia)   . CAD (coronary artery disease)    DES mid lad 08/15/2009  . Cancer [facility]) 2009   prostate  . Chest pain, unspecified   . Colon polyps    . CVA (cerebrovascular accident due to intracerebral hemorrhage) [facility]) 2007 [place]  . History of prostate cancer 2009   Dr [name]  . HTN (hypertension)   . Hypercholesterolemia   . Hyperlipidemia   . Hypokalemia   . Insomnia, persistent   . Neoplasm of uncertain behavior of skin   . Stroke [facility])    2007  . Syncope   . Tubular adenoma of colon 2010    Past Surgical History:  Procedure Laterality Date  . ANGIOPLASTY     with stents  . COLONOSCOPY  2016   Larrie Kass  . POLYPECTOMY    . PROSTATECTOMY  2009  . TONSILLECTOMY      Current Medications: Current Meds  Medication Sig  . apixaban (ELIQUIS) 5 MG TABS tablet Take 1 tablet (5 mg total) by mouth 2 (two) times daily.  . Cholecalciferol (VITAMIN D3) 2000 UNITS capsule Take 1 capsule (2,000 Units total) by mouth daily. (Patient taking differently: Take 2,000 Units by mouth daily. This changes daily the amount - usually 3000 to 5000 units)  . diltiazem (CARDIZEM CD) 120 MG 24 hr capsule Take 1 capsule (120 mg total) by mouth daily.  . irbesartan-hydrochlorothiazide (AVALIDE) 150-12.5 MG tablet TAKE 1 TABLET BY MOUTH DAILY  . labetalol (NORMODYNE) 300 MG tablet Take 1 tablet (300 mg total) by mouth in the morning AND 0.5 tablets (150 mg total) every evening.  [name]  LORazepam (ATIVAN) 0.5 MG tablet Take 1 tablet (0.5 mg total) by mouth 2 (two) times daily as needed for anxiety.  . rosuvastatin (CRESTOR) 20 MG tablet TAKE 1 TABLET BY MOUTH DAILY  . triamcinolone cream (KENALOG) 0.5 % Use tid prn  . zolpidem (AMBIEN) 10 MG tablet Take 1 tablet at night as directed  . [DISCONTINUED] desloratadine (CLARINEX) 5 MG tablet Take 1 tablet (5 mg total) by mouth daily.     Allergies:   Amlodipine, Iodine, and Shellfish allergy   Social History   Tobacco Use  . Smoking status: Never Smoker  . Smokeless tobacco: Never Used  Vaping Use  . Vaping Use: Never used  Substance Use Topics  . Alcohol use: Yes    Comment: glass of wine- occ   per pt - occ mixed drink   . Drug use: No     Family History: The patient's family history includes Diabetes in his brother; Heart disease in his father; Stroke in his mother. There is no history of Colon polyps, Esophageal cancer, Rectal cancer, Stomach cancer, or Colon cancer.  ROS:   Please see the history of present illness.    All other systems reviewed and are negative.  EKGs/Labs/Other Studies Reviewed:    The following studies were reviewed today:  EKG:   03/08/2021: Atrial flutter, variable AV block, appears atypical atrial flutter. 03/26/2021: Atrial fibrillation, left anterior fascicular block, nonspecific ST abnormality inferior leads, rate: 80 bpm  Recent Labs: 03/15/2021: ALT 34; BUN 17; Creat 1.29; Hemoglobin 14.1; Magnesium 1.9; Platelets 106; Potassium 3.8; Sodium 141; TSH 2.73  Recent Lipid Panel    Component Value Date/Time   CHOL 179 10/18/2020 0949   TRIG 79.0 10/18/2020 0949   HDL 50.70 10/18/2020 0949   CHOLHDL 4 10/18/2020 0949   VLDL 15.8 10/18/2020 0949   LDLCALC 113 (H) 10/18/2020 0949   LDLDIRECT 296.1 08/12/2013 0936    Physical Exam:    VS:  BP (!) 152/81   Pulse 80   Ht 5\' 8"  (1.727 m)   Wt 172 lb 12.8 oz (78.4 kg)   SpO2 95%   BMI 26.27 kg/m     Wt Readings from Last 5 Encounters:  03/26/21 172 lb 12.8 oz (78.4 kg)  03/15/21 172 lb 3.2 oz (78.1 kg)  03/08/21 172 lb (78 kg)  03/07/21 170 lb (77.1 kg)  03/05/21 171 lb (77.6 kg)    Constitutional: No acute distress Eyes: sclera non-icteric, normal conjunctiva and lids ENMT: normal dentition, moist mucous membranes Cardiovascular: irregularly irregular rhythm, normal rate, no murmurs. S1 and S2 normal. Radial pulses normal bilaterally. No jugular venous distention.  Respiratory: clear to auscultation bilaterally GI : normal bowel sounds, soft and nontender. No distention.   MSK: extremities warm, well perfused. No edema.  NEURO: grossly nonfocal exam, moves all extremities. PSYCH:  alert and oriented x 3, normal mood and affect.   ASSESSMENT:    1. Atypical atrial flutter ([place])   2. Pre-procedure lab exam   3. Medication management   4. Coronary artery disease involving native coronary artery of native heart without angina pectoris   5. History of coronary angioplasty with insertion of stent   6. Essential hypertension   7. Pure hypercholesterolemia    PLAN:    Atypical atrial flutter ([place]) - Plan: EKG [id number], Basic metabolic panel, CBC  Pre-procedure lab exam - Plan: Basic metabolic panel, CBC   Will proceed with cardioversion on 04/13/21 with Dr. Debara Pickett. Will have had AC for 4  weeks at that point. Await TTE for evaluation of structural and function of heart.   May not need diltiazem after cardioversion, await post cardioversion ECG.   Risks, benefits and alternatives of direct current cardioversion reviewed including potential for post-cardioversion rhythms, especially life-threatening arrhythmias (ventricular tachycardia and fibrillation, profound bradycardia). Major complications may include serious or fatal arrhythmias, myocardial damage, and acute pulmonary edema; minor complications include skin burns and transient hypotension. Benefits include restoration of sinus rhythm. Alternatives to treatment were discussed, questions were answered. Patient is willing to proceed.   Total time of encounter: 30 minutes total time of encounter, including 25 minutes spent in face-to-face patient care on the date of this encounter. This time includes coordination of care and counseling regarding above mentioned problem list. Remainder of non-face-to-face time involved reviewing chart documents/testing relevant to the patient encounter and documentation in the medical record. Andrew Joseph have independently reviewed documentation from referring provider.   Cherlynn Kaiser, MD, [place]   Shared Decision Making/Informed Consent:   Shared Decision  Making/Informed Consent The risks (stroke, cardiac arrhythmias rarely resulting in the need for a temporary or permanent pacemaker, skin irritation or burns and complications associated with conscious sedation including aspiration, arrhythmia, respiratory failure and death), benefits (restoration of normal sinus rhythm) and alternatives of a direct current cardioversion were explained in detail to Andrew Joseph and he agrees to proceed.    Medication Adjustments/Labs and Tests Ordered: Current medicines are reviewed at length with the patient today.  Concerns regarding medicines are outlined above.   Orders Placed This Encounter  Procedures  . Basic metabolic panel  . CBC  . EKG 12-Lead    No orders of the defined types were placed in this encounter.   Patient Instructions  Medication Instructions:  No Changes In Medications at this time.  *If you need a refill on your cardiac medications before your next appointment, please call your pharmacy*  Lab Work: [place]  If you have labs (blood work) drawn today and your tests are completely normal, you will receive your results only by: [name] MyChart Message (if you have MyChart) OR . A paper copy in the mail If you have any lab test that is abnormal or we need to change your treatment, we will call you to review the results.  Testing/Procedures: PLEASE SEE INSTRUCTIONS FOR CARDIOVERSION   Follow-Up: At [facility], you and your health needs are our priority.  As part of our continuing mission to provide you with exceptional heart care, we have created designated Provider Care Teams.  These Care Teams include your primary Cardiologist (physician) and Advanced Practice Providers (APPs -  Physician Assistants and Nurse Practitioners) who all work together to provide you with the care you need, when you need it.  We recommend signing up for the patient portal called "MyChart".  Sign up information is provided on this After Visit  Summary.  MyChart is used to connect with patients for Virtual Visits (Telemedicine).  Patients are able to view lab/test results, encounter notes, upcoming appointments, etc.  Non-urgent messages can be sent to your provider as well.   To learn more about what you can do with MyChart, go to [email or web address].    Your next appointment:   2 WEEKS POST CARDIOVERSION   The format for your next appointment:   In Person  Provider:   [name], MD  Other Instructions Dear Andrew Joseph You are scheduled for a  Cardioversion on Friday MAY 13th  with Dr. Debara Pickett.  Please arrive at the [facility] (Main Entrance A) at [facility]: [place], [place] [address] at 6:30 am.   DIET: Nothing to eat or drink after midnight except a sip of water with medications (see medication instructions below)  Medication Instructions: YOU MAY TAKE ALL OF YOUR MEDICATIONS AS PRESCRIBED Continue your anticoagulant: Eliquis You will need to continue your anticoagulant after your procedure until you  are told by your  Provider that it is safe to stop  Labs: Come to: [place] DR. Glenetta Kiger IS IN- ON Monday MAY 9th FOR LABS- NO APPOINTMENT NEEDED. LAB IS OPEN FROM 8am-4pm  You will need a COVID-19  test prior to your procedure. You are scheduled for Tuesday MAY 10th at 10:30 AM. This is a Drive Up Visit at [id number] [place], [place] [address]. Someone will direct you to the appropriate testing line. Stay in your car and someone will be with you shortly.  You must have a responsible person to drive you home and stay in the waiting area during your procedure. Failure to do so could result in cancellation.  Bring your insurance cards.  *Special Note: Every effort is made to have your procedure done on time. Occasionally there are emergencies that occur at the hospital that may cause delays. Please be patient if a delay does occur.       Andrew Joseph,Andrew  Joseph,acting as a Education administrator for Elouise Munroe, MD.,have documented all relevant documentation on the behalf of Elouise Munroe, MD,as directed by  Elouise Munroe, MD while in the presence of Elouise Munroe, MD.  Andrew Joseph, Elouise Munroe, MD, have reviewed all documentation for this visit. The documentation on 03/26/21 for the exam, diagnosis, procedures, and orders are all accurate and complete.

## 2021-03-28 ENCOUNTER — Ambulatory Visit: Payer: Medicare PPO | Admitting: Internal Medicine

## 2021-03-30 ENCOUNTER — Ambulatory Visit: Payer: Medicare PPO | Admitting: Cardiology

## 2021-04-02 ENCOUNTER — Ambulatory Visit: Payer: Medicare PPO | Admitting: Cardiology

## 2021-04-09 DIAGNOSIS — Z01812 Encounter for preprocedural laboratory examination: Secondary | ICD-10-CM | POA: Diagnosis not present

## 2021-04-09 DIAGNOSIS — I484 Atypical atrial flutter: Secondary | ICD-10-CM | POA: Diagnosis not present

## 2021-04-09 LAB — BASIC METABOLIC PANEL
BUN/Creatinine Ratio: 12 (ref 10–24)
BUN: 13 mg/dL (ref 8–27)
CO2: 26 mmol/L (ref 20–29)
Calcium: 9.7 mg/dL (ref 8.6–10.2)
Chloride: 101 mmol/L (ref 96–106)
Creatinine, Ser: 1.09 mg/dL (ref 0.76–1.27)
Glucose: 96 mg/dL (ref 65–99)
Potassium: 4 mmol/L (ref 3.5–5.2)
Sodium: 140 mmol/L (ref 134–144)
eGFR: 71 mL/min/{1.73_m2} (ref >59)

## 2021-04-09 LAB — CBC
Hematocrit: 47.7 % (ref 37.5–51.0)
Hemoglobin: 15.6 g/dL (ref 13.0–17.7)
MCH: 28.7 pg (ref 26.6–33.0)
MCHC: 32.7 g/dL (ref 31.5–35.7)
MCV: 88 fL (ref 79–97)
Platelets: 122 10*3/uL — ABNORMAL LOW (ref 150–450)
RBC: 5.43 x10E6/uL (ref 4.14–5.80)
RDW: 14.9 % (ref 11.6–15.4)
WBC: 3.8 10*3/uL (ref 3.4–10.8)

## 2021-04-10 ENCOUNTER — Other Ambulatory Visit (HOSPITAL_COMMUNITY)
Admission: RE | Admit: 2021-04-10 | Discharge: 2021-04-10 | Disposition: A | Discharge status: Home / Self Care | Payer: Medicare PPO | Source: Ambulatory Visit | Attending: Internal Medicine | Admitting: Internal Medicine

## 2021-04-10 DIAGNOSIS — Z20822 Contact with and (suspected) exposure to covid-19: Secondary | ICD-10-CM | POA: Insufficient documentation

## 2021-04-10 DIAGNOSIS — Z01812 Encounter for preprocedural laboratory examination: Secondary | ICD-10-CM | POA: Insufficient documentation

## 2021-04-10 LAB — SARS CORONAVIRUS 2 (TAT 6-24 HRS): SARS Coronavirus 2: NEGATIVE

## 2021-04-11 ENCOUNTER — Other Ambulatory Visit: Payer: Self-pay

## 2021-04-11 ENCOUNTER — Ambulatory Visit (HOSPITAL_COMMUNITY): Payer: Medicare PPO | Attending: Cardiology

## 2021-04-11 DIAGNOSIS — I484 Atypical atrial flutter: Secondary | ICD-10-CM | POA: Diagnosis not present

## 2021-04-11 DIAGNOSIS — I2510 Atherosclerotic heart disease of native coronary artery without angina pectoris: Secondary | ICD-10-CM | POA: Insufficient documentation

## 2021-04-11 LAB — ECHOCARDIOGRAM COMPLETE
Area-P 1/2: 2.59 cm2
P 1/2 time: 351 msec
S' Lateral: 3.9 cm

## 2021-04-12 ENCOUNTER — Other Ambulatory Visit (INDEPENDENT_AMBULATORY_CARE_PROVIDER_SITE_OTHER): Payer: Medicare PPO

## 2021-04-12 ENCOUNTER — Telehealth: Payer: Self-pay | Admitting: *Deleted

## 2021-04-12 DIAGNOSIS — R7989 Other specified abnormal findings of blood chemistry: Secondary | ICD-10-CM

## 2021-04-12 LAB — BASIC METABOLIC PANEL
BUN: 19 mg/dL (ref 6–23)
CO2: 29 mEq/L (ref 19–32)
Calcium: 9.6 mg/dL (ref 8.4–10.5)
Chloride: 104 mEq/L (ref 96–112)
Creatinine, Ser: 1.3 mg/dL (ref 0.40–1.50)
GFR: 53.56 mL/min — ABNORMAL LOW (ref >60.00)
Glucose, Bld: 103 mg/dL — ABNORMAL HIGH (ref 70–99)
Potassium: 3.9 mEq/L (ref 3.5–5.1)
Sodium: 140 mEq/L (ref 135–145)

## 2021-04-12 NOTE — Telephone Encounter (Signed)
Pt is here for labs. There is no orders in epic. Reviewed previous labs.. creatinine was elevated, but MD did not states to repeat. Ask MD if he would like to repeat kidney functions since creatinine was elevated. Per MD ok to order BMET. Entered ordered. Inform front staff to have pt to go back to the lab to have done.Marland KitchenJohny Chess

## 2021-04-12 NOTE — Telephone Encounter (Signed)
-----   Message from Kirke Shaggy, RN sent at 04/12/2021  2:39 PM EDT ----- Regarding: orders Andrew Joseph, this patient does not have any orders for DCCV scheduled tomorrow 5/13. Thanks

## 2021-04-13 ENCOUNTER — Ambulatory Visit (HOSPITAL_COMMUNITY): Payer: Medicare PPO | Admitting: Registered Nurse

## 2021-04-13 ENCOUNTER — Encounter (HOSPITAL_COMMUNITY)
Admission: RE | Disposition: A | Discharge status: Home / Self Care | Payer: Self-pay | Source: Home / Self Care | Attending: Internal Medicine

## 2021-04-13 ENCOUNTER — Encounter (HOSPITAL_COMMUNITY): Payer: Self-pay | Admitting: Internal Medicine

## 2021-04-13 ENCOUNTER — Ambulatory Visit (HOSPITAL_COMMUNITY)
Admission: RE | Admit: 2021-04-13 | Discharge: 2021-04-13 | Disposition: A | Discharge status: Home / Self Care | Payer: Medicare PPO | Attending: Internal Medicine | Admitting: Internal Medicine

## 2021-04-13 ENCOUNTER — Other Ambulatory Visit: Payer: Self-pay

## 2021-04-13 DIAGNOSIS — Z7901 Long term (current) use of anticoagulants: Secondary | ICD-10-CM | POA: Diagnosis not present

## 2021-04-13 DIAGNOSIS — Z79899 Other long term (current) drug therapy: Secondary | ICD-10-CM | POA: Insufficient documentation

## 2021-04-13 DIAGNOSIS — E785 Hyperlipidemia, unspecified: Secondary | ICD-10-CM | POA: Insufficient documentation

## 2021-04-13 DIAGNOSIS — I484 Atypical atrial flutter: Secondary | ICD-10-CM

## 2021-04-13 DIAGNOSIS — Z8249 Family history of ischemic heart disease and other diseases of the circulatory system: Secondary | ICD-10-CM | POA: Diagnosis not present

## 2021-04-13 DIAGNOSIS — I2510 Atherosclerotic heart disease of native coronary artery without angina pectoris: Secondary | ICD-10-CM | POA: Diagnosis not present

## 2021-04-13 DIAGNOSIS — Z8546 Personal history of malignant neoplasm of prostate: Secondary | ICD-10-CM | POA: Insufficient documentation

## 2021-04-13 DIAGNOSIS — Z823 Family history of stroke: Secondary | ICD-10-CM | POA: Insufficient documentation

## 2021-04-13 DIAGNOSIS — Z888 Allergy status to other drugs, medicaments and biological substances status: Secondary | ICD-10-CM | POA: Diagnosis not present

## 2021-04-13 DIAGNOSIS — Z91013 Allergy to seafood: Secondary | ICD-10-CM | POA: Insufficient documentation

## 2021-04-13 DIAGNOSIS — I10 Essential (primary) hypertension: Secondary | ICD-10-CM | POA: Insufficient documentation

## 2021-04-13 DIAGNOSIS — Z833 Family history of diabetes mellitus: Secondary | ICD-10-CM | POA: Insufficient documentation

## 2021-04-13 DIAGNOSIS — Z955 Presence of coronary angioplasty implant and graft: Secondary | ICD-10-CM | POA: Diagnosis not present

## 2021-04-13 DIAGNOSIS — I4892 Unspecified atrial flutter: Secondary | ICD-10-CM | POA: Diagnosis not present

## 2021-04-13 HISTORY — PX: CARDIOVERSION: SHX1299

## 2021-04-13 SURGERY — CARDIOVERSION
Anesthesia: General

## 2021-04-13 MED ORDER — SODIUM CHLORIDE 0.9 % IV SOLN: INTRAVENOUS | Status: DC | PRN

## 2021-04-13 MED ORDER — LIDOCAINE 2% (20 MG/ML) 5 ML SYRINGE
INTRAMUSCULAR | Status: DC | PRN
  Administered 2021-04-13: 30 mg via INTRAVENOUS

## 2021-04-13 MED ORDER — PROPOFOL 10 MG/ML IV BOLUS
INTRAVENOUS | Status: DC | PRN
  Administered 2021-04-13: 70 mg via INTRAVENOUS

## 2021-04-13 NOTE — Telephone Encounter (Signed)
Re: DCCV: they need to check w/Cardiology Thx

## 2021-04-13 NOTE — Anesthesia Postprocedure Evaluation (Signed)
Anesthesia Post Note  Patient: Andrew Joseph.  Procedure(s) Performed: CARDIOVERSION (N/A )     Patient location during evaluation: Endoscopy Anesthesia Type: General Level of consciousness: awake and alert Pain management: pain level controlled Vital Signs Assessment: post-procedure vital signs reviewed and stable Respiratory status: spontaneous breathing, nonlabored ventilation, respiratory function stable and patient connected to nasal cannula oxygen Cardiovascular status: blood pressure returned to baseline and stable Postop Assessment: no apparent nausea or vomiting Anesthetic complications: no   No complications documented.  Last Vitals:  Vitals:   04/13/21 0647 04/13/21 0753  BP: (!) 178/72 (!) 141/67  Pulse: 80 71  Resp: 16 14  Temp: 36.5 C   SpO2: 96% 92%    Last Pain:  Vitals:   04/13/21 0753  TempSrc:   PainSc: Asleep                 Taejah Ohalloran COKER

## 2021-04-13 NOTE — Transfer of Care (Signed)
Immediate Anesthesia Transfer of Care Note  Patient: Andrew Joseph.  Procedure(s) Performed: CARDIOVERSION (N/A )  Patient Location: PACU and Endoscopy Unit  Anesthesia Type:General  Level of Consciousness: awake and drowsy  Airway & Oxygen Therapy: Patient Spontanous Breathing  Post-op Assessment: Report given to RN and Post -op Vital signs reviewed and stable  Post vital signs: Reviewed and stable  Last Vitals:  Vitals Value Taken Time  BP    Temp    Pulse    Resp    SpO2      Last Pain:  Vitals:   04/13/21 0647  TempSrc: Oral  PainSc: 0-No pain         Complications: No complications documented.

## 2021-04-13 NOTE — CV Procedure (Signed)
   CARDIOVERSION NOTE  Procedure: Electrical Cardioversion Indications:  Atrial Flutter  Procedure Details:  Consent: Risks of procedure as well as the alternatives and risks of each were explained to the (patient/caregiver).  Consent for procedure obtained.  Time Out: Verified patient identification, verified procedure, site/side was marked, verified correct patient position, special equipment/implants available, medications/allergies/relevent history reviewed, required imaging and test results available.  Performed  Patient placed on cardiac monitor, pulse oximetry, supplemental oxygen as necessary.  Sedation given: propofol per anesthesia Pacer pads placed anterior and posterior chest.  Cardioverted 1 time(s).  Cardioverted at 150J biphasic.  Impression: Findings: Post procedure EKG shows: NSR Complications: None Patient did tolerate procedure well.  Plan: 1. Successful DCCV with a single 150J biphasic shock 2. Follow-up with Dr. Margaretann Loveless per patient request  Time Spent Directly with the Patient:  30 minutes   [name], MD, [facility], [place] Director of the Advanced Lipid Disorders &  Cardiovascular Risk Reduction Clinic Diplomate of the American Board of Clinical Lipidology Attending Cardiologist  Direct Dial: [phone]  Fax: [phone]  Website:  [email or web address] 04/13/2021, 7:47 AM

## 2021-04-13 NOTE — Interval H&P Note (Signed)
History and Physical Interval Note:  04/13/2021 7:35 AM  Andrew Joseph.  has presented today for surgery, with the diagnosis of A-FLUTTER.  The various methods of treatment have been discussed with the patient and family. After consideration of risks, benefits and other options for treatment, the patient has consented to  Procedure(s): CARDIOVERSION (N/A) as a surgical intervention.  The patient's history has been reviewed, patient examined, no change in status, stable for surgery.  I have reviewed the patient's chart and labs.  Questions were answered to the patient's satisfaction.     [name]

## 2021-04-13 NOTE — Anesthesia Procedure Notes (Signed)
Date/Time: 04/13/2021 7:37 AM Performed by: Trinna Post., CRNA Pre-anesthesia Checklist: Patient identified, Emergency Drugs available, Suction available, Patient being monitored and Timeout performed Patient Re-evaluated:Patient Re-evaluated prior to induction Oxygen Delivery Method: Ambu bag Preoxygenation: Pre-oxygenation with 100% oxygen Induction Type: IV induction Placement Confirmation: positive ETCO2

## 2021-04-13 NOTE — Anesthesia Preprocedure Evaluation (Addendum)
Anesthesia Evaluation  Patient identified by MRN, date of birth, ID band Patient awake    Reviewed: Allergy & Precautions, H&P , NPO status , Patient's Chart, lab work & pertinent test results  Airway Mallampati: II   Neck ROM: full    Dental  (+) Teeth Intact   Pulmonary neg pulmonary ROS,    breath sounds clear to auscultation       Cardiovascular hypertension, + CAD  + dysrhythmias Atrial Fibrillation  Rhythm:irregular Rate:Normal     Neuro/Psych  Headaches, Seizures -,  Anxiety CVA    GI/Hepatic   Endo/Other    Renal/GU      Musculoskeletal   Abdominal   Peds  Hematology   Anesthesia Other Findings   Reproductive/Obstetrics                            Anesthesia Physical Anesthesia Plan  ASA: III  Anesthesia Plan: General   Post-op Pain Management:    Induction: Intravenous  PONV Risk Score and Plan: 2  Airway Management Planned: Mask  Additional Equipment:   Intra-op Plan:   Post-operative Plan:   Informed Consent: I have reviewed the patients History and Physical, chart, labs and discussed the procedure including the risks, benefits and alternatives for the proposed anesthesia with the patient or authorized representative who has indicated his/her understanding and acceptance.     Dental advisory given  Plan Discussed with: CRNA, Anesthesiologist and Surgeon  Anesthesia Plan Comments:         Anesthesia Quick Evaluation

## 2021-04-13 NOTE — Discharge Instructions (Signed)

## 2021-04-15 ENCOUNTER — Encounter (HOSPITAL_COMMUNITY): Payer: Self-pay | Admitting: Internal Medicine

## 2021-04-16 ENCOUNTER — Ambulatory Visit (INDEPENDENT_AMBULATORY_CARE_PROVIDER_SITE_OTHER): Payer: Medicare PPO | Admitting: Internal Medicine

## 2021-04-16 ENCOUNTER — Telehealth: Payer: Self-pay | Admitting: Internal Medicine

## 2021-04-16 ENCOUNTER — Encounter: Payer: Self-pay | Admitting: Internal Medicine

## 2021-04-16 ENCOUNTER — Other Ambulatory Visit: Payer: Self-pay

## 2021-04-16 DIAGNOSIS — E785 Hyperlipidemia, unspecified: Secondary | ICD-10-CM

## 2021-04-16 DIAGNOSIS — I2510 Atherosclerotic heart disease of native coronary artery without angina pectoris: Secondary | ICD-10-CM

## 2021-04-16 DIAGNOSIS — I4892 Unspecified atrial flutter: Secondary | ICD-10-CM | POA: Diagnosis not present

## 2021-04-16 DIAGNOSIS — I10 Essential (primary) hypertension: Secondary | ICD-10-CM | POA: Diagnosis not present

## 2021-04-16 DIAGNOSIS — Z8546 Personal history of malignant neoplasm of prostate: Secondary | ICD-10-CM | POA: Diagnosis not present

## 2021-04-16 NOTE — Assessment & Plan Note (Signed)
Monitor PSA 

## 2021-04-16 NOTE — Assessment & Plan Note (Signed)
No angina 

## 2021-04-16 NOTE — Progress Notes (Signed)
Subjective:  Patient ID: Andrew Essex., male    DOB: 08/28/1946  Age: 75 y.o. MRN: [id number]  CC: Follow-up (6 month f/u)   HPI Andrew Essex. presents for A fib: s/p cardioversion last week, HTN, dyslipidemia  Outpatient Medications Prior to Visit  Medication Sig Dispense Refill  . apixaban (ELIQUIS) 5 MG TABS tablet Take 1 tablet (5 mg total) by mouth 2 (two) times daily. 180 tablet 3  . Cholecalciferol (VITAMIN D3) 2000 UNITS capsule Take 1 capsule (2,000 Units total) by mouth daily. (Patient taking differently: Take 2,000 Units by mouth daily. This changes daily the amount - usually 2000 to 3000 units) 100 capsule 3  . diltiazem (CARDIZEM CD) 120 MG 24 hr capsule Take 1 capsule (120 mg total) by mouth daily. 90 capsule 3  . irbesartan-hydrochlorothiazide (AVALIDE) 150-12.5 MG tablet TAKE 1 TABLET BY MOUTH DAILY 90 tablet 1  . labetalol (NORMODYNE) 300 MG tablet Take 1 tablet (300 mg total) by mouth in the morning AND 0.5 tablets (150 mg total) every evening. 135 tablet 3  . loratadine (CLARITIN) 10 MG tablet Take 10 mg by mouth daily.    [name] LORazepam (ATIVAN) 0.5 MG tablet Take 1 tablet (0.5 mg total) by mouth 2 (two) times daily as needed for anxiety. 30 tablet 1  . rosuvastatin (CRESTOR) 20 MG tablet TAKE 1 TABLET BY MOUTH DAILY (Patient taking differently: Take 20 mg by mouth daily.) 90 tablet 0  . triamcinolone cream (KENALOG) 0.5 % Use tid prn (Patient taking differently: Apply 1 application topically 3 (three) times daily as needed (irritation). Use tid prn) 60 g 1  . zolpidem (AMBIEN) 10 MG tablet Take 1 tablet at night as directed (Patient taking differently: Take 10 mg by mouth at bedtime. Take 1 tablet at night as directed) 90 tablet 1   No facility-administered medications prior to visit.    ROS: Review of Systems  Constitutional: Negative for appetite change, fatigue and unexpected weight change.  HENT: Negative for congestion, nosebleeds, sneezing, sore  throat and trouble swallowing.   Eyes: Negative for itching and visual disturbance.  Respiratory: Negative for cough.   Cardiovascular: Negative for chest pain, palpitations and leg swelling.  Gastrointestinal: Negative for abdominal distention, blood in stool, diarrhea and nausea.  Genitourinary: Negative for frequency and hematuria.  Musculoskeletal: Negative for back pain, gait problem, joint swelling and neck pain.  Skin: Negative for rash.  Neurological: Negative for dizziness, tremors, speech difficulty and weakness.  Psychiatric/Behavioral: Negative for agitation, dysphoric mood and sleep disturbance. The patient is not nervous/anxious.     Objective:  BP (!) 162/70 (BP Location: Left Arm)   Pulse 62   Temp 97.8 F (36.6 C) (Oral)   Ht 5\' 8"  (1.727 m)   Wt 171 lb (77.6 kg)   SpO2 96%   BMI 26.00 kg/m   BP Readings from Last 3 Encounters:  04/16/21 (!) 162/70  04/13/21 139/67  03/26/21 (!) 152/81    Wt Readings from Last 3 Encounters:  04/16/21 171 lb (77.6 kg)  04/13/21 167 lb (75.8 kg)  03/26/21 172 lb 12.8 oz (78.4 kg)    Physical Exam Constitutional:      General: He is not in acute distress.    Appearance: He is well-developed.     Comments: NAD  Eyes:     Conjunctiva/sclera: Conjunctivae normal.     Pupils: Pupils are equal, round, and reactive to light.  Neck:     Thyroid: No thyromegaly.  Vascular: No JVD.  Cardiovascular:     Rate and Rhythm: Normal rate and regular rhythm.     Heart sounds: Normal heart sounds. No murmur heard. No friction rub. No gallop.   Pulmonary:     Effort: Pulmonary effort is normal. No respiratory distress.     Breath sounds: Normal breath sounds. No wheezing or rales.  Chest:     Chest wall: No tenderness.  Abdominal:     General: Bowel sounds are normal. There is no distension.     Palpations: Abdomen is soft. There is no mass.     Tenderness: There is no abdominal tenderness. There is no guarding or rebound.   Musculoskeletal:        General: No tenderness. Normal range of motion.     Cervical back: Normal range of motion.  Lymphadenopathy:     Cervical: No cervical adenopathy.  Skin:    General: Skin is warm and dry.     Findings: No rash.  Neurological:     Mental Status: He is alert and oriented to person, place, and time.     Cranial Nerves: No cranial nerve deficit.     Motor: No abnormal muscle tone.     Coordination: Coordination normal.     Gait: Gait normal.     Deep Tendon Reflexes: Reflexes are normal and symmetric.  Psychiatric:        Behavior: Behavior normal.        Thought Content: Thought content normal.        Judgment: Judgment normal.     Lab Results  Component Value Date   WBC 3.8 04/09/2021   HGB 15.6 04/09/2021   HCT 47.7 04/09/2021   PLT 122 (L) 04/09/2021   GLUCOSE 103 (H) 04/12/2021   CHOL 179 10/18/2020   TRIG 79.0 10/18/2020   HDL 50.70 10/18/2020   LDLDIRECT 296.1 08/12/2013   LDLCALC 113 (H) 10/18/2020   ALT 34 03/15/2021   AST 30 03/15/2021   NA 140 04/12/2021   K 3.9 04/12/2021   CL 104 04/12/2021   CREATININE 1.30 04/12/2021   BUN 19 04/12/2021   CO2 29 04/12/2021   TSH 2.73 03/15/2021   PSA 0.00 (L) 10/18/2020   INR 0.99 07/31/2011   HGBA1C 5.8 03/09/2014    ECHOCARDIOGRAM COMPLETE  Result Date: 04/11/2021    ECHOCARDIOGRAM REPORT   Patient Name:   Andrew Marzette. Date of Exam: 04/11/2021 Medical Rec #:  [id number]           Height:       68.0 in Accession #:    [id number]          Weight:       172.8 lb Date of Birth:  09/01/1946           BSA:          1.921 m Patient Age:    74 years            BP:           152/81 mmHg Patient Gender: M                   HR:           65 bpm. Exam Location:  [place] Procedure: 2D Echo, 3D Echo, Cardiac Doppler and Color Doppler Indications:    I48.4 Atypical Atrial Flutter  History:        Patient has prior history of Echocardiogram examinations, most  recent 08/19/2014. CAD and  Previous Myocardial Infarction,                 Stroke, Arrythmias:Atrial Fibrillation and Atrial Flutter,                 Signs/Symptoms:Chest Pain and Syncope; Risk Factors:Family                 History of Coronary Artery Disease, Hypertension and                 Dyslipidemia.  Sonographer:    Deliah Boston RDCS Referring Phys: [id number] [place]  1. Left ventricular ejection fraction, by estimation, is 45 to 50%. The left ventricle has mildly decreased function. The left ventricle demonstrates global hypokinesis. Left ventricular diastolic parameters were normal.  2. Right ventricular systolic function is normal. The right ventricular size is mildly enlarged. There is normal pulmonary artery systolic pressure.  3. Left atrial size was severely dilated.  4. Right atrial size was moderately dilated.  5. The mitral valve is degenerative. No evidence of mitral valve regurgitation. No evidence of mitral stenosis.  6. The aortic valve is calcified. There is mild calcification of the aortic valve. There is mild thickening of the aortic valve. Aortic valve regurgitation is mild. Mild to moderate aortic valve sclerosis/calcification is present, without any evidence of aortic stenosis. Aortic regurgitation PHT measures 351 msec.  7. The inferior vena cava is normal in size with greater than 50% respiratory variability, suggesting right atrial pressure of 3 mmHg. FINDINGS  Left Ventricle: Left ventricular ejection fraction, by estimation, is 45 to 50%. The left ventricle has mildly decreased function. The left ventricle demonstrates global hypokinesis. 3D left ventricular ejection fraction analysis performed but not reported based on interpreter judgement due to suboptimal quality. The left ventricular internal cavity size was normal in size. There is no left ventricular hypertrophy. Left ventricular diastolic parameters were normal. Right Ventricle: The right ventricular size is mildly enlarged.  No increase in right ventricular wall thickness. Right ventricular systolic function is normal. There is normal pulmonary artery systolic pressure. The tricuspid regurgitant velocity is 2.58  m/s, and with an assumed right atrial pressure of 3 mmHg, the estimated right ventricular systolic pressure is 64.6 mmHg. Left Atrium: Left atrial size was severely dilated. Right Atrium: Right atrial size was moderately dilated. Pericardium: There is no evidence of pericardial effusion. Mitral Valve: The mitral valve is degenerative in appearance. There is mild thickening of the mitral valve leaflet(s). There is mild calcification of the mitral valve leaflet(s). Mild mitral annular calcification. No evidence of mitral valve regurgitation. No evidence of mitral valve stenosis. Tricuspid Valve: The tricuspid valve is normal in structure. Tricuspid valve regurgitation is mild . No evidence of tricuspid stenosis. Aortic Valve: The aortic valve is calcified. There is mild calcification of the aortic valve. There is mild thickening of the aortic valve. Aortic valve regurgitation is mild. Aortic regurgitation PHT measures 351 msec. Mild to moderate aortic valve sclerosis/calcification is present, without any evidence of aortic stenosis. Pulmonic Valve: The pulmonic valve was normal in structure. Pulmonic valve regurgitation is trivial. No evidence of pulmonic stenosis. Aorta: The aortic root is normal in size and structure. Venous: The inferior vena cava is normal in size with greater than 50% respiratory variability, suggesting right atrial pressure of 3 mmHg. IAS/Shunts: No atrial level shunt detected by color flow Doppler.  LEFT VENTRICLE PLAX 2D LVIDd:         5.05 cm  Diastology LVIDs:         3.90 cm  LV e' medial:    7.18 cm/s LV PW:         1.05 cm  LV E/e' medial:  10.8 LV IVS:        0.75 cm  LV e' lateral:   12.40 cm/s LVOT diam:     2.00 cm  LV E/e' lateral: 6.3 LV SV:         73 LV SV Index:   38 LVOT Area:     3.14 cm                           3D Volume EF:                         3D EF:        55 %                         LV EDV:       163 ml                         LV ESV:       73 ml                         LV SV:        90 ml RIGHT VENTRICLE RV S prime:     13.60 cm/s TAPSE (M-mode): 2.0 cm LEFT ATRIUM              Index       RIGHT ATRIUM           Index LA diam:        4.50 cm  2.34 cm/m  RA Area:     28.00 cm LA Vol (A2C):   124.0 ml 64.56 ml/m RA Volume:   95.10 ml  49.51 ml/m LA Vol (A4C):   106.0 ml 55.19 ml/m LA Biplane Vol: 118.0 ml 61.44 ml/m  AORTIC VALVE LVOT Vmax:   110.50 cm/s LVOT Vmean:  69.300 cm/s LVOT VTI:    0.232 m AI PHT:      351 msec  AORTA Ao Root diam: 3.10 cm Ao Asc diam:  3.20 cm MITRAL VALVE               TRICUSPID VALVE MV Area (PHT): cm         TR Peak grad:   26.6 mmHg MV Decel Time: 293 msec    TR Vmax:        258.00 cm/s MV E velocity: 77.75 cm/s MV A velocity: 38.60 cm/s  SHUNTS MV E/A ratio:  2.01        Systemic VTI:  0.23 m                            Systemic Diam: 2.00 cm Candee Furbish MD Electronically signed by Candee Furbish MD Signature Date/Time: 04/11/2021/11:20:41 AM    Final     Assessment & Plan:    [name], MD

## 2021-04-16 NOTE — Telephone Encounter (Signed)
Patient wants to make sure a copy of his lab results have been mailed for him to see, if not please do so.

## 2021-04-16 NOTE — Assessment & Plan Note (Signed)
S/p cardioversion 04/13/21

## 2021-04-16 NOTE — Assessment & Plan Note (Addendum)
Re-check BP On Avalide and diltiazem, labetalol

## 2021-04-16 NOTE — Assessment & Plan Note (Signed)
On Vytorin 

## 2021-04-17 ENCOUNTER — Other Ambulatory Visit: Payer: Self-pay | Admitting: Internal Medicine

## 2021-04-17 MED ORDER — IRBESARTAN-HYDROCHLOROTHIAZIDE 150-12.5 MG PO TABS: 1.0000 | ORAL_TABLET | Freq: Every day | ORAL | 1 refills | Status: DC

## 2021-04-17 NOTE — Telephone Encounter (Signed)
    Patient requesting refill for zolpidem (AMBIEN) 10 MG tablet irbesartan-hydrochlorothiazide (AVALIDE) 150-12.5 MG tablet   Pharmacy [facility] [place] - [address]

## 2021-04-17 NOTE — Telephone Encounter (Signed)
Sent Irbesartan.. pls advise on Zolpidem.Marland KitchenJohny Chess

## 2021-04-19 MED ORDER — ZOLPIDEM TARTRATE 10 MG PO TABS: 10.0000 mg | ORAL_TABLET | Freq: Every day | ORAL | 1 refills | Status: DC

## 2021-05-09 NOTE — Progress Notes (Signed)
Cardiology Office Note:    Date:  05/11/2021   ID:  Andrew Essex., DOB 1946/08/01, MRN [id number]  PCP:  [name], MD  Cardiologist:  [name], MD  Referring MD: [name], MD   CC: follow up  History of Present Illness:    Andrew Holquin. is a 75 y.o. male with a hx of CAD with remote stent, paroxysmal atrial flutter who is seen for follow up. I initially met him 09/07/2019 as a new consult at the request of [name], [name], MD for the evaluation and management of CAD.  CV history: distal LAD stent, last cath 2011-01-29 Father died of MI age 28.  Cardiovascular risk factors: Prior clinical ASCVD: CAD with PCI to LAD. ?CVA in 01-29-06 (quarter sized bleed in his brain--lightheadedness/dizziness), unclear ?seizure event 01-29-2011 Comorbid conditions: hypertension, hyperlipidemia. No diabetes, chronic kidney disease:  Metabolic syndrome/Obesity: BMI 27 Chronic inflammatory conditions: none Tobacco use history: never Family history: father died age 18 from MI, 3 ppd smoker, overweight. Mother died age 46 of stroke. Brother has diabetes. 3 children, all heathly Prior cardiac testing: cath 2010, 2012. Echo 29-Jan-2014. MPI 1997, 20002002/02/282003-02-28 Exercise level: had been daily gym, up to 7 miles/10k steps, prior to Covid. Now walking only sporadically. No limitations.  Current diet: doesn't cook at home. Mostly chicken, fish, vegetables. Enjoys ice cream, pastry rarely.  Hypercholesterolemia: last LDL 124. Was told in the past his total cholesterol was over 400. Was happy that his total cholesterol is less than 200.  Today: We reviewed the series of events since last visit. Just after our visit, he went into atrial flutter while preparing for a colonoscopy. His blood pressure has also been elevated since that time.  He is now s/p cardioversion and has remained in sinus rhythm since. Lately he has not had episodes of faster heart rates, and states he rarely  feels his heart beat. His blood pressure is typically elevated when he has a clinic visit. He tried to monitor his blood pressure at home in 03/2021 but discontinued this due to inaccurate readings.   He is taking labetalol twice a day, 1 tablet in morning, and 1/2 tablet at night.  He denies any chest pain, shortness of breath, palpitations, or exertional symptoms. No headaches, lightheadedness, or syncope to report. Also has no lower extremity edema, orthopnea or PND.   Past Medical History:  Diagnosis Date   Allergic rhinitis, cause unspecified    Allergy    SEASONAL   Anxiety    Blood in stool    BPH (benign prostatic hyperplasia)    CAD (coronary artery disease)    DES mid lad 08/15/2009   Cancer ([place]) January 30, 2008   prostate   Chest pain, unspecified    Colon polyps    CVA (cerebrovascular accident due to intracerebral hemorrhage) [facility] 2007 Spring   History of prostate cancer Jan 30, 2008   Dr [name]   HTN (hypertension)    Hypercholesterolemia    Hyperlipidemia    Hypokalemia    Insomnia, persistent    Neoplasm of uncertain behavior of skin    Stroke [facility])    2006-01-29   Syncope    Tubular adenoma of colon 29-Jan-2009    Past Surgical History:  Procedure Laterality Date   ANGIOPLASTY     with stents   CARDIOVERSION N/A 04/13/2021   Procedure: CARDIOVERSION;  Surgeon: [name], MD;  Location: [facility];  Service: Cardiovascular;  Laterality: N/A;  COLONOSCOPY  2016   Larrie Kass   POLYPECTOMY     PROSTATECTOMY  2009   TONSILLECTOMY      Current Medications: Current Outpatient Medications on File Prior to Visit  Medication Sig   apixaban (ELIQUIS) 5 MG TABS tablet Take 1 tablet (5 mg total) by mouth 2 (two) times daily.   Cholecalciferol (VITAMIN D3) 2000 UNITS capsule Take 1 capsule (2,000 Units total) by mouth daily. (Patient taking differently: Take 2,000 Units by mouth daily. This changes daily the amount - usually 2000 to 3000 units)   irbesartan-hydrochlorothiazide  (AVALIDE) 150-12.5 MG tablet Take 1 tablet by mouth daily.   labetalol (NORMODYNE) 300 MG tablet Take 1 tablet (300 mg total) by mouth in the morning AND 0.5 tablets (150 mg total) every evening.   loratadine (CLARITIN) 10 MG tablet Take 10 mg by mouth daily.   LORazepam (ATIVAN) 0.5 MG tablet Take 1 tablet (0.5 mg total) by mouth 2 (two) times daily as needed for anxiety.   rosuvastatin (CRESTOR) 20 MG tablet TAKE 1 TABLET BY MOUTH DAILY (Patient taking differently: Take 20 mg by mouth daily.)   triamcinolone cream (KENALOG) 0.5 % Use tid prn (Patient taking differently: Apply 1 application topically 3 (three) times daily as needed (irritation). Use tid prn)   zolpidem (AMBIEN) 10 MG tablet Take 1 tablet (10 mg total) by mouth at bedtime.   No current facility-administered medications on file prior to visit.     Allergies:   Amlodipine, Iodine, and Shellfish allergy   Social History   Tobacco Use   Smoking status: Never   Smokeless tobacco: Never  Vaping Use   Vaping Use: Never used  Substance Use Topics   Alcohol use: Yes    Comment: glass of wine- occ  per pt - occ mixed drink    Drug use: No    Family History: family history includes Diabetes in his brother; Heart disease in his father; Stroke in his mother. There is no history of Colon polyps, Esophageal cancer, Rectal cancer, Stomach cancer, or Colon cancer.  ROS:   Please see the history of present illness.   Additional pertinent ROS otherwise unremarkable.   EKGs/Labs/Other Studies Reviewed:    The following studies were reviewed today:  Echo 04/11/2021: 1. Left ventricular ejection fraction, by estimation, is 45 to 50%. The  left ventricle has mildly decreased function. The left ventricle  demonstrates global hypokinesis. Left ventricular diastolic parameters  were normal.   2. Right ventricular systolic function is normal. The right ventricular  size is mildly enlarged. There is normal pulmonary artery systolic   pressure.   3. Left atrial size was severely dilated.   4. Right atrial size was moderately dilated.   5. The mitral valve is degenerative. No evidence of mitral valve  regurgitation. No evidence of mitral stenosis.   6. The aortic valve is calcified. There is mild calcification of the  aortic valve. There is mild thickening of the aortic valve. Aortic valve  regurgitation is mild. Mild to moderate aortic valve  sclerosis/calcification is present, without any evidence  of aortic stenosis. Aortic regurgitation PHT measures 351 msec.   7. The inferior vena cava is normal in size with greater than 50%  respiratory variability, suggesting right atrial pressure of 3 mmHg.   US Venous Right LE 06/16/2020: IMPRESSION: No evidence of DVT within the right lower extremity.  Cath 07/31/2011 Left main coronary artery was a very short segment.  There was near side-by-side separate ostia.  No significant disease.  The proximal LAD had 30% to 40% tubular disease.  The mid LAD had 50% multiple discrete lesions.  This was unchanged from the previous catheterization done in 2010.  There was a widely patent stent in the distal LAD.  The LAD was a large artery that wrapped the apex.   The LAD actually supplied most of the PDA territory, so it was difficult to know what dominance the patient was.  Right coronary artery was small and had no significant disease.   The circumflex coronary artery was somewhat larger than right coronary artery, but again the PDA was mostly supplied by the distal LAD.  There was 20% to 30% ostial lesions proximally.  The ostium of the first obtuse marginal branch had a 40% lesion.   The patient also had a very large diagonal branch, which did not have significant disease and provided most of the anterolateral wall.   Left ventriculography.  Left ventriculography was normal.  EF was 60%. There was no gradient across the aortic valve and no MR.  Aortic pressure was 132/71.  LV  pressure was somewhat inaccurate with the LV tracing at 160/11.   IMPRESSION:  The patient has continued moderate disease in the mid LAD after the diagonal takeoff.  I closely reviewed his previous film from 2010 and there has been no change in this disease.  His stent is widely patent and he does not have any new disease in the right coronary artery or circ.  Clinically when I talked to the patient it sounded like he had a seizure.  There is no antecedent chest pain, palpitations, or cardiac symptoms.   The patient was fairly insistent on having a diagnostic cath rather than a stress test.   I have encouraged the patient to follow up with Neurology.  We have already arranged for him to have an EEG, MRI with MRA, and neurological followup.   Given the patency of the stent, normal LV function would be highly unlikely that his "seizure event was cardiac in etiology."  At the end of the case, a TR band was placed with good hemostasis.  He tolerated the procedure well.  Cath done by Dr. Olevia Perches. During the procedure, he had a contrast reaction to the dye (reported prior iodine/shellfish allergy), was treated for this. Received PCI to LAD with stenosis form 95% to 0%.   EKG:  EKG is personally reviewed.   05/11/2021: sinus bradycardia, 54 bpm 09/22/20: sinus bradycardia, LAFB at 58 bpm  Recent Labs: 03/15/2021: ALT 34; Magnesium 1.9; TSH 2.73 04/09/2021: Hemoglobin 15.6; Platelets 122 04/12/2021: BUN 19; Creatinine, Ser 1.30; Potassium 3.9; Sodium 140  Recent Lipid Panel    Component Value Date/Time   CHOL 179 10/18/2020 0949   TRIG 79.0 10/18/2020 0949   HDL 50.70 10/18/2020 0949   CHOLHDL 4 10/18/2020 0949   VLDL 15.8 10/18/2020 0949   LDLCALC 113 (H) 10/18/2020 0949   LDLDIRECT 296.1 08/12/2013 0936    Physical Exam:    VS:  BP (!) 162/74 (BP Location: Left Arm, Patient Position: Sitting)   Pulse (!) 54   Ht 5' 8" (1.727 m)   Wt 173 lb (78.5 kg)   SpO2 98%   BMI 26.30 kg/m    Recheck 170/78  Wt Readings from Last 3 Encounters:  05/11/21 173 lb (78.5 kg)  04/16/21 171 lb (77.6 kg)  04/13/21 167 lb (75.8 kg)    GEN: Well nourished, well developed in no acute distress HEENT: Normal,  moist mucous membranes NECK: No JVD CARDIAC: regular rhythm, normal S1 and S2, no rubs or gallops. No murmur. VASCULAR: Radial and DP pulses 2+ bilaterally. No carotid bruits RESPIRATORY:  Clear to auscultation without rales, wheezing or rhonchi  ABDOMEN: Soft, non-tender, non-distended MUSCULOSKELETAL:  Ambulates independently SKIN: Warm and dry, no edema NEUROLOGIC:  Alert and oriented x 3. No focal neuro deficits noted. PSYCHIATRIC:  Normal affect    ASSESSMENT:    1. Atrial flutter, unspecified type ([place])   2. Coronary artery disease involving native coronary artery of native heart without angina pectoris   3. Essential hypertension   4. Pure hypercholesterolemia   5. Cardiac risk counseling    PLAN:    Atrial flutter: new diagnosis recently, now s/p cardioversion and maintaining sinus rhythm -CHA2DS2/VAS Stroke Risk Points= 4  -no bleeding issues -with bradycardia, will stop standing diltiazem. Recommended he keep the bottle and use only if flutter returns. This will allow [place] to use amlodipine for BP if needed at follow up  CAD with history of prior stent: asymptomatic. Reviewed guidelines for management -continue aspirin 81 mg, no issues with bleeding -continue rosuvastatin 20 mg daily -asymptomatic, -discussed LDL goal, see below -reviewed red flag warning signs that need immediate medical attention  Hypertension: Overall goal <130/80 -elevated today, though trending better on recheck -we discussed options today. He will monitor home blood pressures and contact us in several weeks with readings -continue irbesartan-HCTZ 150-12.5 mg daily. We discussed that taking this at night may exacerbate nocturia, but he feels this is manageable and current regimen is  working well -continue labetalol, has been on long term. Adjusted dosing today based on how he is actually taking the medication. -has been on amlodipine in the past if additional agent needed in the future. With stopping diltiazem, this may be a good option. Alternative would be to increase irbesartan-HCTZ dose to maximum dosing.  Hypercholesterolemia: LDL goal <70 -reviewed lipids from 10/2020. LDL 113. Discussed guidelines/recommendations for LDL <70. Discussed options today. After shared decision making, will not uptitrate or adjust regimen today per patient preference -continue rosuvastatin 20 mg daily  Cardiac risk counseling and prevention recommendations: -recommend heart healthy/Mediterranean diet, with whole grains, fruits, vegetable, fish, lean meats, nuts, and olive oil. Limit salt. -recommend moderate walking, 3-5 times/week for 30-50 minutes each session. Aim for at least 150 minutes.week. Goal should be pace of 3 miles/hours, or walking 1.5 miles in 30 minutes -recommend avoidance of tobacco products. Avoid excess alcohol.  Plan for follow up: 6 months or sooner as needed once blood pressure controlled. He does not wish to be seen at the [facility] office, and he would prefer to stay at [facility] and see Dr. Margaretann Loveless.  [name], MD, PhD, [facility]   Medication Adjustments/Labs and Tests Ordered: Current medicines are reviewed at length with the patient today.  Concerns regarding medicines are outlined above.  Orders Placed This Encounter  Procedures   EKG 12-Lead   No orders of the defined types were placed in this encounter.   Patient Instructions  Medication Instructions:  Stop diltiazem 120 mg daily.  *If you need a refill on your cardiac medications before your next appointment, please call your pharmacy*   Lab Work: None ordered today   Testing/Procedures: None ordered today   Follow-Up: At [facility], you and your  health needs are our priority.  As part of our continuing mission to provide you with exceptional heart care, we have created designated  Provider Care Teams.  These Care Teams include your primary Cardiologist (physician) and Advanced Practice Providers (APPs -  Physician Assistants and Nurse Practitioners) who all work together to provide you with the care you need, when you need it.  We recommend signing up for the patient portal called "MyChart".  Sign up information is provided on this After Visit Summary.  MyChart is used to connect with patients for Virtual Visits (Telemedicine).  Patients are able to view lab/test results, encounter notes, upcoming appointments, etc.  Non-urgent messages can be sent to your provider as well.   To learn more about what you can do with MyChart, go to [email or web address].    Your next appointment:   6 month(s)  The format for your next appointment:   In Person  Provider:   Cherlynn Kaiser, MD    -how to check blood pressure:  -sit comfortably in a chair, feet uncrossed and flat on floor, for 5-10 minutes  -arm ideally should rest at the level of the heart. However, arm should be relaxed and not tense (for example, do not hold the arm up unsupported)  -avoid exercise, caffeine, and tobacco for at least 30 minutes prior to BP reading  -don't take BP cuff reading over clothes (always place on skin directly)  -I prefer to know how well the medication is working, so I would like you to take your readings 1-2 hours after taking your blood pressure medication if possible   Please call office if blood pressure continues to remain greater than 130/80.   I,Mathew Stumpf,acting as a Education administrator for [employer], MD.,have documented all relevant documentation on the behalf of [name], MD,as directed by  [name], MD while in the presence of [name], MD.  I, [name], MD, have reviewed all documentation  for this visit. The documentation on 05/11/21 for the exam, diagnosis, procedures, and orders are all accurate and complete.   Signed, [name], MD PhD 05/11/2021    [place]

## 2021-05-11 ENCOUNTER — Other Ambulatory Visit: Payer: Self-pay

## 2021-05-11 ENCOUNTER — Ambulatory Visit (INDEPENDENT_AMBULATORY_CARE_PROVIDER_SITE_OTHER): Payer: Medicare PPO | Admitting: Cardiology

## 2021-05-11 ENCOUNTER — Encounter: Payer: Self-pay | Admitting: Cardiology

## 2021-05-11 VITALS — BP 162/74 | HR 54 | Ht 68.0 in | Wt 173.0 lb

## 2021-05-11 DIAGNOSIS — I10 Essential (primary) hypertension: Secondary | ICD-10-CM | POA: Diagnosis not present

## 2021-05-11 DIAGNOSIS — E7800 Pure hypercholesterolemia, unspecified: Secondary | ICD-10-CM | POA: Diagnosis not present

## 2021-05-11 DIAGNOSIS — I4892 Unspecified atrial flutter: Secondary | ICD-10-CM

## 2021-05-11 DIAGNOSIS — I2510 Atherosclerotic heart disease of native coronary artery without angina pectoris: Secondary | ICD-10-CM | POA: Diagnosis not present

## 2021-05-11 DIAGNOSIS — Z7189 Other specified counseling: Secondary | ICD-10-CM | POA: Diagnosis not present

## 2021-05-11 NOTE — Patient Instructions (Addendum)
Medication Instructions:  Stop diltiazem 120 mg daily.  *If you need a refill on your cardiac medications before your next appointment, please call your pharmacy*   Lab Work: None ordered today   Testing/Procedures: None ordered today   Follow-Up: At [facility], you and your health needs are our priority.  As part of our continuing mission to provide you with exceptional heart care, we have created designated Provider Care Teams.  These Care Teams include your primary Cardiologist (physician) and Advanced Practice Providers (APPs -  Physician Assistants and Nurse Practitioners) who all work together to provide you with the care you need, when you need it.  We recommend signing up for the patient portal called "MyChart".  Sign up information is provided on this After Visit Summary.  MyChart is used to connect with patients for Virtual Visits (Telemedicine).  Patients are able to view lab/test results, encounter notes, upcoming appointments, etc.  Non-urgent messages can be sent to your provider as well.   To learn more about what you can do with MyChart, go to [email or web address].    Your next appointment:   6 month(s)  The format for your next appointment:   In Person  Provider:   Cherlynn Kaiser, MD    -how to check blood pressure:  -sit comfortably in a chair, feet uncrossed and flat on floor, for 5-10 minutes  -arm ideally should rest at the level of the heart. However, arm should be relaxed and not tense (for example, do not hold the arm up unsupported)  -avoid exercise, caffeine, and tobacco for at least 30 minutes prior to BP reading  -don't take BP cuff reading over clothes (always place on skin directly)  -I prefer to know how well the medication is working, so I would like you to take your readings 1-2 hours after taking your blood pressure medication if possible   Please call office if blood pressure continues to remain greater than 130/80.

## 2021-05-21 ENCOUNTER — Other Ambulatory Visit: Payer: Self-pay | Admitting: Cardiology

## 2021-07-05 DIAGNOSIS — M271 Giant cell granuloma, central: Secondary | ICD-10-CM | POA: Diagnosis not present

## 2021-07-05 DIAGNOSIS — R69 Illness, unspecified: Secondary | ICD-10-CM | POA: Diagnosis not present

## 2021-08-16 ENCOUNTER — Telehealth: Payer: Self-pay | Admitting: Internal Medicine

## 2021-08-16 ENCOUNTER — Other Ambulatory Visit: Payer: Self-pay | Admitting: Internal Medicine

## 2021-08-16 DIAGNOSIS — E785 Hyperlipidemia, unspecified: Secondary | ICD-10-CM

## 2021-08-16 DIAGNOSIS — I10 Essential (primary) hypertension: Secondary | ICD-10-CM

## 2021-08-16 DIAGNOSIS — Z8546 Personal history of malignant neoplasm of prostate: Secondary | ICD-10-CM

## 2021-08-16 NOTE — Telephone Encounter (Signed)
   Patient requesting order to get labs done at [facility] prior to 9/20 appointment

## 2021-08-16 NOTE — Telephone Encounter (Signed)
Patient calling to see if lab order had been placed  Patient has appt scheduled 09.19 & wants to have labs done before appt

## 2021-08-17 NOTE — Telephone Encounter (Signed)
OK. Thanks.

## 2021-08-17 NOTE — Telephone Encounter (Signed)
Notified pt MD ok lab made appt for Monday 9/19.Marland KitchenJohny Joseph

## 2021-08-20 ENCOUNTER — Other Ambulatory Visit (INDEPENDENT_AMBULATORY_CARE_PROVIDER_SITE_OTHER): Payer: Medicare PPO

## 2021-08-20 ENCOUNTER — Other Ambulatory Visit: Payer: Medicare PPO

## 2021-08-20 DIAGNOSIS — E785 Hyperlipidemia, unspecified: Secondary | ICD-10-CM

## 2021-08-20 DIAGNOSIS — I10 Essential (primary) hypertension: Secondary | ICD-10-CM

## 2021-08-20 DIAGNOSIS — Z8546 Personal history of malignant neoplasm of prostate: Secondary | ICD-10-CM

## 2021-08-20 LAB — CBC WITH DIFFERENTIAL/PLATELET
Basophils Absolute: 0 10*3/uL (ref 0.0–0.1)
Basophils Relative: 0.8 % (ref 0.0–3.0)
Eosinophils Absolute: 0.1 10*3/uL (ref 0.0–0.7)
Eosinophils Relative: 3.1 % (ref 0.0–5.0)
HCT: 43.3 % (ref 39.0–52.0)
Hemoglobin: 15 g/dL (ref 13.0–17.0)
Lymphocytes Relative: 22.1 % (ref 12.0–46.0)
Lymphs Abs: 1 10*3/uL (ref 0.7–4.0)
MCHC: 34.7 g/dL (ref 30.0–36.0)
MCV: 90.6 fl (ref 78.0–100.0)
Monocytes Absolute: 0.4 10*3/uL (ref 0.1–1.0)
Monocytes Relative: 9.8 % (ref 3.0–12.0)
Neutro Abs: 2.8 10*3/uL (ref 1.4–7.7)
Neutrophils Relative %: 64.2 % (ref 43.0–77.0)
Platelets: 110 10*3/uL — ABNORMAL LOW (ref 150.0–400.0)
RBC: 4.78 Mil/uL (ref 4.22–5.81)
RDW: 14.6 % (ref 11.5–15.5)
WBC: 4.4 10*3/uL (ref 4.0–10.5)

## 2021-08-20 LAB — COMPREHENSIVE METABOLIC PANEL
ALT: 23 U/L (ref 0–53)
AST: 23 U/L (ref 0–37)
Albumin: 4.3 g/dL (ref 3.5–5.2)
Alkaline Phosphatase: 66 U/L (ref 39–117)
BUN: 14 mg/dL (ref 6–23)
CO2: 29 mEq/L (ref 19–32)
Calcium: 9.4 mg/dL (ref 8.4–10.5)
Chloride: 104 mEq/L (ref 96–112)
Creatinine, Ser: 1.15 mg/dL (ref 0.40–1.50)
GFR: 61.9 mL/min (ref >60.00)
Glucose, Bld: 106 mg/dL — ABNORMAL HIGH (ref 70–99)
Potassium: 3.7 mEq/L (ref 3.5–5.1)
Sodium: 141 mEq/L (ref 135–145)
Total Bilirubin: 1 mg/dL (ref 0.2–1.2)
Total Protein: 6.8 g/dL (ref 6.0–8.3)

## 2021-08-20 LAB — LIPID PANEL
Cholesterol: 198 mg/dL (ref 0–200)
HDL: 49.9 mg/dL (ref >39.00)
LDL Cholesterol: 134 mg/dL — ABNORMAL HIGH (ref 0–99)
NonHDL: 148.57
Total CHOL/HDL Ratio: 4
Triglycerides: 74 mg/dL (ref 0.0–149.0)
VLDL: 14.8 mg/dL (ref 0.0–40.0)

## 2021-08-20 LAB — URINALYSIS
Bilirubin Urine: NEGATIVE
Hgb urine dipstick: NEGATIVE
Ketones, ur: NEGATIVE
Leukocytes,Ua: NEGATIVE
Nitrite: NEGATIVE
Specific Gravity, Urine: 1.02 (ref 1.000–1.030)
Urine Glucose: NEGATIVE
Urobilinogen, UA: 0.2 (ref 0.0–1.0)
pH: 6 (ref 5.0–8.0)

## 2021-08-20 LAB — PSA: PSA: 0 ng/mL — ABNORMAL LOW (ref 0.10–4.00)

## 2021-08-20 LAB — TSH: TSH: 2.38 u[IU]/mL (ref 0.35–5.50)

## 2021-08-21 ENCOUNTER — Other Ambulatory Visit: Payer: Self-pay

## 2021-08-21 ENCOUNTER — Ambulatory Visit: Payer: Medicare PPO | Admitting: Internal Medicine

## 2021-08-21 ENCOUNTER — Ambulatory Visit (INDEPENDENT_AMBULATORY_CARE_PROVIDER_SITE_OTHER): Payer: Medicare PPO

## 2021-08-21 ENCOUNTER — Encounter: Payer: Self-pay | Admitting: Internal Medicine

## 2021-08-21 DIAGNOSIS — I4892 Unspecified atrial flutter: Secondary | ICD-10-CM

## 2021-08-21 DIAGNOSIS — I10 Essential (primary) hypertension: Secondary | ICD-10-CM | POA: Diagnosis not present

## 2021-08-21 DIAGNOSIS — Z8546 Personal history of malignant neoplasm of prostate: Secondary | ICD-10-CM

## 2021-08-21 DIAGNOSIS — E785 Hyperlipidemia, unspecified: Secondary | ICD-10-CM | POA: Diagnosis not present

## 2021-08-21 DIAGNOSIS — E876 Hypokalemia: Secondary | ICD-10-CM | POA: Diagnosis not present

## 2021-08-21 DIAGNOSIS — Z8679 Personal history of other diseases of the circulatory system: Secondary | ICD-10-CM | POA: Diagnosis not present

## 2021-08-21 DIAGNOSIS — I700 Atherosclerosis of aorta: Secondary | ICD-10-CM | POA: Diagnosis not present

## 2021-08-21 MED ORDER — LORAZEPAM 0.5 MG PO TABS: 0.5000 mg | ORAL_TABLET | Freq: Two times a day (BID) | ORAL | 0 refills | Status: DC | PRN

## 2021-08-21 MED ORDER — ZOLPIDEM TARTRATE 10 MG PO TABS: 10.0000 mg | ORAL_TABLET | Freq: Every day | ORAL | 1 refills | Status: DC

## 2021-08-21 MED ORDER — APIXABAN 5 MG PO TABS: 5.0000 mg | ORAL_TABLET | Freq: Two times a day (BID) | ORAL | 3 refills | Status: DC

## 2021-08-21 MED ORDER — IRBESARTAN-HYDROCHLOROTHIAZIDE 150-12.5 MG PO TABS: 1.0000 | ORAL_TABLET | Freq: Every day | ORAL | 3 refills | Status: DC

## 2021-08-21 MED ORDER — LABETALOL HCL 300 MG PO TABS: 300.0000 mg | ORAL_TABLET | Freq: Two times a day (BID) | ORAL | 3 refills | Status: DC

## 2021-08-21 NOTE — Assessment & Plan Note (Signed)
Check CMET. 

## 2021-08-21 NOTE — Assessment & Plan Note (Signed)
PSA 0 

## 2021-08-21 NOTE — Assessment & Plan Note (Addendum)
Not well controlled On [place]. Increase labetalol to a whole tab bid

## 2021-08-21 NOTE — Progress Notes (Signed)
Subjective:  Patient ID: Andrew Essex., male    DOB: [birth year]  Age: 75 y.o. MRN: [id number]  CC: Follow-up (4 month f/u)   HPI Andrew Essex. presents for HTN, prostate cancer, A fib f/u  Outpatient Medications Prior to Visit  Medication Sig Dispense Refill   Cholecalciferol (VITAMIN D3) 2000 UNITS capsule Take 1 capsule (2,000 Units total) by mouth daily. (Patient taking differently: Take 2,000 Units by mouth daily. This changes daily the amount - usually 2000 to 3000 units) 100 capsule 3   loratadine (CLARITIN) 10 MG tablet Take 10 mg by mouth daily.     rosuvastatin (CRESTOR) 20 MG tablet Take 1 tablet (20 mg total) by mouth daily. 90 tablet 3   triamcinolone cream (KENALOG) 0.5 % Use tid prn (Patient taking differently: Apply 1 application topically 3 (three) times daily as needed (irritation). Use tid prn) 60 g 1   apixaban (ELIQUIS) 5 MG TABS tablet Take 1 tablet (5 mg total) by mouth 2 (two) times daily. 180 tablet 3   irbesartan-hydrochlorothiazide (AVALIDE) 150-12.5 MG tablet Take 1 tablet by mouth daily. 90 tablet 1   labetalol (NORMODYNE) 300 MG tablet TAKE 1/2 TABLET BY MOUTH TWICE DAILY 180 tablet 1   zolpidem (AMBIEN) 10 MG tablet Take 1 tablet (10 mg total) by mouth at bedtime. 90 tablet 1   LORazepam (ATIVAN) 0.5 MG tablet Take 1 tablet (0.5 mg total) by mouth 2 (two) times daily as needed for anxiety. (Patient not taking: Reported on 08/21/2021) 30 tablet 1   No facility-administered medications prior to visit.    ROS: Review of Systems  Constitutional:  Negative for appetite change, fatigue and unexpected weight change.  HENT:  Negative for congestion, nosebleeds, sneezing, sore throat and trouble swallowing.   Eyes:  Negative for itching and visual disturbance.  Respiratory:  Negative for cough.   Cardiovascular:  Negative for chest pain, palpitations and leg swelling.  Gastrointestinal:  Negative for abdominal distention, blood in stool, diarrhea and  nausea.  Genitourinary:  Negative for frequency and hematuria.  Musculoskeletal:  Negative for back pain, gait problem, joint swelling and neck pain.  Skin:  Negative for rash.  Neurological:  Negative for dizziness, tremors, speech difficulty and weakness.  Psychiatric/Behavioral:  Negative for agitation, dysphoric mood, sleep disturbance and suicidal ideas. The patient is not nervous/anxious.    Objective:  There were no vitals taken for this visit.  BP Readings from Last 3 Encounters:  05/11/21 (!) 162/74  04/16/21 130/75  04/13/21 139/67    Wt Readings from Last 3 Encounters:  05/11/21 173 lb (78.5 kg)  04/16/21 171 lb (77.6 kg)  04/13/21 167 lb (75.8 kg)    Physical Exam Constitutional:      General: He is not in acute distress.    Appearance: He is well-developed.     Comments: NAD  Eyes:     Conjunctiva/sclera: Conjunctivae normal.     Pupils: Pupils are equal, round, and reactive to light.  Neck:     Thyroid: No thyromegaly.     Vascular: No JVD.  Cardiovascular:     Rate and Rhythm: Normal rate. Rhythm irregular.     Heart sounds: Normal heart sounds. No murmur heard.   No friction rub. No gallop.  Pulmonary:     Effort: Pulmonary effort is normal. No respiratory distress.     Breath sounds: Normal breath sounds. No wheezing or rales.  Chest:     Chest wall: No tenderness.  Abdominal:     General: Bowel sounds are normal. There is no distension.     Palpations: Abdomen is soft. There is no mass.     Tenderness: There is no abdominal tenderness. There is no guarding or rebound.  Musculoskeletal:        General: No tenderness. Normal range of motion.     Cervical back: Normal range of motion.  Lymphadenopathy:     Cervical: No cervical adenopathy.  Skin:    General: Skin is warm and dry.     Findings: No rash.  Neurological:     Mental Status: He is alert and oriented to person, place, and time.     Cranial Nerves: No cranial nerve deficit.     Motor:  No abnormal muscle tone.     Coordination: Coordination normal.     Gait: Gait normal.     Deep Tendon Reflexes: Reflexes are normal and symmetric.  Psychiatric:        Behavior: Behavior normal.        Thought Content: Thought content normal.        Judgment: Judgment normal.    Lab Results  Component Value Date   WBC 4.4 08/20/2021   HGB 15.0 08/20/2021   HCT 43.3 08/20/2021   PLT 110.0 (L) 08/20/2021   GLUCOSE 106 (H) 08/20/2021   CHOL 198 08/20/2021   TRIG 74.0 08/20/2021   HDL 49.90 08/20/2021   LDLDIRECT 296.1 08/12/2013   LDLCALC 134 (H) 08/20/2021   ALT 23 08/20/2021   AST 23 08/20/2021   NA 141 08/20/2021   K 3.7 08/20/2021   CL 104 08/20/2021   CREATININE 1.15 08/20/2021   BUN 14 08/20/2021   CO2 29 08/20/2021   TSH 2.38 08/20/2021   PSA 0.00 (L) 08/20/2021   INR 0.99 07/31/2011   HGBA1C 5.8 03/09/2014    ECHOCARDIOGRAM COMPLETE  Result Date: 04/11/2021    ECHOCARDIOGRAM REPORT   Patient Name:   Andrew Joseph. Date of Exam: 04/11/2021 Medical Rec #:  [id number]           Height:       68.0 in Accession #:    [id number]          Weight:       172.8 lb Date of Birth:  1946/02/13           BSA:          1.921 m Patient Age:    29 years            BP:           152/81 mmHg Patient Gender: M                   HR:           65 bpm. Exam Location:  [facility] Procedure: 2D Echo, 3D Echo, Cardiac Doppler and Color Doppler Indications:    I48.4 Atypical Atrial Flutter  History:        Patient has prior history of Echocardiogram examinations, most                 recent 08/19/2014. CAD and Previous Myocardial Infarction,                 Stroke, Arrythmias:Atrial Fibrillation and Atrial Flutter,                 Signs/Symptoms:Chest Pain and Syncope; Risk Factors:Family  History of Coronary Artery Disease, Hypertension and                 Dyslipidemia.  Sonographer:    Deliah Boston RDCS Referring Phys: [id number] [place]  1. Left  ventricular ejection fraction, by estimation, is 45 to 50%. The left ventricle has mildly decreased function. The left ventricle demonstrates global hypokinesis. Left ventricular diastolic parameters were normal.  2. Right ventricular systolic function is normal. The right ventricular size is mildly enlarged. There is normal pulmonary artery systolic pressure.  3. Left atrial size was severely dilated.  4. Right atrial size was moderately dilated.  5. The mitral valve is degenerative. No evidence of mitral valve regurgitation. No evidence of mitral stenosis.  6. The aortic valve is calcified. There is mild calcification of the aortic valve. There is mild thickening of the aortic valve. Aortic valve regurgitation is mild. Mild to moderate aortic valve sclerosis/calcification is present, without any evidence of aortic stenosis. Aortic regurgitation PHT measures 351 msec.  7. The inferior vena cava is normal in size with greater than 50% respiratory variability, suggesting right atrial pressure of 3 mmHg. FINDINGS  Left Ventricle: Left ventricular ejection fraction, by estimation, is 45 to 50%. The left ventricle has mildly decreased function. The left ventricle demonstrates global hypokinesis. 3D left ventricular ejection fraction analysis performed but not reported based on interpreter judgement due to suboptimal quality. The left ventricular internal cavity size was normal in size. There is no left ventricular hypertrophy. Left ventricular diastolic parameters were normal. Right Ventricle: The right ventricular size is mildly enlarged. No increase in right ventricular wall thickness. Right ventricular systolic function is normal. There is normal pulmonary artery systolic pressure. The tricuspid regurgitant velocity is 2.58  m/s, and with an assumed right atrial pressure of 3 mmHg, the estimated right ventricular systolic pressure is 02.4 mmHg. Left Atrium: Left atrial size was severely dilated. Right Atrium: Right  atrial size was moderately dilated. Pericardium: There is no evidence of pericardial effusion. Mitral Valve: The mitral valve is degenerative in appearance. There is mild thickening of the mitral valve leaflet(s). There is mild calcification of the mitral valve leaflet(s). Mild mitral annular calcification. No evidence of mitral valve regurgitation. No evidence of mitral valve stenosis. Tricuspid Valve: The tricuspid valve is normal in structure. Tricuspid valve regurgitation is mild . No evidence of tricuspid stenosis. Aortic Valve: The aortic valve is calcified. There is mild calcification of the aortic valve. There is mild thickening of the aortic valve. Aortic valve regurgitation is mild. Aortic regurgitation PHT measures 351 msec. Mild to moderate aortic valve sclerosis/calcification is present, without any evidence of aortic stenosis. Pulmonic Valve: The pulmonic valve was normal in structure. Pulmonic valve regurgitation is trivial. No evidence of pulmonic stenosis. Aorta: The aortic root is normal in size and structure. Venous: The inferior vena cava is normal in size with greater than 50% respiratory variability, suggesting right atrial pressure of 3 mmHg. IAS/Shunts: No atrial level shunt detected by color flow Doppler.  LEFT VENTRICLE PLAX 2D LVIDd:         5.05 cm  Diastology LVIDs:         3.90 cm  LV e' medial:    7.18 cm/s LV PW:         1.05 cm  LV E/e' medial:  10.8 LV IVS:        0.75 cm  LV e' lateral:   12.40 cm/s LVOT diam:     2.00 cm  LV E/e' lateral: 6.3 LV SV:         73 LV SV Index:   38 LVOT Area:     3.14 cm                          3D Volume EF:                         3D EF:        55 %                         LV EDV:       163 ml                         LV ESV:       73 ml                         LV SV:        90 ml RIGHT VENTRICLE RV S prime:     13.60 cm/s TAPSE (M-mode): 2.0 cm LEFT ATRIUM              Index       RIGHT ATRIUM           Index LA diam:        4.50 cm  2.34 cm/m  RA  Area:     28.00 cm LA Vol (A2C):   124.0 ml 64.56 ml/m RA Volume:   95.10 ml  49.51 ml/m LA Vol (A4C):   106.0 ml 55.19 ml/m LA Biplane Vol: 118.0 ml 61.44 ml/m  AORTIC VALVE LVOT Vmax:   110.50 cm/s LVOT Vmean:  69.300 cm/s LVOT VTI:    0.232 m AI PHT:      351 msec  AORTA Ao Root diam: 3.10 cm Ao Asc diam:  3.20 cm MITRAL VALVE               TRICUSPID VALVE MV Area (PHT): cm         TR Peak grad:   26.6 mmHg MV Decel Time: 293 msec    TR Vmax:        258.00 cm/s MV E velocity: 77.75 cm/s MV A velocity: 38.60 cm/s  SHUNTS MV E/A ratio:  2.01        Systemic VTI:  0.23 m                            Systemic Diam: 2.00 cm Candee Furbish MD Electronically signed by Candee Furbish MD Signature Date/Time: 04/11/2021/11:20:41 AM    Final     Assessment & Plan:   Problem List Items Addressed This Visit     Atrial flutter ([place])    Continue on Eliquis today. S/p cardioversion CXR      Relevant Medications   irbesartan-hydrochlorothiazide (AVALIDE) 150-12.5 MG tablet   apixaban (ELIQUIS) 5 MG TABS tablet   labetalol (NORMODYNE) 300 MG tablet   Other Relevant Orders   DG Chest 2 View   Dyslipidemia    Cont on Vytorin      Essential hypertension    Not well controlled On Avalide. Increase labetalol to a whole tab bid      Relevant Medications   irbesartan-hydrochlorothiazide (AVALIDE) 150-12.5 MG tablet   apixaban (ELIQUIS)  5 MG TABS tablet   labetalol (NORMODYNE) 300 MG tablet   Hypokalemia    Check CMET      PROSTATE CANCER, HX OF    PSA 0          [name], MD

## 2021-08-21 NOTE — Assessment & Plan Note (Signed)
Cont on Vytorin 

## 2021-08-21 NOTE — Assessment & Plan Note (Addendum)
Continue on Eliquis today. S/p cardioversion CXR

## 2021-08-23 ENCOUNTER — Encounter: Payer: Self-pay | Admitting: Internal Medicine

## 2021-08-23 DIAGNOSIS — I700 Atherosclerosis of aorta: Secondary | ICD-10-CM | POA: Insufficient documentation

## 2021-08-23 NOTE — Assessment & Plan Note (Signed)
Continue on statins-Crestor

## 2021-10-02 DIAGNOSIS — K9289 Other specified diseases of the digestive system: Secondary | ICD-10-CM | POA: Diagnosis not present

## 2021-10-02 DIAGNOSIS — K620 Anal polyp: Secondary | ICD-10-CM | POA: Diagnosis not present

## 2021-10-02 DIAGNOSIS — Z86010 Personal history of colonic polyps: Secondary | ICD-10-CM | POA: Diagnosis not present

## 2021-10-05 DIAGNOSIS — Z86010 Personal history of colonic polyps: Secondary | ICD-10-CM | POA: Diagnosis not present

## 2021-10-05 DIAGNOSIS — K648 Other hemorrhoids: Secondary | ICD-10-CM | POA: Diagnosis not present

## 2021-10-05 DIAGNOSIS — K626 Ulcer of anus and rectum: Secondary | ICD-10-CM | POA: Diagnosis not present

## 2021-10-05 DIAGNOSIS — Z1211 Encounter for screening for malignant neoplasm of colon: Secondary | ICD-10-CM | POA: Diagnosis not present

## 2021-10-05 DIAGNOSIS — K6289 Other specified diseases of anus and rectum: Secondary | ICD-10-CM | POA: Diagnosis not present

## 2021-10-05 DIAGNOSIS — I4892 Unspecified atrial flutter: Secondary | ICD-10-CM | POA: Diagnosis not present

## 2021-10-08 ENCOUNTER — Ambulatory Visit (HOSPITAL_BASED_OUTPATIENT_CLINIC_OR_DEPARTMENT_OTHER): Payer: Medicare PPO | Admitting: Cardiology

## 2021-10-08 ENCOUNTER — Encounter (HOSPITAL_BASED_OUTPATIENT_CLINIC_OR_DEPARTMENT_OTHER): Payer: Self-pay | Admitting: Cardiology

## 2021-10-08 ENCOUNTER — Other Ambulatory Visit: Payer: Self-pay

## 2021-10-08 VITALS — BP 140/70 | HR 58 | Ht 68.0 in | Wt 174.7 lb

## 2021-10-08 DIAGNOSIS — I4892 Unspecified atrial flutter: Secondary | ICD-10-CM

## 2021-10-08 DIAGNOSIS — E7800 Pure hypercholesterolemia, unspecified: Secondary | ICD-10-CM | POA: Diagnosis not present

## 2021-10-08 DIAGNOSIS — Z955 Presence of coronary angioplasty implant and graft: Secondary | ICD-10-CM

## 2021-10-08 DIAGNOSIS — Z7189 Other specified counseling: Secondary | ICD-10-CM

## 2021-10-08 DIAGNOSIS — I10 Essential (primary) hypertension: Secondary | ICD-10-CM | POA: Diagnosis not present

## 2021-10-08 DIAGNOSIS — I2510 Atherosclerotic heart disease of native coronary artery without angina pectoris: Secondary | ICD-10-CM | POA: Diagnosis not present

## 2021-10-08 NOTE — Progress Notes (Signed)
Cardiology Office Note:    Date:  10/08/2021   ID:  Andrew Joseph., DOB 1945/12/10, MRN [id number]  PCP:  [name], MD  Cardiologist:  [name], MD  Referring MD: [name], MD   CC: follow up  History of Present Illness:    Andrew Joseph. is a 75 y.o. male with a hx of CAD with remote stent, paroxysmal atrial flutter s/p cardioversion who is seen for follow up. I initially met him 09/07/2019 as a new consult at the request of [name], [name], MD for the evaluation and management of CAD.  CV history: distal LAD stent, last cath 2011-02-10 Father died of MI age 26.  Cardiovascular risk factors: Prior clinical ASCVD: CAD with PCI to LAD. ?CVA in 2006/02/10 (quarter sized bleed in his brain--lightheadedness/dizziness), unclear ?seizure event 2011-02-10 Comorbid conditions: hypertension, hyperlipidemia. No diabetes, chronic kidney disease:  Metabolic syndrome/Obesity: BMI 27 Chronic inflammatory conditions: none Tobacco use history: never Family history: father died age 20 from MI, 3 ppd smoker, overweight. Mother died age 21 of stroke. Brother has diabetes. 3 children, all heathly Prior cardiac testing: cath 2010, 2012. Echo 02-10-14. MPI 1997, [id number]/03/12 Exercise level: had been daily gym, up to 7 miles/10k steps, prior to Covid. Now walking only sporadically. No limitations.  Current diet: doesn't cook at home. Mostly chicken, fish, vegetables. Enjoys ice cream, pastry rarely.  Hypercholesterolemia: last LDL 124. Was told in the past his total cholesterol was over 400. Was happy that his total cholesterol is less than 200.  Today: Overall, he is feeling okay. He is surprised by today's blood pressure reading (140/70), and notes that his blood pressure was 413+ systolic just last week when he was preparing for a sigmoidoscopy. He has not been monitoring his blood pressure at home.  At times, he is unable to catch a full breath while breathing  normally. He notes this is baseline and this does not seem concerning to him.  For his diet he has been trying to eat one avocado a day.  While on Eliquis he denies having any bleeding issues.  In 11/2020 he retired from work and continues to work on adjusting his routines.  He denies any palpitations, or chest pain. No lightheadedness, headaches, syncope, orthopnea, PND, lower extremity edema or exertional symptoms.   Past Medical History:  Diagnosis Date   Allergic rhinitis, cause unspecified    Allergy    SEASONAL   Anxiety    Blood in stool    BPH (benign prostatic hyperplasia)    CAD (coronary artery disease)    DES mid lad 08/15/2009   Cancer ([place]) 02/11/2008   prostate   Chest pain, unspecified    Colon polyps    CVA (cerebrovascular accident due to intracerebral hemorrhage) [facility]) 2007 Spring   History of prostate cancer 2008/02/11   Dr [name]   HTN (hypertension)    Hypercholesterolemia    Hyperlipidemia    Hypokalemia    Insomnia, persistent    Neoplasm of uncertain behavior of skin    Stroke [facility])    10-Feb-2006   Syncope    Tubular adenoma of colon 02/10/09    Past Surgical History:  Procedure Laterality Date   ANGIOPLASTY     with stents   CARDIOVERSION N/A 04/13/2021   Procedure: CARDIOVERSION;  Surgeon: [name], MD;  Location: [place];  Service: Cardiovascular;  Laterality: N/A;   COLONOSCOPY  02-10-15   Fuller Plan, TA's   POLYPECTOMY  PROSTATECTOMY  2009   TONSILLECTOMY      Current Medications: Current Outpatient Medications on File Prior to Visit  Medication Sig   apixaban (ELIQUIS) 5 MG TABS tablet Take 1 tablet (5 mg total) by mouth 2 (two) times daily.   Cholecalciferol (VITAMIN D3) 2000 UNITS capsule Take 1 capsule (2,000 Units total) by mouth daily. (Patient taking differently: Take 2,000 Units by mouth daily. This changes daily the amount - usually 2000 to 3000 units)   hydrocortisone (ANUSOL-HC) 25 MG suppository Place rectally.    irbesartan-hydrochlorothiazide (AVALIDE) 150-12.5 MG tablet Take 1 tablet by mouth daily.   labetalol (NORMODYNE) 300 MG tablet Take 1 tablet (300 mg total) by mouth 2 (two) times daily.   rosuvastatin (CRESTOR) 20 MG tablet Take 1 tablet (20 mg total) by mouth daily.   triamcinolone cream (KENALOG) 0.5 % Use tid prn (Patient taking differently: Apply 1 application topically 3 (three) times daily as needed (irritation). Use tid prn)   zolpidem (AMBIEN) 10 MG tablet Take 1 tablet (10 mg total) by mouth at bedtime.   loratadine (CLARITIN) 10 MG tablet Take 10 mg by mouth daily. (Patient not taking: Reported on 10/08/2021)   LORazepam (ATIVAN) 0.5 MG tablet Take 1 tablet (0.5 mg total) by mouth 2 (two) times daily as needed for anxiety. (Patient not taking: Reported on 10/08/2021)   No current facility-administered medications on file prior to visit.     Allergies:   Amlodipine, Iodine, and Shellfish allergy   Social History   Tobacco Use   Smoking status: Never   Smokeless tobacco: Never  Vaping Use   Vaping Use: Never used  Substance Use Topics   Alcohol use: Yes    Comment: glass of wine- occ  per pt - occ mixed drink    Drug use: No    Family History: family history includes Diabetes in his brother; Heart disease in his father; Stroke in his mother. There is no history of Colon polyps, Esophageal cancer, Rectal cancer, Stomach cancer, or Colon cancer.  ROS:   Please see the history of present illness.   Additional pertinent ROS otherwise unremarkable.   EKGs/Labs/Other Studies Reviewed:    The following studies were reviewed today:  Echo 04/11/2021: 1. Left ventricular ejection fraction, by estimation, is 45 to 50%. The  left ventricle has mildly decreased function. The left ventricle  demonstrates global hypokinesis. Left ventricular diastolic parameters  were normal.   2. Right ventricular systolic function is normal. The right ventricular  size is mildly enlarged. There  is normal pulmonary artery systolic  pressure.   3. Left atrial size was severely dilated.   4. Right atrial size was moderately dilated.   5. The mitral valve is degenerative. No evidence of mitral valve  regurgitation. No evidence of mitral stenosis.   6. The aortic valve is calcified. There is mild calcification of the  aortic valve. There is mild thickening of the aortic valve. Aortic valve  regurgitation is mild. Mild to moderate aortic valve  sclerosis/calcification is present, without any evidence  of aortic stenosis. Aortic regurgitation PHT measures 351 msec.   7. The inferior vena cava is normal in size with greater than 50%  respiratory variability, suggesting right atrial pressure of 3 mmHg.   US Venous Right LE 06/16/2020: IMPRESSION: No evidence of DVT within the right lower extremity.  Cath 07/31/2011 Left main coronary artery was a very short segment.  There was near side-by-side separate ostia.  No significant disease.  The  proximal LAD had 30% to 40% tubular disease.  The mid LAD had 50% multiple discrete lesions.  This was unchanged from the previous catheterization done in 2010.  There was a widely patent stent in the distal LAD.  The LAD was a large artery that wrapped the apex.   The LAD actually supplied most of the PDA territory, so it was difficult to know what dominance the patient was.  Right coronary artery was small and had no significant disease.   The circumflex coronary artery was somewhat larger than right coronary artery, but again the PDA was mostly supplied by the distal LAD.  There was 20% to 30% ostial lesions proximally.  The ostium of the first obtuse marginal branch had a 40% lesion.   The patient also had a very large diagonal branch, which did not have significant disease and provided most of the anterolateral wall.   Left ventriculography.  Left ventriculography was normal.  EF was 60%. There was no gradient across the aortic valve and no MR.   Aortic pressure was 132/71.  LV pressure was somewhat inaccurate with the LV tracing at 160/11.   IMPRESSION:  The patient has continued moderate disease in the mid LAD after the diagonal takeoff.  I closely reviewed his previous film from 2010 and there has been no change in this disease.  His stent is widely patent and he does not have any new disease in the right coronary artery or circ.  Clinically when I talked to the patient it sounded like he had a seizure.  There is no antecedent chest pain, palpitations, or cardiac symptoms.   The patient was fairly insistent on having a diagnostic cath rather than a stress test.   I have encouraged the patient to follow up with Neurology.  We have already arranged for him to have an EEG, MRI with MRA, and neurological followup.   Given the patency of the stent, normal LV function would be highly unlikely that his "seizure event was cardiac in etiology."  At the end of the case, a TR band was placed with good hemostasis.  He tolerated the procedure well.  Cath done by Dr. Olevia Perches. During the procedure, he had a contrast reaction to the dye (reported prior iodine/shellfish allergy), was treated for this. Received PCI to LAD with stenosis form 95% to 0%.   EKG:  EKG is personally reviewed.   10/08/2021: EKG was not ordered today. 05/11/2021: sinus bradycardia, 54 bpm 09/22/20: sinus bradycardia, LAFB at 58 bpm  Recent Labs: 03/15/2021: Magnesium 1.9 08/20/2021: ALT 23; BUN 14; Creatinine, Ser 1.15; Hemoglobin 15.0; Platelets 110.0; Potassium 3.7; Sodium 141; TSH 2.38   Recent Lipid Panel    Component Value Date/Time   CHOL 198 08/20/2021 1012   TRIG 74.0 08/20/2021 1012   HDL 49.90 08/20/2021 1012   CHOLHDL 4 08/20/2021 1012   VLDL 14.8 08/20/2021 1012   LDLCALC 134 (H) 08/20/2021 1012   LDLDIRECT 296.1 08/12/2013 0936    Physical Exam:    VS:  BP 140/70   Pulse (!) 58   Ht _0  (1.727 m)   Wt 174 lb 11.2 oz (79.2 kg)   SpO2 95%    BMI 26.56 kg/m   Recheck 170/78  Wt Readings from Last 3 Encounters:  10/08/21 174 lb 11.2 oz (79.2 kg)  05/11/21 173 lb (78.5 kg)  04/16/21 171 lb (77.6 kg)    GEN: Well nourished, well developed in no acute distress HEENT: Normal, moist mucous membranes  NECK: No JVD CARDIAC: regular rhythm, normal S1 and S2, no rubs or gallops. No murmur. VASCULAR: Radial and DP pulses 2+ bilaterally. No carotid bruits RESPIRATORY:  Clear to auscultation without rales, wheezing or rhonchi  ABDOMEN: Soft, non-tender, non-distended MUSCULOSKELETAL:  Ambulates independently SKIN: Warm and dry, no edema NEUROLOGIC:  Alert and oriented x 3. No focal neuro deficits noted. PSYCHIATRIC:  Normal affect     ASSESSMENT:    1. Atrial flutter, unspecified type ([place])   2. Coronary artery disease involving native coronary artery of native heart without angina pectoris   3. Essential hypertension   4. Pure hypercholesterolemia   5. Cardiac risk counseling   6. History of coronary angioplasty with insertion of stent     PLAN:    Atrial flutter: s/p cardioversion and maintaining sinus rhythm -CHA2DS2/VAS Stroke Risk Points= 4  -no bleeding issues, continue apixaban -continue labetalol  CAD with history of prior stent: asymptomatic.  -stopped aspirin with start of apixaban -continue rosuvastatin 20 mg daily -LDL goal <70, see below re: patient preference -reviewed red flag warning signs that need immediate medical attention  Hypertension: Overall goal <130/80 -only mildly elevated today, can get to ~503 systolic with stressful situations (has occurred in doctor's office before procedure, etc) -does not check BP at home.  -continue irbesartan-HCTZ 150-12.5 mg daily. We discussed that taking this at night may exacerbate nocturia, but he feels this is manageable and current regimen is working well -continue labetalol, has been on long term. -has been on amlodipine in the past if additional agent needed  in the future. Alternative would be to increase irbesartan-HCTZ dose to maximum dosing. He is feeling well and happy with today's number, would like to defer on making changes at this time.  Hypercholesterolemia: LDL goal <70 -reviewed lipids from 08/20/21 per [facility]. LDL 134, Tchol 198. Discussed guidelines/recommendations for LDL <70. Discussed options today. After shared decision making, will not uptitrate or adjust regimen today per patient preference -continue rosuvastatin 20 mg daily. Given current numbers, do not expect he would get to goal with increase in rosuvastatin to 40 mg daily or addition of ezetimibe. He would likely need at [facility] agent. Continue to address  Cardiac risk counseling and prevention recommendations: -recommend heart healthy/Mediterranean diet, with whole grains, fruits, vegetable, fish, lean meats, nuts, and olive oil. Limit salt. -recommend moderate walking, 3-5 times/week for 30-50 minutes each session. Aim for at least 150 minutes.week. Goal should be pace of 3 miles/hours, or walking 1.5 miles in 30 minutes -recommend avoidance of tobacco products. Avoid excess alcohol.  Plan for follow up: 6 months per patient preference or sooner as needed.  [name], MD, PhD, [facility]   Medication Adjustments/Labs and Tests Ordered: Current medicines are reviewed at length with the patient today.  Concerns regarding medicines are outlined above.   No orders of the defined types were placed in this encounter.  No orders of the defined types were placed in this encounter.   Patient Instructions  Medication Instructions:  Your physician recommends that you continue on your current medications as directed. Please refer to the Current Medication list given to you today.   *If you need a refill on your cardiac medications before your next appointment, please call your pharmacy*  Lab  Work: NONE  Testing/Procedures: NONE  Follow-Up: At [facility], you and your health needs are our priority.  As part of our continuing mission to provide you with exceptional heart care, we have  created designated Provider Care Teams.  These Care Teams include your primary Cardiologist (physician) and Advanced Practice Providers (APPs -  Physician Assistants and Nurse Practitioners) who all work together to provide you with the care you need, when you need it.  We recommend signing up for the patient portal called "MyChart".  Sign up information is provided on this After Visit Summary.  MyChart is used to connect with patients for Virtual Visits (Telemedicine).  Patients are able to view lab/test results, encounter notes, upcoming appointments, etc.  Non-urgent messages can be sent to your provider as well.   To learn more about what you can do with MyChart, go to [email or web address].    Your next appointment:   6 month(s)  The format for your next appointment:   In Person  Provider:   [name], MD{    I,Mathew Stumpf,acting as a scribe for [name], MD.,have documented all relevant documentation on the behalf of [name], MD,as directed by  [name], MD while in the presence of [name], MD.  I, [name], MD, have reviewed all documentation for this visit. The documentation on 10/08/21 for the exam, diagnosis, procedures, and orders are all accurate and complete.   Signed, [name], MD PhD 10/08/2021    [facility]

## 2021-10-08 NOTE — Patient Instructions (Signed)
Medication Instructions:  Your physician recommends that you continue on your current medications as directed. Please refer to the Current Medication list given to you today.  *If you need a refill on your cardiac medications before your next appointment, please call your pharmacy*  Lab Work: NONE  Testing/Procedures: NONE  Follow-Up: At [facility], you and your health needs are our priority.  As part of our continuing mission to provide you with exceptional heart care, we have created designated Provider Care Teams.  These Care Teams include your primary Cardiologist (physician) and Advanced Practice Providers (APPs -  Physician Assistants and Nurse Practitioners) who all work together to provide you with the care you need, when you need it.  We recommend signing up for the patient portal called "MyChart".  Sign up information is provided on this After Visit Summary.  MyChart is used to connect with patients for Virtual Visits (Telemedicine).  Patients are able to view lab/test results, encounter notes, upcoming appointments, etc.  Non-urgent messages can be sent to your provider as well.   To learn more about what you can do with MyChart, go to [email or web address].    Your next appointment:   6 month(s)  The format for your next appointment:   In Person  Provider:   Bridgette Christopher, MD     

## 2021-11-08 ENCOUNTER — Ambulatory Visit: Payer: Medicare PPO

## 2021-11-14 ENCOUNTER — Telehealth: Payer: Self-pay | Admitting: Internal Medicine

## 2021-11-14 NOTE — Telephone Encounter (Signed)
Patient states provider directed him to take 1 tablet twice a day of labetalol (NORMODYNE) 300 MG tablet  Patient states directions on rx bottle reads 1/2 a tablet twice a day  Patient is requesting a call back to discuss discrepancy or a new prescription called into pharmacy   Discrepancy located on patient's med list and avs of 08-21-2021 ov  [place], [address]

## 2021-11-15 ENCOUNTER — Other Ambulatory Visit: Payer: Self-pay

## 2021-11-15 ENCOUNTER — Ambulatory Visit: Payer: Medicare PPO

## 2021-11-16 MED ORDER — LABETALOL HCL 300 MG PO TABS: 300.0000 mg | ORAL_TABLET | Freq: Two times a day (BID) | ORAL | 3 refills | Status: DC

## 2021-11-16 NOTE — Telephone Encounter (Signed)
Called pt he states he been taking 1 whole pill twice a day. When he saw MD at last visit was having issues w/and MD told him to take a whole pill twice a day. Pharmacy need new rx for 1 twice a day. The script that they have says 1/2 bid. Inform pt will send to pof..lmb

## 2021-11-16 NOTE — Telephone Encounter (Signed)
Patient calling in  Following up from previous message left yesterday.. please call patient [phone]

## 2021-11-16 NOTE — Telephone Encounter (Signed)
How is he taking it?  Thanks

## 2021-11-18 NOTE — Telephone Encounter (Signed)
Okay.  In fact it was renewed for 1 twice a day on November 16, 2021.  Thanks

## 2021-11-21 ENCOUNTER — Ambulatory Visit (INDEPENDENT_AMBULATORY_CARE_PROVIDER_SITE_OTHER): Payer: Medicare PPO

## 2021-11-21 ENCOUNTER — Ambulatory Visit: Payer: Medicare PPO

## 2021-11-21 ENCOUNTER — Other Ambulatory Visit: Payer: Self-pay

## 2021-11-21 DIAGNOSIS — Z0000 Encounter for general adult medical examination without abnormal findings: Secondary | ICD-10-CM

## 2021-11-21 NOTE — Progress Notes (Addendum)
I connected with Andrew Joseph today by telephone and verified that I am speaking with the correct person using two identifiers. Location patient: home Location provider: work Persons participating in the virtual visit: patient, provider.   I discussed the limitations, risks, security and privacy concerns of performing an evaluation and management service by telephone and the availability of in person appointments. I also discussed with the patient that there may be a patient responsible charge related to this service. The patient expressed understanding and verbally consented to this telephonic visit.    Interactive audio and video telecommunications were attempted between this provider and patient, however failed, due to patient having technical difficulties OR patient did not have access to video capability.  We continued and completed visit with audio only.  Some vital signs may be absent or patient reported.   Time Spent with patient on telephone encounter: 40 minutes  Subjective:   Andrew Joseph. is a 75 y.o. male who presents for Medicare Annual/Subsequent preventive examination.  Review of Systems     Cardiac Risk Factors include: advanced age (>62men, >42 women);dyslipidemia;family history of premature cardiovascular disease;hypertension;male gender     Objective:    There were no vitals filed for this visit. There is no height or weight on file to calculate BMI.  Advanced Directives 11/21/2021 04/13/2021 09/22/2020 12/08/2014 08/17/2014  Does Patient Have a Medical Advance Directive? No No No No No  Would patient like information on creating a medical advance directive? No - Patient declined No - Patient declined No - Patient declined - No - patient declined information    Current Medications (verified) Outpatient Encounter Medications as of 11/21/2021  Medication Sig   apixaban (ELIQUIS) 5 MG TABS tablet Take 1 tablet (5 mg total) by mouth 2 (two) times daily.    Cholecalciferol (VITAMIN D3) 2000 UNITS capsule Take 1 capsule (2,000 Units total) by mouth daily. (Patient taking differently: Take 2,000 Units by mouth daily. This changes daily the amount - usually 2000 to 3000 units)   hydrocortisone (ANUSOL-HC) 25 MG suppository Place rectally.   irbesartan-hydrochlorothiazide (AVALIDE) 150-12.5 MG tablet Take 1 tablet by mouth daily.   labetalol (NORMODYNE) 300 MG tablet Take 1 tablet (300 mg total) by mouth 2 (two) times daily.   rosuvastatin (CRESTOR) 20 MG tablet Take 1 tablet (20 mg total) by mouth daily.   triamcinolone cream (KENALOG) 0.5 % Use tid prn (Patient taking differently: Apply 1 application topically 3 (three) times daily as needed (irritation). Use tid prn)   zolpidem (AMBIEN) 10 MG tablet Take 1 tablet (10 mg total) by mouth at bedtime.   No facility-administered encounter medications on file as of 11/21/2021.    Allergies (verified) Amlodipine, Iodine, and Shellfish allergy   History: Past Medical History:  Diagnosis Date   Allergic rhinitis, cause unspecified    Allergy    SEASONAL   Anxiety    Blood in stool    BPH (benign prostatic hyperplasia)    CAD (coronary artery disease)    DES mid lad 08/15/2009   Cancer ([place]) 2009   prostate   Chest pain, unspecified    Colon polyps    CVA (cerebrovascular accident due to intracerebral hemorrhage) [facility]) 2007 Spring   History of prostate cancer 2009   Dr [name]   HTN (hypertension)    Hypercholesterolemia    Hyperlipidemia    Hypokalemia    Insomnia, persistent    Neoplasm of uncertain behavior of skin    Stroke ([place])  2007   Syncope    Tubular adenoma of colon 2010   Past Surgical History:  Procedure Laterality Date   ANGIOPLASTY     with stents   CARDIOVERSION N/A 04/13/2021   Procedure: CARDIOVERSION;  Surgeon: [name], MD;  Location: [facility] ENDOSCOPY;  Service: Cardiovascular;  Laterality: N/A;   COLONOSCOPY  2016   Stark, TA's   POLYPECTOMY      PROSTATECTOMY  2009   TONSILLECTOMY     Family History  Problem Relation Age of Onset   Stroke Mother    Heart disease Father        MI   Diabetes Brother        Obese   Colon polyps Neg Hx    Esophageal cancer Neg Hx    Rectal cancer Neg Hx    Stomach cancer Neg Hx    Colon cancer Neg Hx    Social History   Socioeconomic History   Marital status: Divorced    Spouse name: Not on file   Number of children: 3   Years of education: Not on file   Highest education level: Professional school degree (e.g., MD, DDS, DVM, JD)  Occupational History   Occupation: DIST COURT JUDGE    Employer: [employer]    Comment: Semi-retired   Tobacco Use   Smoking status: Never   Smokeless tobacco: Never  Vaping Use   Vaping Use: Never used  Substance and Sexual Activity   Alcohol use: Yes    Comment: glass of wine- occ  per pt - occ mixed drink    Drug use: No   Sexual activity: Not Currently  Other Topics Concern   Not on file  Social History Narrative   Retired [employer].   Divorced   Social Determinants of Radio broadcast assistant Strain: Low Risk    Difficulty of Paying Living Expenses: Not hard at all  Food Insecurity: No Food Insecurity   Worried About Charity fundraiser in the Last Year: Never true   Arboriculturist in the Last Year: Never true  Transportation Needs: No Transportation Needs   Lack of Transportation (Medical): No   Lack of Transportation (Non-Medical): No  Physical Activity: Sufficiently Active   Days of Exercise per Week: 5 days   Minutes of Exercise per Session: 30 min  Stress: No Stress Concern Present   Feeling of Stress : Not at all  Social Connections: Moderately Integrated   Frequency of Communication with Friends and Family: More than three times a week   Frequency of Social Gatherings with Friends and Family: More than three times a week   Attends Religious Services: More than 4 times per year   Active Member of [employer] or  Organizations: Yes   Attends Music therapist: More than 4 times per year   Marital Status: Divorced    Tobacco Counseling Counseling given: Not Answered   Clinical Intake:  Pre-visit preparation completed: Yes  Pain : No/denies pain     Nutritional Risks: None Diabetes: No  How often do you need to have someone help you when you read instructions, pamphlets, or other written materials from your doctor or pharmacy?: 1 - Never 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 no  Interpreter Needed?: No  Information entered by :: Lisette Abu, LPN   Activities of Daily Living In your present state of health, do you have any difficulty  performing the following activities: 11/21/2021  Hearing? N  Vision? N  Difficulty concentrating or making decisions? N  Walking or climbing stairs? N  Dressing or bathing? N  Doing errands, shopping? N  Preparing Food and eating ? N  Using the Toilet? N  In the past six months, have you accidently leaked urine? N  Do you have problems with loss of bowel control? N  Managing your Medications? N  Managing your Finances? N  Housekeeping or managing your Housekeeping? N  Some recent data might be hidden    Patient Care Team: [name], [name], MD as PCP - General [name], MD as PCP - Cardiology (Cardiology) Josue Hector, MD as Consulting Physician (Cardiology)  Indicate any recent Medical Services you may have received from other than [facility] providers in the past year (date may be approximate).     Assessment:   This is a routine wellness examination for [facility].  Hearing/Vision screen Hearing Screening - Comments:: Patient denied any hearing difficulty.   No hearing aids.  Vision Screening - Comments:: Patient wears corrective glasses/contacts.  Eye exam not done annually.  Patient stated that there were no issues with his vision.  Dietary issues and  exercise activities discussed: Current Exercise Habits: Home exercise routine, Type of exercise: walking, Time (Minutes): 30, Frequency (Times/Week): 5, Weekly Exercise (Minutes/Week): 150, Intensity: Moderate, Exercise limited by: None identified   Goals Addressed               This Visit's Progress     Patient Stated (pt-stated)        My goal for 2023 is to stay alive and very independent.      Depression Screen PHQ 2/9 Scores 11/21/2021 09/22/2020 10/21/2019 08/31/2018 01/20/2017 09/01/2015  PHQ - 2 Score 0 0 0 0 0 0    Fall Risk Fall Risk  11/21/2021 09/22/2020 10/21/2019 08/31/2018 01/20/2017  Falls in the past year? 0 0 0 No No  Number falls in past yr: 0 0 0 - -  Injury with Fall? 0 0 0 - -  Risk for fall due to : No Fall Risks No Fall Risks - - -  Follow up Falls prevention discussed Falls evaluation completed Falls evaluation completed - -    FALL RISK PREVENTION PERTAINING TO THE HOME:  Any stairs in or around the home? Yes  If so, are there any without handrails? No  Home free of loose throw rugs in walkways, pet beds, electrical cords, etc? Yes  Adequate lighting in your home to reduce risk of falls? Yes   ASSISTIVE DEVICES UTILIZED TO PREVENT FALLS:  Life alert? No  Use of a cane, walker or w/c? No  Grab bars in the bathroom? No  Shower chair or bench in shower? No  Elevated toilet seat or a handicapped toilet? No   TIMED UP AND GO:  Was the test performed? No .  Length of time to ambulate 10 feet: n/a sec.   Gait steady and fast without use of assistive device  Cognitive Function: Normal cognitive status assessed by direct observation by this Nurse Health Advisor. No abnormalities found.         Immunizations Immunization History  Administered Date(s) Administered   Tdap 03/21/2013, 04/19/2019    TDAP status: Up to date  Flu Vaccine status: Declined, Education has been provided regarding the importance of this vaccine but patient still  declined. Advised may receive this vaccine at local pharmacy or Health Dept. Aware to provide  a copy of the vaccination record if obtained from local pharmacy or Health Dept. Verbalized acceptance and understanding.  Pneumococcal vaccine status: Declined,  Education has been provided regarding the importance of this vaccine but patient still declined. Advised may receive this vaccine at local pharmacy or Health Dept. Aware to provide a copy of the vaccination record if obtained from local pharmacy or Health Dept. Verbalized acceptance and understanding.   Covid-19 vaccine status: Declined, Education has been provided regarding the importance of this vaccine but patient still declined. Advised may receive this vaccine at local pharmacy or Health Dept.or vaccine clinic. Aware to provide a copy of the vaccination record if obtained from local pharmacy or Health Dept. Verbalized acceptance and understanding.  Qualifies for Shingles Vaccine? Yes   Zostavax completed No   Shingrix Completed?: No.    Education has been provided regarding the importance of this vaccine. Patient has been advised to call insurance company to determine out of pocket expense if they have not yet received this vaccine. Advised may also receive vaccine at local pharmacy or Health Dept. Verbalized acceptance and understanding.  Screening Tests Health Maintenance  Topic Date Due   Pneumonia Vaccine 73+ Years old (1 - PCV) Never done   Hepatitis C Screening  Never done   Zoster Vaccines- Shingrix (1 of 2) Never done   COLONOSCOPY (Pts 45-79yrs Insurance coverage will need to be confirmed)  12/21/2019   COVID-19 Vaccine (1) 05/03/2023 (Originally 10/21/1946)   TETANUS/TDAP  04/18/2029   HPV VACCINES  Aged Out   INFLUENZA VACCINE  Discontinued    Health Maintenance  Health Maintenance Due  Topic Date Due   Pneumonia Vaccine 22+ Years old (1 - PCV) Never done   Hepatitis C Screening  Never done   Zoster Vaccines- Shingrix  (1 of 2) Never done   COLONOSCOPY (Pts 45-46yrs Insurance coverage will need to be confirmed)  12/21/2019    Colorectal cancer screening: Type of screening: Sigmoidoscopy. Completed 10/05/2021. Repeat every 0 years  Lung Cancer Screening: (Low Dose CT Chest recommended if Age 40-80 years, 30 pack-year currently smoking OR have quit w/in 15years.) does not qualify.   Lung Cancer Screening Referral: no  Additional Screening:  Hepatitis C Screening: does qualify; Completed no  Vision Screening: Recommended annual ophthalmology exams for early detection of glaucoma and other disorders of the eye. Is the patient up to date with their annual eye exam?  No  Who is the provider or what is the name of the office in which the patient attends annual eye exams? Patient stated no issues with vision If pt is not established with a provider, would they like to be referred to a provider to establish care? No .   Dental Screening: Recommended annual dental exams for proper oral hygiene  Community Resource Referral / Chronic Care Management: CRR required this visit?  No   CCM required this visit?  No      Plan:     I have personally reviewed and noted the following in the patients chart:   Medical and social history Use of alcohol, tobacco or illicit drugs  Current medications and supplements including opioid prescriptions. Patient is not currently taking opioid prescriptions. Functional ability and status Nutritional status Physical activity Advanced directives List of other physicians Hospitalizations, surgeries, and ER visits in previous 12 months Vitals Screenings to include cognitive, depression, and falls Referrals and appointments  In addition, I have reviewed and discussed with patient certain preventive protocols, quality metrics, and  best practice recommendations. A written personalized care plan for preventive services as well as general preventive health recommendations were  provided to patient.     Sheral Flow, LPN   15/83/0940   Nurse Notes:  Patient is cogitatively intact. There were no vitals filed for this visit. There is no height or weight on file to calculate BMI.  Medical screening examination/treatment/procedure(s) were performed by non-physician practitioner and as supervising physician I was immediately available for consultation/collaboration.  I agree with above. Lew Dawes, MD

## 2021-12-04 DIAGNOSIS — K134 Granuloma and granuloma-like lesions of oral mucosa: Secondary | ICD-10-CM | POA: Diagnosis not present

## 2021-12-20 ENCOUNTER — Telehealth: Payer: Self-pay | Admitting: Internal Medicine

## 2021-12-20 DIAGNOSIS — E785 Hyperlipidemia, unspecified: Secondary | ICD-10-CM

## 2021-12-20 DIAGNOSIS — I4892 Unspecified atrial flutter: Secondary | ICD-10-CM

## 2021-12-20 NOTE — Telephone Encounter (Signed)
Patient requesting an order for labs prior to his 12-25-2021 f/u

## 2021-12-21 ENCOUNTER — Other Ambulatory Visit: Payer: Medicare PPO

## 2021-12-22 NOTE — Telephone Encounter (Signed)
Okay.  Thanks.

## 2021-12-25 ENCOUNTER — Other Ambulatory Visit (INDEPENDENT_AMBULATORY_CARE_PROVIDER_SITE_OTHER): Payer: Medicare PPO

## 2021-12-25 ENCOUNTER — Ambulatory Visit: Payer: Medicare PPO | Admitting: Internal Medicine

## 2021-12-25 ENCOUNTER — Encounter: Payer: Self-pay | Admitting: Internal Medicine

## 2021-12-25 ENCOUNTER — Other Ambulatory Visit: Payer: Self-pay

## 2021-12-25 ENCOUNTER — Other Ambulatory Visit: Payer: Self-pay | Admitting: Internal Medicine

## 2021-12-25 VITALS — BP 142/62 | HR 58 | Temp 97.8°F | Ht 68.0 in | Wt 174.8 lb

## 2021-12-25 DIAGNOSIS — E876 Hypokalemia: Secondary | ICD-10-CM

## 2021-12-25 DIAGNOSIS — Z8546 Personal history of malignant neoplasm of prostate: Secondary | ICD-10-CM | POA: Diagnosis not present

## 2021-12-25 DIAGNOSIS — E785 Hyperlipidemia, unspecified: Secondary | ICD-10-CM | POA: Diagnosis not present

## 2021-12-25 DIAGNOSIS — K635 Polyp of colon: Secondary | ICD-10-CM | POA: Insufficient documentation

## 2021-12-25 DIAGNOSIS — I10 Essential (primary) hypertension: Secondary | ICD-10-CM

## 2021-12-25 DIAGNOSIS — I4892 Unspecified atrial flutter: Secondary | ICD-10-CM | POA: Diagnosis not present

## 2021-12-25 LAB — LIPID PANEL
Cholesterol: 193 mg/dL (ref 0–200)
HDL: 46.2 mg/dL (ref >39.00)
LDL Cholesterol: 127 mg/dL — ABNORMAL HIGH (ref 0–99)
NonHDL: 146.47
Total CHOL/HDL Ratio: 4
Triglycerides: 98 mg/dL (ref 0.0–149.0)
VLDL: 19.6 mg/dL (ref 0.0–40.0)

## 2021-12-25 LAB — COMPREHENSIVE METABOLIC PANEL
ALT: 23 U/L (ref 0–53)
AST: 21 U/L (ref 0–37)
Albumin: 4.3 g/dL (ref 3.5–5.2)
Alkaline Phosphatase: 60 U/L (ref 39–117)
BUN: 15 mg/dL (ref 6–23)
CO2: 30 mEq/L (ref 19–32)
Calcium: 9.6 mg/dL (ref 8.4–10.5)
Chloride: 102 mEq/L (ref 96–112)
Creatinine, Ser: 1.05 mg/dL (ref 0.40–1.50)
GFR: 68.87 mL/min (ref >60.00)
Glucose, Bld: 102 mg/dL — ABNORMAL HIGH (ref 70–99)
Potassium: 3.3 mEq/L — ABNORMAL LOW (ref 3.5–5.1)
Sodium: 139 mEq/L (ref 135–145)
Total Bilirubin: 0.8 mg/dL (ref 0.2–1.2)
Total Protein: 6.8 g/dL (ref 6.0–8.3)

## 2021-12-25 MED ORDER — POTASSIUM CHLORIDE CRYS ER 10 MEQ PO TBCR
10.0000 meq | EXTENDED_RELEASE_TABLET | Freq: Two times a day (BID) | ORAL | 3 refills | Status: DC
Start: 2021-12-25 — End: 2023-03-10

## 2021-12-25 NOTE — Assessment & Plan Note (Signed)
Low K Re-start Kdur 10 qd RTC 3 months

## 2021-12-25 NOTE — Assessment & Plan Note (Signed)
On Eliquis

## 2021-12-25 NOTE — Progress Notes (Signed)
Subjective:  Patient ID: Andrew Joseph., male    DOB: [birth year]  Age: 76 y.o. MRN: [id number]  CC: Follow-up (4 month f/u)   HPI [name]. presents for CAD, A flutter, colon polyps - Dr Glennon Hamilton just did a sigmoidoscopy. F/u on HTN - low BP at his gym 113/50.   Outpatient Medications Prior to Visit  Medication Sig Dispense Refill   apixaban (ELIQUIS) 5 MG TABS tablet Take 1 tablet (5 mg total) by mouth 2 (two) times daily. 180 tablet 3   Cholecalciferol (VITAMIN D3) 2000 UNITS capsule Take 1 capsule (2,000 Units total) by mouth daily. (Patient taking differently: Take 2,000 Units by mouth daily. This changes daily the amount - usually 2000 to 3000 units) 100 capsule 3   hydrocortisone (ANUSOL-HC) 25 MG suppository Place rectally.     irbesartan-hydrochlorothiazide (AVALIDE) 150-12.5 MG tablet Take 1 tablet by mouth daily. 90 tablet 3   labetalol (NORMODYNE) 300 MG tablet Take 1 tablet (300 mg total) by mouth 2 (two) times daily. 180 tablet 3   rosuvastatin (CRESTOR) 20 MG tablet Take 1 tablet (20 mg total) by mouth daily. 90 tablet 3   triamcinolone cream (KENALOG) 0.5 % Use tid prn (Patient taking differently: Apply 1 application topically 3 (three) times daily as needed (irritation). Use tid prn) 60 g 1   zolpidem (AMBIEN) 10 MG tablet Take 1 tablet (10 mg total) by mouth at bedtime. 90 tablet 1   No facility-administered medications prior to visit.    ROS: Review of Systems  Constitutional:  Negative for appetite change, fatigue and unexpected weight change.  HENT:  Negative for congestion, nosebleeds, sneezing, sore throat and trouble swallowing.   Eyes:  Negative for itching and visual disturbance.  Respiratory:  Negative for cough.   Cardiovascular:  Negative for chest pain, palpitations and leg swelling.  Gastrointestinal:  Negative for abdominal distention, blood in stool, diarrhea and nausea.  Genitourinary:  Negative for frequency and hematuria.   Musculoskeletal:  Negative for back pain, gait problem, joint swelling and neck pain.  Skin:  Negative for rash.  Neurological:  Negative for dizziness, tremors, speech difficulty and weakness.  Psychiatric/Behavioral:  Negative for agitation, dysphoric mood and sleep disturbance. The patient is not nervous/anxious.    Objective:  BP (!) 142/62 (BP Location: Left Arm)    Pulse (!) 58    Temp 97.8 F (36.6 C) (Oral)    Ht 5\' 8"  (1.727 m)    Wt 174 lb 12.8 oz (79.3 kg)    SpO2 96%    BMI 26.58 kg/m   BP Readings from Last 3 Encounters:  12/25/21 (!) 142/62  10/08/21 140/70  08/21/21 (!) 160/90    Wt Readings from Last 3 Encounters:  12/25/21 174 lb 12.8 oz (79.3 kg)  10/08/21 174 lb 11.2 oz (79.2 kg)  05/11/21 173 lb (78.5 kg)    Physical Exam Constitutional:      General: He is not in acute distress.    Appearance: He is well-developed.     Comments: NAD  Eyes:     Conjunctiva/sclera: Conjunctivae normal.     Pupils: Pupils are equal, round, and reactive to light.  Neck:     Thyroid: No thyromegaly.     Vascular: No JVD.  Cardiovascular:     Rate and Rhythm: Normal rate and regular rhythm.     Heart sounds: Normal heart sounds. No murmur heard.   No friction rub. No gallop.  Pulmonary:  Effort: Pulmonary effort is normal. No respiratory distress.     Breath sounds: Normal breath sounds. No wheezing or rales.  Chest:     Chest wall: No tenderness.  Abdominal:     General: Bowel sounds are normal. There is no distension.     Palpations: Abdomen is soft. There is no mass.     Tenderness: There is no abdominal tenderness. There is no guarding or rebound.  Musculoskeletal:        General: No tenderness. Normal range of motion.     Cervical back: Normal range of motion.  Lymphadenopathy:     Cervical: No cervical adenopathy.  Skin:    General: Skin is warm and dry.     Findings: No rash.  Neurological:     Mental Status: He is alert and oriented to person, place,  and time.     Cranial Nerves: No cranial nerve deficit.     Motor: No abnormal muscle tone.     Coordination: Coordination normal.     Gait: Gait normal.     Deep Tendon Reflexes: Reflexes are normal and symmetric.  Psychiatric:        Behavior: Behavior normal.        Thought Content: Thought content normal.        Judgment: Judgment normal.    Lab Results  Component Value Date   WBC 4.4 08/20/2021   HGB 15.0 08/20/2021   HCT 43.3 08/20/2021   PLT 110.0 (L) 08/20/2021   GLUCOSE 102 (H) 12/25/2021   CHOL 193 12/25/2021   TRIG 98.0 12/25/2021   HDL 46.20 12/25/2021   LDLDIRECT 296.1 08/12/2013   LDLCALC 127 (H) 12/25/2021   ALT 23 12/25/2021   AST 21 12/25/2021   NA 139 12/25/2021   K 3.3 (L) 12/25/2021   CL 102 12/25/2021   CREATININE 1.05 12/25/2021   BUN 15 12/25/2021   CO2 30 12/25/2021   TSH 2.38 08/20/2021   PSA 0.00 (L) 08/20/2021   INR 0.99 07/31/2011   HGBA1C 5.8 03/09/2014    No results found.  Assessment & Plan:   Problem List Items Addressed This Visit     Atrial flutter ([place])    On Eliquis      Relevant Orders   Comprehensive metabolic panel   TSH   Lipid panel   Colon polyps    Dr Glennon Hamilton ([facility]) just did a sigmoidoscopy      Dyslipidemia    Cont on Vytorin      Essential hypertension    Cont on Avalide and labetalol - OK to take Labetalol 300 mg in am and 150 mg at night) F/u on HTN - low BP at his gym 113/50.       Relevant Orders   Comprehensive metabolic panel   TSH   Lipid panel   Hypokalemia - Primary    Low K Re-start Kdur 10 qd RTC 3 months      PROSTATE CANCER, HX OF   Relevant Orders   PSA      Meds ordered this encounter  Medications   potassium chloride (KLOR-CON M) 10 MEQ tablet    Sig: Take 1 tablet (10 mEq total) by mouth 2 (two) times daily.    Dispense:  90 tablet    Refill:  3      Follow-up: Return in about 3 months (around 03/25/2022) for a follow-up visit.  [name], MD

## 2021-12-25 NOTE — Patient Instructions (Signed)
Indication for procedure: Lump noted on digital rectal exam Findings of procedure: Internal hemorrhoids 2 rectal ulcers diminutive polyp biopsies could not be performed because the patient is on Eliquis Recommendations: Local therapy started. Follow-up visit in 2 months. Full: When the patient is able to come off of anticoagulation. Rosana Berger, MD 10/05/2021 / 9:25 AM

## 2021-12-25 NOTE — Assessment & Plan Note (Addendum)
Cont on Avalide and labetalol - OK to take Labetalol 300 mg in am and 150 mg at night) F/u on HTN - low BP at his gym 113/50.

## 2021-12-25 NOTE — Assessment & Plan Note (Signed)
Dr Glennon Hamilton ([facility]) just did a sigmoidoscopy

## 2021-12-25 NOTE — Assessment & Plan Note (Signed)
Cont on Vytorin 

## 2022-01-03 DIAGNOSIS — B342 Coronavirus infection, unspecified: Secondary | ICD-10-CM | POA: Diagnosis not present

## 2022-01-22 DIAGNOSIS — K620 Anal polyp: Secondary | ICD-10-CM | POA: Diagnosis not present

## 2022-01-22 DIAGNOSIS — Z86010 Personal history of colonic polyps: Secondary | ICD-10-CM | POA: Diagnosis not present

## 2022-02-20 ENCOUNTER — Other Ambulatory Visit (HOSPITAL_BASED_OUTPATIENT_CLINIC_OR_DEPARTMENT_OTHER): Payer: Self-pay | Admitting: Cardiology

## 2022-03-08 ENCOUNTER — Ambulatory Visit (INDEPENDENT_AMBULATORY_CARE_PROVIDER_SITE_OTHER): Payer: Medicare PPO

## 2022-03-08 DIAGNOSIS — I484 Atypical atrial flutter: Secondary | ICD-10-CM

## 2022-03-08 DIAGNOSIS — I4892 Unspecified atrial flutter: Secondary | ICD-10-CM

## 2022-03-08 NOTE — Progress Notes (Signed)
? ?  Nurse Visit  ? ?Date of Encounter: 03/08/2022 ?ID: Andrew Joseph., DOB 1946-02-28, MRN [id number] ? ?PCP:  [name], [name], MD ?  ?[facility] Providers ?Cardiologist:  [name], MD    ? ? ?Visit Details  ? ?VS:  BP 132/76   Ht '5\' 8"'$  (1.727 m)   Wt 174 lb (78.9 kg)   BMI 26.46 kg/m?  , BMI Body mass index is 26.46 kg/m?. ? ?Wt Readings from Last 3 Encounters:  ?03/08/22 174 lb (78.9 kg)  ?12/25/21 174 lb 12.8 oz (79.3 kg)  ?10/08/21 174 lb 11.2 oz (79.2 kg)  ?  ? ?Reason for visit: irregular heartbeat ?Performed today:  BP, Vitals, EKG, Provider consulted:Dr Harrell Gave, and Education ?Changes (medications, testing, etc.) : No ?Length of Visit: 40 minutes ? ? ? ?Medications Adjustments/Labs and Tests Ordered: ?No orders of the defined types were placed in this encounter. ? ?No orders of the defined types were placed in this encounter. ? ? ? ?Signed, ?Idamae Lusher, RN  ?03/08/2022 2:25 PM  ?

## 2022-03-12 NOTE — Progress Notes (Signed)
? ? ?Office Visit  ?  ?Patient Name: Andrew Joseph. ?Date of Encounter: 03/14/2022 ? ?Primary Care Provider:  [name], MD ?Primary Cardiologist:  [name], MD ?Primary Electrophysiologist: None ?Chief Complaint  ?  ?Follow-up for irregular heartbeat ? ? Patient Profile: ?CAD s/p distal LAD stent 2012 ?Paroxysmal atrial flutter s/p cardioversion ?CVA 2007 ?HTN ?HLD ?CKD ? ? Recent Studies: ?04/2021 TTE: LVEF 45-50%, mildly decreased LV function, global hypokinesis, dilated LA, moderately dilated RA, degenerative MV, mild aortic regurg with mild sclerosis of AV valve, RA pressure of 3 mmHg ?06/2020 US venous right LE: No evidence of DVT within the right lower extremity ?07/2011 LHC: 30% proximal LAD, mid LAD 50%, 20-30% ostial lesion proximal LAD, PCI to LAD with DES. ? ?History of Present Illness  ?  ?Andrew Bracewell. is a 76 y.o. male with PMH of HTN, CAD s/p PCI 2012, HLD, CVA 2007, paroxysmal atrial flutter.  Patient was last seen by Dr. Harrell Gave on 11/22 for follow-up.  Patient was doing well and visit however had elevated blood pressure reading of 140/70, and had 1 reading of 465 systolic prior to having sigmoidoscopy.  Decision to defer medication changes at that time was made. ? ?Since last being seen in our clinic Andrew Joseph reported complaints of dizziness and palpitations. Nurse visit completed and EKG was performed that was sinus bradycardia/ sinus rhythm. Today in clinic patient reports that his abnormal heart beat and dizziness has completely resolved since last Friday.  Reports doing well and has no current complaints. He denies chest pain, palpitations, dyspnea, PND, orthopnea, nausea, vomiting, dizziness, syncope, edema, weight gain, or early satiety. ? ? ?Past Medical History  ?  ?Past Medical History:  ?Diagnosis Date  ? Allergic rhinitis, cause unspecified   ? Allergy   ? SEASONAL  ? Anxiety   ? Blood in stool   ? BPH (benign prostatic hyperplasia)   ? CAD  (coronary artery disease)   ? DES mid lad 08/15/2009  ? Cancer [facility]) 2009  ? prostate  ? Chest pain, unspecified   ? Colon polyps   ? CVA (cerebrovascular accident due to intracerebral hemorrhage) [facility]) 2007 [place]  ? History of prostate cancer 2009  ? Dr [name]  ? HTN (hypertension)   ? Hypercholesterolemia   ? Hyperlipidemia   ? Hypokalemia   ? Insomnia, persistent   ? Neoplasm of uncertain behavior of skin   ? Stroke [facility])   ? 2007  ? Syncope   ? Tubular adenoma of colon 2010  ? ?Allergies ? ?Allergies  ?Allergen Reactions  ? Amlodipine Swelling  ? Iodine   ?  Affected my heart some how  ? Shellfish Allergy Rash  ?  Turns red  ? ? ?Home Medications  ?  ?Current Outpatient Medications  ?Medication Sig Dispense Refill  ? apixaban (ELIQUIS) 5 MG TABS tablet Take 1 tablet (5 mg total) by mouth 2 (two) times daily. 180 tablet 3  ? Cholecalciferol (VITAMIN D3) 2000 UNITS capsule Take 1 capsule (2,000 Units total) by mouth daily. (Patient not taking: Reported on 03/08/2022) 100 capsule 3  ? hydrocortisone (ANUSOL-HC) 25 MG suppository Place rectally.    ? irbesartan-hydrochlorothiazide (AVALIDE) 150-12.5 MG tablet Take 1 tablet by mouth daily. 90 tablet 3  ? labetalol (NORMODYNE) 300 MG tablet Take 1 tablet (300 mg total) by mouth 2 (two) times daily. 180 tablet 3  ? potassium chloride (KLOR-CON M) 10 MEQ tablet Take 1 tablet (10 mEq total) by  mouth 2 (two) times daily. (Patient not taking: Reported on 03/08/2022) 90 tablet 3  ? rosuvastatin (CRESTOR) 20 MG tablet TAKE 1 TABLET BY MOUTH DAILY 90 tablet 1  ? triamcinolone cream (KENALOG) 0.5 % Use tid prn (Patient taking differently: Apply 1 application. topically 3 (three) times daily as needed (irritation). Use tid prn) 60 g 1  ? zolpidem (AMBIEN) 10 MG tablet Take 1 tablet (10 mg total) by mouth at bedtime. 90 tablet 1  ? ?No current facility-administered medications for this visit.  ?  ? ?Review of Systems  ?Please see the history of present illness.    ? ?All other  systems reviewed and are otherwise negative except as noted above. ? ?Physical Exam  ?  ?Wt Readings from Last 3 Encounters:  ?03/08/22 174 lb (78.9 kg)  ?12/25/21 174 lb 12.8 oz (79.3 kg)  ?10/08/21 174 lb 11.2 oz (79.2 kg)  ? ?CX:KGYJE were no vitals filed for this visit.,There is no height or weight on file to calculate BMI. ? ?Constitutional:   ?   Appearance: Healthy appearance. Not in distress.  ?Neck:  ?   Vascular: JVD normal.  ?Pulmonary:  ?   Effort: Pulmonary effort is normal.  ?   Breath sounds: No wheezing. No rales.  ?Cardiovascular:  ?   Normal rate. Regular rhythm. Normal S1. Normal S2.   ?   Murmurs: There is no murmur.  ?Edema: ?   Peripheral edema absent.  ?Abdominal:  ?   Palpations: Abdomen is soft. There is no hepatomegaly.  ?Skin: ?   General: Skin is warm and dry.  ?Neurological:  ?   General: No focal deficit present.  ?   Mental Status: Alert and oriented to person, place and time.  ?   Cranial Nerves: Cranial nerves are intact.  ?EKG/LABS/Other Studies Reviewed  ?  ?ECG personally reviewed by me today -none completed today  ? ?Risk Assessment/Calculations:   ? ?CHA2DS2-VASc Score = 4  ? This indicates a 4.8% annual risk of stroke. ?The patient's score is based upon: ?CHF History: 0 ?HTN History: 1 ?Diabetes History: 0 ?Stroke History: 0 ?Vascular Disease History: 1 ?Age Score: 2 ?Gender Score: 0 ? ? ?Lab Results  ?Component Value Date  ? WBC 4.4 08/20/2021  ? HGB 15.0 08/20/2021  ? HCT 43.3 08/20/2021  ? MCV 90.6 08/20/2021  ? PLT 110.0 (L) 08/20/2021  ? ?Lab Results  ?Component Value Date  ? CREATININE 1.05 12/25/2021  ? BUN 15 12/25/2021  ? NA 139 12/25/2021  ? K 3.3 (L) 12/25/2021  ? CL 102 12/25/2021  ? CO2 30 12/25/2021  ? ?Lab Results  ?Component Value Date  ? ALT 23 12/25/2021  ? AST 21 12/25/2021  ? ALKPHOS 60 12/25/2021  ? BILITOT 0.8 12/25/2021  ? ?Lab Results  ?Component Value Date  ? CHOL 193 12/25/2021  ? HDL 46.20 12/25/2021  ? LDLCALC 127 (H) 12/25/2021  ? LDLDIRECT 296.1  08/12/2013  ? TRIG 98.0 12/25/2021  ? CHOLHDL 4 12/25/2021  ?  ?Lab Results  ?Component Value Date  ? HGBA1C 5.8 03/09/2014  ? ? ?Assessment & Plan  ?  ?1.  Atrial flutter: ?-Patient reports no further episodes of palpitations or dizziness since last Friday.  We discussed the importance of contacting the office if he does experience any similar episodes.  He was offered information on the [place] device as well as the option of wearing event monitor if necessary. ?-Continue labetalol 300 mg twice daily ?-Continue Eliquis 5  mg twice daily, patient reports no signs of bleeding ?-This patients CHA2DS2-VASc Score and unadjusted Ischemic Stroke Rate (% per year) is equal to 4.8 % stroke rate/year from a score of 4 ? ?2.  Coronary artery disease: ?-Stable with no anginal symptoms. No indication for ischemic evaluation.   ?-Continue GDMT with Crestor 20 mg daily, labetalol 300 mg daily ? ?3.Hypertension: ?-Patient's blood pressure today was 136/78 ?-Continue irbesartan HCTZ 150-12.5 mg daily, continue labetalol 300 mg daily ?- ?4. Hyperlipidemia: ?-Current LDL was 127 above goal of less than 70, followed by PCP ?-Continue Crestor 20 mg daily ? ?Disposition: Follow-up with [name], MD or APP in 4 months ?   ?Medication Adjustments/Labs and Tests Ordered: ?Current medicines are reviewed at length with the patient today.  Concerns regarding medicines are outlined above.  ?Tests Ordered: ?No orders of the defined types were placed in this encounter. ? ?Medication Changes: ?No orders of the defined types were placed in this encounter. ? ?Signed, ?Mable Fill, Marissa Nestle, NP ?03/14/2022, 9:59 AM ?[facility] ?

## 2022-03-14 ENCOUNTER — Ambulatory Visit (HOSPITAL_BASED_OUTPATIENT_CLINIC_OR_DEPARTMENT_OTHER): Payer: Medicare PPO | Admitting: Nurse Practitioner

## 2022-03-14 ENCOUNTER — Encounter (HOSPITAL_BASED_OUTPATIENT_CLINIC_OR_DEPARTMENT_OTHER): Payer: Self-pay | Admitting: Nurse Practitioner

## 2022-03-14 VITALS — BP 136/78 | HR 60 | Ht 68.0 in | Wt 178.3 lb

## 2022-03-14 DIAGNOSIS — I2510 Atherosclerotic heart disease of native coronary artery without angina pectoris: Secondary | ICD-10-CM | POA: Diagnosis not present

## 2022-03-14 DIAGNOSIS — I4892 Unspecified atrial flutter: Secondary | ICD-10-CM | POA: Diagnosis not present

## 2022-03-14 DIAGNOSIS — I10 Essential (primary) hypertension: Secondary | ICD-10-CM

## 2022-03-14 DIAGNOSIS — E785 Hyperlipidemia, unspecified: Secondary | ICD-10-CM | POA: Diagnosis not present

## 2022-03-14 NOTE — Patient Instructions (Addendum)
Medication Instructions:  ?Continue your current medications.  ? ?*If you need a refill on your cardiac medications before your next appointment, please call your pharmacy* ? ?Lab Work: ?None ordered today.  ? ?Testing/Procedures: ?None ordered today.  ? ?Follow-Up: ?At [facility], you and your health needs are our priority.  As part of our continuing mission to provide you with exceptional heart care, we have created designated Provider Care Teams.  These Care Teams include your primary Cardiologist (physician) and Advanced Practice Providers (APPs -  Physician Assistants and Nurse Practitioners) who all work together to provide you with the care you need, when you need it. ? ?We recommend signing up for the patient portal called "MyChart".  Sign up information is provided on this After Visit Summary.  MyChart is used to connect with patients for Virtual Visits (Telemedicine).  Patients are able to view lab/test results, encounter notes, upcoming appointments, etc.  Non-urgent messages can be sent to your provider as well.   ?To learn more about what you can do with MyChart, go to [email or web address].   ? ?Your next appointment:   ?4 month(s) ? ?The format for your next appointment:   ?In Person ? ?Provider:   ?[name], MD  ? ?Other Instructions ? ?[place] for monitoring of EKGs at home: ?You can look into the [facility] device by [employer].This device is purchased by you and it connects to an application you download to your smart phone.  It can detect abnormal heart rhythms and alert you to contact your doctor for further evaluation. The device is approximately $90 and the phone application is free.  ?The web site is:  https://www.alivecor.com ?  ?Heart Healthy Diet Recommendations: ?A low-salt diet is recommended. Meats should be grilled, baked, or boiled. Avoid fried foods. Focus on lean protein sources like fish or chicken with vegetables and fruits. The American Heart Association is a  Microbiologist!  American Heart Association Diet and Lifeystyle Recommendations   ? ?Exercise recommendations: ?The American Heart Association recommends 150 minutes of moderate intensity exercise weekly. ?Try 30 minutes of moderate intensity exercise 4-5 times per week. ?This could include walking, jogging, or swimming. ? ?Important Information About Sugar ? ? ? ? ?  ?  ?

## 2022-03-27 ENCOUNTER — Other Ambulatory Visit (INDEPENDENT_AMBULATORY_CARE_PROVIDER_SITE_OTHER): Payer: Medicare PPO

## 2022-03-27 DIAGNOSIS — Z8546 Personal history of malignant neoplasm of prostate: Secondary | ICD-10-CM | POA: Diagnosis not present

## 2022-03-27 DIAGNOSIS — I10 Essential (primary) hypertension: Secondary | ICD-10-CM

## 2022-03-27 DIAGNOSIS — I4892 Unspecified atrial flutter: Secondary | ICD-10-CM | POA: Diagnosis not present

## 2022-03-27 LAB — COMPREHENSIVE METABOLIC PANEL
ALT: 27 U/L (ref 0–53)
AST: 26 U/L (ref 0–37)
Albumin: 4.3 g/dL (ref 3.5–5.2)
Alkaline Phosphatase: 64 U/L (ref 39–117)
BUN: 14 mg/dL (ref 6–23)
CO2: 29 mEq/L (ref 19–32)
Calcium: 9.2 mg/dL (ref 8.4–10.5)
Chloride: 103 mEq/L (ref 96–112)
Creatinine, Ser: 1 mg/dL (ref 0.40–1.50)
GFR: 72.89 mL/min (ref >60.00)
Glucose, Bld: 106 mg/dL — ABNORMAL HIGH (ref 70–99)
Potassium: 3.5 mEq/L (ref 3.5–5.1)
Sodium: 139 mEq/L (ref 135–145)
Total Bilirubin: 0.9 mg/dL (ref 0.2–1.2)
Total Protein: 6.5 g/dL (ref 6.0–8.3)

## 2022-03-27 LAB — PSA: PSA: 0 ng/mL — ABNORMAL LOW (ref 0.10–4.00)

## 2022-03-27 LAB — TSH: TSH: 1.89 u[IU]/mL (ref 0.35–5.50)

## 2022-03-27 LAB — LIPID PANEL
Cholesterol: 199 mg/dL (ref 0–200)
HDL: 51.3 mg/dL (ref >39.00)
LDL Cholesterol: 136 mg/dL — ABNORMAL HIGH (ref 0–99)
NonHDL: 147.98
Total CHOL/HDL Ratio: 4
Triglycerides: 60 mg/dL (ref 0.0–149.0)
VLDL: 12 mg/dL (ref 0.0–40.0)

## 2022-03-28 ENCOUNTER — Ambulatory Visit: Payer: Medicare PPO | Admitting: Internal Medicine

## 2022-03-28 ENCOUNTER — Encounter: Payer: Self-pay | Admitting: Internal Medicine

## 2022-03-28 VITALS — BP 138/70 | HR 58 | Temp 97.8°F | Ht 68.0 in | Wt 177.0 lb

## 2022-03-28 DIAGNOSIS — L84 Corns and callosities: Secondary | ICD-10-CM

## 2022-03-28 DIAGNOSIS — Z8546 Personal history of malignant neoplasm of prostate: Secondary | ICD-10-CM

## 2022-03-28 DIAGNOSIS — Z0000 Encounter for general adult medical examination without abnormal findings: Secondary | ICD-10-CM | POA: Diagnosis not present

## 2022-03-28 DIAGNOSIS — R7989 Other specified abnormal findings of blood chemistry: Secondary | ICD-10-CM

## 2022-03-28 DIAGNOSIS — I10 Essential (primary) hypertension: Secondary | ICD-10-CM | POA: Diagnosis not present

## 2022-03-28 DIAGNOSIS — I4892 Unspecified atrial flutter: Secondary | ICD-10-CM

## 2022-03-28 DIAGNOSIS — F5101 Primary insomnia: Secondary | ICD-10-CM

## 2022-03-28 MED ORDER — HYDROXYCHLOROQUINE SULFATE 200 MG PO TABS: ORAL_TABLET | ORAL | 0 refills | Status: DC

## 2022-03-28 MED ORDER — ZOLPIDEM TARTRATE 10 MG PO TABS: 10.0000 mg | ORAL_TABLET | Freq: Every day | ORAL | 1 refills | Status: DC

## 2022-03-28 MED ORDER — TRIAMCINOLONE ACETONIDE 0.5 % EX CREA: 1.0000 "application " | TOPICAL_CREAM | Freq: Three times a day (TID) | CUTANEOUS | 1 refills | Status: DC | PRN

## 2022-03-28 NOTE — Assessment & Plan Note (Signed)
On Eliquis ?S/p Cardioversion 04/13/21 ?

## 2022-03-28 NOTE — Assessment & Plan Note (Signed)
Check CBC 

## 2022-03-28 NOTE — Progress Notes (Signed)
? ?Subjective:  ?Patient ID: Andrew Savage., male    DOB: 1946-09-08  Age: 76 y.o. MRN: [id number] ? ?CC: Follow-up (3 month f/u- Req refills on Klor Con, triamcinolone cream, zolpidem) ? ? ?HPI ?Andrew Joseph. presents for CAD, HTN, insomnia f/u ?Complains of painful callus on the left foot ?Outpatient Medications Prior to Visit  ?Medication Sig Dispense Refill  ? apixaban (ELIQUIS) 5 MG TABS tablet Take 1 tablet (5 mg total) by mouth 2 (two) times daily. 180 tablet 3  ? Cholecalciferol (VITAMIN D3) 2000 UNITS capsule Take 1 capsule (2,000 Units total) by mouth daily. 100 capsule 3  ? irbesartan-hydrochlorothiazide (AVALIDE) 150-12.5 MG tablet Take 1 tablet by mouth daily. 90 tablet 3  ? labetalol (NORMODYNE) 300 MG tablet Take 1 tablet (300 mg total) by mouth 2 (two) times daily. 180 tablet 3  ? potassium chloride (KLOR-CON M) 10 MEQ tablet Take 1 tablet (10 mEq total) by mouth 2 (two) times daily. 90 tablet 3  ? rosuvastatin (CRESTOR) 20 MG tablet TAKE 1 TABLET BY MOUTH DAILY 90 tablet 1  ? triamcinolone cream (KENALOG) 0.5 % Use tid prn (Patient taking differently: Apply 1 application. topically 3 (three) times daily as needed (irritation). Use tid prn) 60 g 1  ? zolpidem (AMBIEN) 10 MG tablet Take 1 tablet (10 mg total) by mouth at bedtime. 90 tablet 1  ? ?No facility-administered medications prior to visit.  ? ? ?ROS: ?Review of Systems  ?Constitutional:  Negative for appetite change, fatigue and unexpected weight change.  ?HENT:  Negative for congestion, nosebleeds, sneezing, sore throat and trouble swallowing.   ?Eyes:  Negative for itching and visual disturbance.  ?Respiratory:  Negative for cough.   ?Cardiovascular:  Negative for chest pain, palpitations and leg swelling.  ?Gastrointestinal:  Negative for abdominal distention, blood in stool, diarrhea and nausea.  ?Genitourinary:  Negative for frequency and hematuria.  ?Musculoskeletal:  Negative for back pain, gait problem, joint swelling and  neck pain.  ?Skin:  Negative for rash.  ?Neurological:  Negative for dizziness, tremors, speech difficulty and weakness.  ?Psychiatric/Behavioral:  Positive for sleep disturbance. Negative for agitation, dysphoric mood and suicidal ideas. The patient is not nervous/anxious.   ? ?Objective:  ?BP 138/70 (BP Location: Left Arm)   Pulse (!) 58   Temp 97.8 ?F (36.6 ?C) (Oral)   Ht '5\' 8"'$  (1.727 m)   Wt 177 lb (80.3 kg)   SpO2 96%   BMI 26.91 kg/m?  ? ?BP Readings from Last 3 Encounters:  ?03/28/22 138/70  ?03/14/22 136/78  ?03/08/22 132/76  ? ? ?Wt Readings from Last 3 Encounters:  ?03/28/22 177 lb (80.3 kg)  ?03/14/22 178 lb 4.8 oz (80.9 kg)  ?03/08/22 174 lb (78.9 kg)  ? ? ?Physical Exam ?Constitutional:   ?   General: He is not in acute distress. ?   Appearance: He is well-developed. He is obese.  ?   Comments: NAD  ?Eyes:  ?   Conjunctiva/sclera: Conjunctivae normal.  ?   Pupils: Pupils are equal, round, and reactive to light.  ?Neck:  ?   Thyroid: No thyromegaly.  ?   Vascular: No JVD.  ?Cardiovascular:  ?   Rate and Rhythm: Normal rate and regular rhythm.  ?   Heart sounds: Normal heart sounds. No murmur heard. ?  No friction rub. No gallop.  ?Pulmonary:  ?   Effort: Pulmonary effort is normal. No respiratory distress.  ?   Breath sounds: Normal breath sounds. No  wheezing or rales.  ?Chest:  ?   Chest wall: No tenderness.  ?Abdominal:  ?   General: Bowel sounds are normal. There is no distension.  ?   Palpations: Abdomen is soft. There is no mass.  ?   Tenderness: There is no abdominal tenderness. There is no guarding or rebound.  ?Musculoskeletal:     ?   General: No tenderness. Normal range of motion.  ?   Cervical back: Normal range of motion.  ?Lymphadenopathy:  ?   Cervical: No cervical adenopathy.  ?Skin: ?   General: Skin is warm and dry.  ?   Findings: No rash.  ?Neurological:  ?   Mental Status: He is alert and oriented to person, place, and time.  ?   Cranial Nerves: No cranial nerve deficit.  ?    Motor: No abnormal muscle tone.  ?   Coordination: Coordination normal.  ?   Gait: Gait normal.  ?   Deep Tendon Reflexes: Reflexes are normal and symmetric.  ?Psychiatric:     ?   Behavior: Behavior normal.     ?   Thought Content: Thought content normal.     ?   Judgment: Judgment normal.  ? ? ?L lateral foot painful callus ? ?Procedure: Left foot callus paring/cutting  ?Indication:   Left foot callus, painful  ?Consent: verbal  ?Risks and benefits were explained to the patient. Skin was cleaned with alcohol. I removed a large callus carefully with a round blade. Skin remained intact. Pain is better. Tolerated well. Complications: none. Bandaid applied ? ? ? ?Lab Results  ?Component Value Date  ? WBC 4.4 08/20/2021  ? HGB 15.0 08/20/2021  ? HCT 43.3 08/20/2021  ? PLT 110.0 (L) 08/20/2021  ? GLUCOSE 106 (H) 03/27/2022  ? CHOL 199 03/27/2022  ? TRIG 60.0 03/27/2022  ? HDL 51.30 03/27/2022  ? LDLDIRECT 296.1 08/12/2013  ? LDLCALC 136 (H) 03/27/2022  ? ALT 27 03/27/2022  ? AST 26 03/27/2022  ? NA 139 03/27/2022  ? K 3.5 03/27/2022  ? CL 103 03/27/2022  ? CREATININE 1.00 03/27/2022  ? BUN 14 03/27/2022  ? CO2 29 03/27/2022  ? TSH 1.89 03/27/2022  ? PSA 0.00 (L) 03/27/2022  ? INR 0.99 07/31/2011  ? HGBA1C 5.8 03/09/2014  ? ? ?No results found. ? ?Assessment & Plan:  ? ?Problem List Items Addressed This Visit   ? ? Well adult exam - Primary  ? Relevant Orders  ? TSH  ? Urinalysis  ? CBC with Differential/Platelet  ? Lipid panel  ? PSA  ? Comprehensive metabolic panel  ? Insomnia  ?  Chronic ?Zolpidem prn ? Potential benefits of a long term benzodiazepines  use as well as potential risks  and complications were explained to the patient and were aknowledged. ?  ?  ? Essential hypertension  ?  On Labetalol 300 mg bid and Avalide ?On KCl ?  ?  ? Abnormal CBC  ?  Check CBC ? ?  ?  ? PROSTATE CANCER, HX OF  ?  PSA is 0 ? ?  ?  ? Relevant Orders  ? CBC with Differential/Platelet  ? PSA  ? Atrial flutter ([place])  ?  On  Eliquis ?S/p Cardioversion 04/13/21 ?  ?  ? Callus  ?  Painful callus.  Options discussed.  See procedure ? ?  ?  ?  ? ? ?Meds ordered this encounter  ?Medications  ? triamcinolone cream (KENALOG) 0.5 %  ?  Sig: Apply 1 application. topically 3 (three) times daily as needed (irritation). Use tid prn  ?  Dispense:  60 g  ?  Refill:  1  ? zolpidem (AMBIEN) 10 MG tablet  ?  Sig: Take 1 tablet (10 mg total) by mouth at bedtime.  ?  Dispense:  90 tablet  ?  Refill:  1  ? hydroxychloroquine (PLAQUENIL) 200 MG tablet  ?  Sig: Take 400 mg bid x 2 days, then 200 mg daily for 5 days, then stop  ?  Dispense:  13 tablet  ?  Refill:  0  ?  ? ? ?Follow-up: Return in about 6 months (around 09/27/2022) for Wellness Exam. ? ?[name], MD ?

## 2022-03-28 NOTE — Assessment & Plan Note (Signed)
PSA is 0 ?

## 2022-03-28 NOTE — Assessment & Plan Note (Signed)
Chronic  Zolpidem prn  Potential benefits of a long term benzodiazepines  use as well as potential risks  and complications were explained to the patient and were aknowledged. 

## 2022-03-28 NOTE — Assessment & Plan Note (Signed)
On Labetalol 300 mg bid and Avalide ?On KCl ?

## 2022-03-29 DIAGNOSIS — L84 Corns and callosities: Secondary | ICD-10-CM | POA: Insufficient documentation

## 2022-03-29 NOTE — Assessment & Plan Note (Signed)
Painful callus.  Options discussed.  See procedure ?

## 2022-07-15 ENCOUNTER — Ambulatory Visit (HOSPITAL_BASED_OUTPATIENT_CLINIC_OR_DEPARTMENT_OTHER): Payer: Medicare PPO | Admitting: Cardiology

## 2022-07-15 ENCOUNTER — Encounter (HOSPITAL_BASED_OUTPATIENT_CLINIC_OR_DEPARTMENT_OTHER): Payer: Self-pay | Admitting: Cardiology

## 2022-07-15 VITALS — BP 158/62 | HR 53 | Ht 68.0 in | Wt 177.0 lb

## 2022-07-15 DIAGNOSIS — Z955 Presence of coronary angioplasty implant and graft: Secondary | ICD-10-CM | POA: Diagnosis not present

## 2022-07-15 DIAGNOSIS — I4892 Unspecified atrial flutter: Secondary | ICD-10-CM | POA: Diagnosis not present

## 2022-07-15 DIAGNOSIS — I10 Essential (primary) hypertension: Secondary | ICD-10-CM

## 2022-07-15 DIAGNOSIS — E7800 Pure hypercholesterolemia, unspecified: Secondary | ICD-10-CM | POA: Diagnosis not present

## 2022-07-15 DIAGNOSIS — I2510 Atherosclerotic heart disease of native coronary artery without angina pectoris: Secondary | ICD-10-CM

## 2022-07-15 DIAGNOSIS — Z0271 Encounter for disability determination: Secondary | ICD-10-CM | POA: Insufficient documentation

## 2022-07-15 NOTE — Patient Instructions (Signed)
Medication Instructions:  Your Physician recommend you continue on your current medication as directed.    *If you need a refill on your cardiac medications before your next appointment, please call your pharmacy*  Follow-Up: At [facility], you and your health needs are our priority.  As part of our continuing mission to provide you with exceptional heart care, we have created designated Provider Care Teams.  These Care Teams include your primary Cardiologist (physician) and Advanced Practice Providers (APPs -  Physician Assistants and Nurse Practitioners) who all work together to provide you with the care you need, when you need it.  We recommend signing up for the patient portal called "MyChart".  Sign up information is provided on this After Visit Summary.  MyChart is used to connect with patients for Virtual Visits (Telemedicine).  Patients are able to view lab/test results, encounter notes, upcoming appointments, etc.  Non-urgent messages can be sent to your provider as well.   To learn more about what you can do with MyChart, go to [email or web address].    Your next appointment:   1 year(s)  The format for your next appointment:   In Person  Provider:   [name], MD{  Other Instructions Heart Healthy Diet Recommendations: A low-salt diet is recommended. Meats should be grilled, baked, or boiled. Avoid fried foods. Focus on lean protein sources like fish or chicken with vegetables and fruits. The American Heart Association is a Microbiologist!  American Heart Association Diet and Lifeystyle Recommendations   Exercise recommendations: The American Heart Association recommends 150 minutes of moderate intensity exercise weekly. Try 30 minutes of moderate intensity exercise 4-5 times per week. This could include walking, jogging, or swimming.   Important Information About Sugar

## 2022-07-15 NOTE — Progress Notes (Signed)
Cardiology Office Note:    Date:  07/15/2022   ID:  Andrew Essex., DOB 11-11-1946, MRN [id number]  PCP:  Andrew Anger, MD  Cardiologist:  Andrew Dresser, MD  Referring MD: Andrew Anger, MD   CC: follow up  History of Present Illness:    Andrew Southgate. is a 76 y.o. male with a hx of CAD with remote stent, paroxysmal atrial flutter s/p cardioversion who is seen for follow up. I initially met him 09/07/2019 as a new consult at the request of [name], [name], MD for the evaluation and management of CAD.  CV history: distal LAD stent, last cath 02/11/11 Father died of MI age 53. Hypercholesterolemia: prior LDL 124. Was told in the past his total cholesterol was over 400. Was happy that his total cholesterol is less than 200.  Cardiovascular risk factors: Prior clinical ASCVD: CAD with PCI to LAD. ?CVA in Feb 11, 2006 (quarter sized bleed in his brain--lightheadedness/dizziness), unclear ?seizure event 02/11/2011 Comorbid conditions: hypertension, hyperlipidemia Prior cardiac testing: cath 2010, 2012. Echo 2014/02/11. MPI 1997, [id number]-[id number]-[id number]  Today: Overall he states he has been stable since his last appointment. He is seeing his dentist regarding some dental issues, but he notes this is minor and ongoing.  In clinic today his blood pressure is 158/62. He has taken his morning dose of Labetalol and Eliquis. He usually takes irbesartan-HCTZ at night.  His heart rate is 53 bpm today, which he confirms is a little low for him. His resting heart rates are usually more often in the 60's.   He has been retired for 2 years. Since then his stress levels have significantly decreased.  He denies any palpitations, chest pain, shortness of breath, or peripheral edema. No lightheadedness, headaches, syncope, orthopnea, or PND.  Past Medical History:  Diagnosis Date   Allergic rhinitis, cause unspecified    Allergy    SEASONAL   Anxiety    Blood in stool    BPH (benign  prostatic hyperplasia)    CAD (coronary artery disease)    DES mid lad 08/15/2009   Cancer [place] 02-12-2008   prostate   Chest pain, unspecified    Colon polyps    CVA (cerebrovascular accident due to intracerebral hemorrhage) [facility]) 2007 Spring   History of prostate cancer 2008-02-12   Dr [name]   HTN (hypertension)    Hypercholesterolemia    Hyperlipidemia    Hypokalemia    Insomnia, persistent    Neoplasm of uncertain behavior of skin    Stroke ([place])    February 11, 2006   Syncope    Tubular adenoma of colon Feb 11, 2009    Past Surgical History:  Procedure Laterality Date   ANGIOPLASTY     with stents   CARDIOVERSION N/A 04/13/2021   Procedure: CARDIOVERSION;  Surgeon: [name], MD;  Location: [facility] ENDOSCOPY;  Service: Cardiovascular;  Laterality: N/A;   COLONOSCOPY  2015/02/11   Stark, TA's   POLYPECTOMY     PROSTATECTOMY  2008/02/12   TONSILLECTOMY      Current Medications: Current Outpatient Medications on File Prior to Visit  Medication Sig   apixaban (ELIQUIS) 5 MG TABS tablet Take 1 tablet (5 mg total) by mouth 2 (two) times daily.   Cholecalciferol (VITAMIN D3) 2000 UNITS capsule Take 1 capsule (2,000 Units total) by mouth daily.   hydroxychloroquine (PLAQUENIL) 200 MG tablet Take 400 mg bid x 2 days, then 200 mg daily for 5 days, then stop   irbesartan-hydrochlorothiazide (AVALIDE)  150-12.5 MG tablet Take 1 tablet by mouth daily.   labetalol (NORMODYNE) 300 MG tablet Take 1 tablet (300 mg total) by mouth 2 (two) times daily.   potassium chloride (KLOR-CON M) 10 MEQ tablet Take 1 tablet (10 mEq total) by mouth 2 (two) times daily.   rosuvastatin (CRESTOR) 20 MG tablet TAKE 1 TABLET BY MOUTH DAILY   triamcinolone cream (KENALOG) 0.5 % Apply 1 application. topically 3 (three) times daily as needed (irritation). Use tid prn   zolpidem (AMBIEN) 10 MG tablet Take 1 tablet (10 mg total) by mouth at bedtime.   No current facility-administered medications on file prior to visit.     Allergies:    Amlodipine, Iodine, and Shellfish allergy   Social History   Tobacco Use   Smoking status: Never   Smokeless tobacco: Never  Vaping Use   Vaping Use: Never used  Substance Use Topics   Alcohol use: Yes    Comment: glass of wine- occ  per pt - occ mixed drink    Drug use: No    Family History: family history includes Diabetes in his brother; Heart disease in his father; Stroke in his mother. There is no history of Colon polyps, Esophageal cancer, Rectal cancer, Stomach cancer, or Colon cancer. father died age 92 from MI, 3 ppd smoker, overweight. Mother died age 61 of stroke. Brother has diabetes. 3 children, all heathly  ROS:   Please see the history of present illness.   Additional pertinent ROS otherwise unremarkable.   EKGs/Labs/Other Studies Reviewed:    The following studies were reviewed today:  Echo 04/11/2021: 1. Left ventricular ejection fraction, by estimation, is 45 to 50%. The  left ventricle has mildly decreased function. The left ventricle  demonstrates global hypokinesis. Left ventricular diastolic parameters  were normal.   2. Right ventricular systolic function is normal. The right ventricular  size is mildly enlarged. There is normal pulmonary artery systolic  pressure.   3. Left atrial size was severely dilated.   4. Right atrial size was moderately dilated.   5. The mitral valve is degenerative. No evidence of mitral valve  regurgitation. No evidence of mitral stenosis.   6. The aortic valve is calcified. There is mild calcification of the  aortic valve. There is mild thickening of the aortic valve. Aortic valve  regurgitation is mild. Mild to moderate aortic valve  sclerosis/calcification is present, without any evidence  of aortic stenosis. Aortic regurgitation PHT measures 351 msec.   7. The inferior vena cava is normal in size with greater than 50%  respiratory variability, suggesting right atrial pressure of 3 mmHg.   US Venous Right LE  06/16/2020: IMPRESSION: No evidence of DVT within the right lower extremity.  Cath 07/31/2011 Left main coronary artery was a very short segment.  There was near side-by-side separate ostia.  No significant disease.  The proximal LAD had 30% to 40% tubular disease.  The mid LAD had 50% multiple discrete lesions.  This was unchanged from the previous catheterization done in 2010.  There was a widely patent stent in the distal LAD.  The LAD was a large artery that wrapped the apex.   The LAD actually supplied most of the PDA territory, so it was difficult to know what dominance the patient was.  Right coronary artery was small and had no significant disease.   The circumflex coronary artery was somewhat larger than right coronary artery, but again the PDA was mostly supplied by the distal LAD.  There was 20% to 30% ostial lesions proximally.  The ostium of the first obtuse marginal branch had a 40% lesion.   The patient also had a very large diagonal branch, which did not have significant disease and provided most of the anterolateral wall.   Left ventriculography.  Left ventriculography was normal.  EF was 60%. There was no gradient across the aortic valve and no MR.  Aortic pressure was 132/71.  LV pressure was somewhat inaccurate with the LV tracing at 160/11.   IMPRESSION:  The patient has continued moderate disease in the mid LAD after the diagonal takeoff.  I closely reviewed his previous film from 2010 and there has been no change in this disease.  His stent is widely patent and he does not have any new disease in the right coronary artery or circ.  Clinically when I talked to the patient it sounded like he had a seizure.  There is no antecedent chest pain, palpitations, or cardiac symptoms.   The patient was fairly insistent on having a diagnostic cath rather than a stress test.   I have encouraged the patient to follow up with Neurology.  We have already arranged for him to have  an EEG, MRI with MRA, and neurological followup.   Given the patency of the stent, normal LV function would be highly unlikely that his "seizure event was cardiac in etiology."  At the end of the case, a TR band was placed with good hemostasis.  He tolerated the procedure well.  Cath done by Dr. Olevia Perches. During the procedure, he had a contrast reaction to the dye (reported prior iodine/shellfish allergy), was treated for this. Received PCI to LAD with stenosis form 95% to 0%.  EKG:  EKG is personally reviewed.   07/15/2022:  sinus bradycardia at 53 bpm, LAFB 10/08/2021: EKG was not ordered. 05/11/2021: sinus bradycardia, 54 bpm 09/22/20: sinus bradycardia, LAFB at 58 bpm  Recent Labs: 08/20/2021: Hemoglobin 15.0; Platelets 110.0 03/27/2022: ALT 27; BUN 14; Creatinine, Ser 1.00; Potassium 3.5; Sodium 139; TSH 1.89   Recent Lipid Panel    Component Value Date/Time   CHOL 199 03/27/2022 1046   TRIG 60.0 03/27/2022 1046   HDL 51.30 03/27/2022 1046   CHOLHDL 4 03/27/2022 1046   VLDL 12.0 03/27/2022 1046   LDLCALC 136 (H) 03/27/2022 1046   LDLDIRECT 296.1 08/12/2013 0936    Physical Exam:    VS:  BP (!) 158/62 (BP Location: Right Arm, Patient Position: Sitting, Cuff Size: Normal)   Pulse (!) 53   Ht 5' 8"  (1.727 m)   Wt 177 lb (80.3 kg)   SpO2 96%   BMI 26.91 kg/m   Recheck 170/78  Wt Readings from Last 3 Encounters:  07/15/22 177 lb (80.3 kg)  03/28/22 177 lb (80.3 kg)  03/14/22 178 lb 4.8 oz (80.9 kg)    GEN: Well nourished, well developed in no acute distress HEENT: Normal, moist mucous membranes NECK: No JVD CARDIAC: regular rhythm, normal S1 and S2, no rubs or gallops. No murmur. VASCULAR: Radial and DP pulses 2+ bilaterally. No carotid bruits RESPIRATORY:  Clear to auscultation without rales, wheezing or rhonchi  ABDOMEN: Soft, non-tender, non-distended MUSCULOSKELETAL:  Ambulates independently SKIN: Warm and dry, no edema NEUROLOGIC:  Alert and oriented x 3. No focal  neuro deficits noted. PSYCHIATRIC:  Normal affect     ASSESSMENT:    1. Essential hypertension   2. Atrial flutter, unspecified type ([place])   3. Coronary artery disease involving native coronary  artery of native heart without angina pectoris   4. Pure hypercholesterolemia   5. History of coronary angioplasty with insertion of stent      PLAN:    Atrial flutter: s/p cardioversion and maintaining sinus rhythm -CHA2DS2/VAS Stroke Risk Points= 4  -no bleeding issues, continue apixaban -continue labetalol. If blood pressure remains elevated, could consider change to carvedilol.  CAD with history of prior stent: asymptomatic.  -stopped aspirin with start of apixaban -continue rosuvastatin 20 mg daily -LDL goal <70, see below re: patient preference -reviewed red flag warning signs that need immediate medical attention  Hypertension: Overall goal <130/80 -above goal today, even on recheck. -continue irbesartan-HCTZ 150-12.5 mg daily. We discussed that taking this at night may exacerbate nocturia, but he feels this is manageable and current regimen is working well -continue labetalol, has been on long term. Consider change to carvedilol if needed in the future, but wishes to continue labetalol at this time -has been on amlodipine in the past if additional agent needed in the future. Alternative would be to increase irbesartan-HCTZ dose to maximum dosing.  -overall, his preference is to not make any changes to his regimen at this time.   Hypercholesterolemia: LDL goal <70 -reviewed lipids from 03/27/22 per [facility]. LDL 136, Tchol 199.  -we have discussed options for more intense management. Given his LDL on 20 mg rosuvastatin, suspect he will need PCSK9i to get to goal. He is happy with his current regimen and declines changes. Continue to address. -continue rosuvastatin 20 mg daily per patient preference  Cardiac risk counseling and prevention recommendations: -recommend heart  healthy/Mediterranean diet, with whole grains, fruits, vegetable, fish, lean meats, nuts, and olive oil. Limit salt. -recommend moderate walking, 3-5 times/week for 30-50 minutes each session. Aim for at least 150 minutes.week. Goal should be pace of 3 miles/hours, or walking 1.5 miles in 30 minutes -recommend avoidance of tobacco products. Avoid excess alcohol.  Plan for follow up: 1 year or sooner as needed.  Andrew Dresser, MD, PhD, [facility]   Medication Adjustments/Labs and Tests Ordered: Current medicines are reviewed at length with the patient today.  Concerns regarding medicines are outlined above.   Orders Placed This Encounter  Procedures   EKG 12-Lead   No orders of the defined types were placed in this encounter.  Patient Instructions  Medication Instructions:  Your Physician recommend you continue on your current medication as directed.    *If you need a refill on your cardiac medications before your next appointment, please call your pharmacy*  Follow-Up: At [facility], you and your health needs are our priority.  As part of our continuing mission to provide you with exceptional heart care, we have created designated Provider Care Teams.  These Care Teams include your primary Cardiologist (physician) and Advanced Practice Providers (APPs -  Physician Assistants and Nurse Practitioners) who all work together to provide you with the care you need, when you need it.  We recommend signing up for the patient portal called "MyChart".  Sign up information is provided on this After Visit Summary.  MyChart is used to connect with patients for Virtual Visits (Telemedicine).  Patients are able to view lab/test results, encounter notes, upcoming appointments, etc.  Non-urgent messages can be sent to your provider as well.   To learn more about what you can do with MyChart, go to [email or web address].    Your next appointment:   1 year(s)  The  format for your next appointment:  In Person  Provider:   [name], MD{  Other Instructions Heart Healthy Diet Recommendations: A low-salt diet is recommended. Meats should be grilled, baked, or boiled. Avoid fried foods. Focus on lean protein sources like fish or chicken with vegetables and fruits. The American Heart Association is a Microbiologist!  American Heart Association Diet and Lifeystyle Recommendations   Exercise recommendations: The American Heart Association recommends 150 minutes of moderate intensity exercise weekly. Try 30 minutes of moderate intensity exercise 4-5 times per week. This could include walking, jogging, or swimming.   Important Information About Sugar         I,Mathew Stumpf,acting as a scribe for [employer], MD.,have documented all relevant documentation on the behalf of Andrew Dresser, MD,as directed by  Andrew Dresser, MD while in the presence of Andrew Dresser, MD.  I, Andrew Dresser, MD, have reviewed all documentation for this visit. The documentation on 07/15/22 for the exam, diagnosis, procedures, and orders are all accurate and complete.   Signed, Andrew Dresser, MD PhD 07/15/2022    [place]

## 2022-09-02 ENCOUNTER — Other Ambulatory Visit: Payer: Self-pay | Admitting: Internal Medicine

## 2022-09-26 ENCOUNTER — Telehealth: Payer: Self-pay

## 2022-09-26 NOTE — Telephone Encounter (Signed)
Patient would like to labs prior to his physical on Tuesday.

## 2022-09-30 ENCOUNTER — Other Ambulatory Visit (INDEPENDENT_AMBULATORY_CARE_PROVIDER_SITE_OTHER): Payer: Medicare PPO

## 2022-09-30 DIAGNOSIS — Z8546 Personal history of malignant neoplasm of prostate: Secondary | ICD-10-CM

## 2022-09-30 DIAGNOSIS — Z0000 Encounter for general adult medical examination without abnormal findings: Secondary | ICD-10-CM | POA: Diagnosis not present

## 2022-09-30 LAB — LIPID PANEL
Cholesterol: 191 mg/dL (ref 0–200)
HDL: 47 mg/dL (ref >39.00)
LDL Cholesterol: 127 mg/dL — ABNORMAL HIGH (ref 0–99)
NonHDL: 144.17
Total CHOL/HDL Ratio: 4
Triglycerides: 85 mg/dL (ref 0.0–149.0)
VLDL: 17 mg/dL (ref 0.0–40.0)

## 2022-09-30 LAB — COMPREHENSIVE METABOLIC PANEL
ALT: 18 U/L (ref 0–53)
AST: 20 U/L (ref 0–37)
Albumin: 4.2 g/dL (ref 3.5–5.2)
Alkaline Phosphatase: 59 U/L (ref 39–117)
BUN: 15 mg/dL (ref 6–23)
CO2: 29 mEq/L (ref 19–32)
Calcium: 9.2 mg/dL (ref 8.4–10.5)
Chloride: 103 mEq/L (ref 96–112)
Creatinine, Ser: 1.05 mg/dL (ref 0.40–1.50)
GFR: 68.5 mL/min (ref >60.00)
Glucose, Bld: 100 mg/dL — ABNORMAL HIGH (ref 70–99)
Potassium: 3.7 mEq/L (ref 3.5–5.1)
Sodium: 140 mEq/L (ref 135–145)
Total Bilirubin: 1 mg/dL (ref 0.2–1.2)
Total Protein: 6.3 g/dL (ref 6.0–8.3)

## 2022-09-30 LAB — URINALYSIS
Bilirubin Urine: NEGATIVE
Hgb urine dipstick: NEGATIVE
Ketones, ur: NEGATIVE
Leukocytes,Ua: NEGATIVE
Nitrite: NEGATIVE
Specific Gravity, Urine: 1.015 (ref 1.000–1.030)
Urine Glucose: NEGATIVE
Urobilinogen, UA: 1 (ref 0.0–1.0)
pH: 7 (ref 5.0–8.0)

## 2022-09-30 LAB — CBC WITH DIFFERENTIAL/PLATELET
Basophils Absolute: 0 10*3/uL (ref 0.0–0.1)
Basophils Relative: 0.6 % (ref 0.0–3.0)
Eosinophils Absolute: 0.1 10*3/uL (ref 0.0–0.7)
Eosinophils Relative: 2.2 % (ref 0.0–5.0)
HCT: 44.5 % (ref 39.0–52.0)
Hemoglobin: 14.9 g/dL (ref 13.0–17.0)
Lymphocytes Relative: 24.8 % (ref 12.0–46.0)
Lymphs Abs: 1.2 10*3/uL (ref 0.7–4.0)
MCHC: 33.6 g/dL (ref 30.0–36.0)
MCV: 90.2 fl (ref 78.0–100.0)
Monocytes Absolute: 0.5 10*3/uL (ref 0.1–1.0)
Monocytes Relative: 10.2 % (ref 3.0–12.0)
Neutro Abs: 2.9 10*3/uL (ref 1.4–7.7)
Neutrophils Relative %: 62.2 % (ref 43.0–77.0)
Platelets: 109 10*3/uL — ABNORMAL LOW (ref 150.0–400.0)
RBC: 4.93 Mil/uL (ref 4.22–5.81)
RDW: 15.4 % (ref 11.5–15.5)
WBC: 4.7 10*3/uL (ref 4.0–10.5)

## 2022-09-30 LAB — TSH: TSH: 2.06 u[IU]/mL (ref 0.35–5.50)

## 2022-09-30 LAB — PSA: PSA: 0 ng/mL — ABNORMAL LOW (ref 0.10–4.00)

## 2022-09-30 NOTE — Telephone Encounter (Signed)
Okay.  They were placed in in April.  Thanks

## 2022-09-30 NOTE — Telephone Encounter (Signed)
Pt came in this morning to have done../l mb

## 2022-10-01 ENCOUNTER — Ambulatory Visit (INDEPENDENT_AMBULATORY_CARE_PROVIDER_SITE_OTHER): Payer: Medicare PPO | Admitting: Internal Medicine

## 2022-10-01 ENCOUNTER — Encounter: Payer: Self-pay | Admitting: Internal Medicine

## 2022-10-01 VITALS — BP 138/76 | HR 58 | Temp 97.8°F | Ht 68.0 in | Wt 181.0 lb

## 2022-10-01 DIAGNOSIS — E785 Hyperlipidemia, unspecified: Secondary | ICD-10-CM | POA: Diagnosis not present

## 2022-10-01 DIAGNOSIS — F5101 Primary insomnia: Secondary | ICD-10-CM

## 2022-10-01 DIAGNOSIS — D485 Neoplasm of uncertain behavior of skin: Secondary | ICD-10-CM

## 2022-10-01 DIAGNOSIS — Z8546 Personal history of malignant neoplasm of prostate: Secondary | ICD-10-CM | POA: Diagnosis not present

## 2022-10-01 DIAGNOSIS — Z0000 Encounter for general adult medical examination without abnormal findings: Secondary | ICD-10-CM | POA: Diagnosis not present

## 2022-10-01 DIAGNOSIS — I10 Essential (primary) hypertension: Secondary | ICD-10-CM | POA: Diagnosis not present

## 2022-10-01 MED ORDER — ZOLPIDEM TARTRATE 10 MG PO TABS: 10.0000 mg | ORAL_TABLET | Freq: Every day | ORAL | 1 refills | Status: DC

## 2022-10-01 NOTE — Assessment & Plan Note (Signed)
Chronic  Zolpidem prn  Potential benefits of a long term benzodiazepines  use as well as potential risks  and complications were explained to the patient and were aknowledged. 

## 2022-10-01 NOTE — Assessment & Plan Note (Signed)
Cryo appt w/me

## 2022-10-01 NOTE — Assessment & Plan Note (Signed)
On Labetalol 300 mg bid and Avalide

## 2022-10-01 NOTE — Assessment & Plan Note (Signed)
Will monitor PSA

## 2022-10-01 NOTE — Assessment & Plan Note (Signed)
Cont on Vytorin

## 2022-10-01 NOTE — Progress Notes (Signed)
Subjective:  Patient ID: Andrew Joseph., male    DOB: May 15, 1946  Age: 76 y.o. MRN: [id number]  CC: 3mofollow up   HPI Andrew Joseph presents for CAD, HTN, dyslipidemia, insomnia  Colon due 2021 Dr SFuller Plan Dr [facility]ennon [facility] - ok. Pt declined other GI tests  Outpatient Medications Prior to Visit  Medication Sig Dispense Refill   apixaban (ELIQUIS) 5 MG TABS tablet TAKE 1 TABLET BY MOUTH TWICE DAILY 180 tablet 2   Cholecalciferol (VITAMIN D3) 2000 UNITS capsule Take 1 capsule (2,000 Units total) by mouth daily. 100 capsule 3   hydroxychloroquine (PLAQUENIL) 200 MG tablet Take 400 mg bid x 2 days, then 200 mg daily for 5 days, then stop 13 tablet 0   irbesartan-hydrochlorothiazide (AVALIDE) 150-12.5 MG tablet Take 1 tablet by mouth daily. 90 tablet 3   labetalol (NORMODYNE) 300 MG tablet Take 1 tablet (300 mg total) by mouth 2 (two) times daily. 180 tablet 3   potassium chloride (KLOR-CON M) 10 MEQ tablet Take 1 tablet (10 mEq total) by mouth 2 (two) times daily. 90 tablet 3   rosuvastatin (CRESTOR) 20 MG tablet TAKE 1 TABLET BY MOUTH DAILY 90 tablet 1   triamcinolone cream (KENALOG) 0.5 % Apply 1 application. topically 3 (three) times daily as needed (irritation). Use tid prn 60 g 1   zolpidem (AMBIEN) 10 MG tablet Take 1 tablet (10 mg total) by mouth at bedtime. 90 tablet 1   No facility-administered medications prior to visit.    ROS: Review of Systems  Constitutional:  Negative for appetite change, fatigue and unexpected weight change.  HENT:  Positive for congestion. Negative for nosebleeds, sneezing, sore throat and trouble swallowing.   Eyes:  Negative for itching and visual disturbance.  Respiratory:  Negative for cough.   Cardiovascular:  Negative for chest pain, palpitations and leg swelling.  Gastrointestinal:  Negative for abdominal distention, blood in stool, diarrhea and nausea.  Genitourinary:  Negative for frequency and hematuria.   Musculoskeletal:  Negative for back pain, gait problem, joint swelling and neck pain.  Skin:  Negative for rash.  Neurological:  Negative for dizziness, tremors, speech difficulty and weakness.  Psychiatric/Behavioral:  Negative for agitation, dysphoric mood and sleep disturbance. The patient is not nervous/anxious.     Objective:  BP 138/76 (BP Location: Left Arm, Patient Position: Sitting, Cuff Size: Large)   Pulse (!) 58   Temp 97.8 F (36.6 C) (Oral)   Ht '5\' 8"'$  (1.727 m)   Wt 181 lb (82.1 kg)   SpO2 92%   BMI 27.52 kg/m   BP Readings from Last 3 Encounters:  10/01/22 138/76  07/15/22 (!) 158/62  03/28/22 138/70    Wt Readings from Last 3 Encounters:  10/01/22 181 lb (82.1 kg)  07/15/22 177 lb (80.3 kg)  03/28/22 177 lb (80.3 kg)    Physical Exam Constitutional:      General: He is not in acute distress.    Appearance: He is well-developed. He is obese.     Comments: NAD  Eyes:     Conjunctiva/sclera: Conjunctivae normal.     Pupils: Pupils are equal, round, and reactive to light.  Neck:     Thyroid: No thyromegaly.     Vascular: No JVD.  Cardiovascular:     Rate and Rhythm: Normal rate and regular rhythm.     Heart sounds: Normal heart sounds. No murmur heard.    No friction rub. No gallop.  Pulmonary:     Effort: Pulmonary effort is normal. No respiratory distress.     Breath sounds: Normal breath sounds. No wheezing or rales.  Chest:     Chest wall: No tenderness.  Abdominal:     General: Bowel sounds are normal. There is no distension.     Palpations: Abdomen is soft. There is no mass.     Tenderness: There is no abdominal tenderness. There is no guarding or rebound.  Musculoskeletal:        General: No tenderness. Normal range of motion.     Cervical back: Normal range of motion.  Lymphadenopathy:     Cervical: No cervical adenopathy.  Skin:    General: Skin is warm and dry.     Findings: No rash.  Neurological:     Mental Status: He is alert  and oriented to person, place, and time.     Cranial Nerves: No cranial nerve deficit.     Motor: No abnormal muscle tone.     Coordination: Coordination normal.     Gait: Gait normal.     Deep Tendon Reflexes: Reflexes are normal and symmetric.  Psychiatric:        Behavior: Behavior normal.        Thought Content: Thought content normal.        Judgment: Judgment normal.   Lesions on skin  Lab Results  Component Value Date   WBC 4.7 09/30/2022   HGB 14.9 09/30/2022   HCT 44.5 09/30/2022   PLT 109.0 (L) 09/30/2022   GLUCOSE 100 (H) 09/30/2022   CHOL 191 09/30/2022   TRIG 85.0 09/30/2022   HDL 47.00 09/30/2022   LDLDIRECT 296.1 08/12/2013   LDLCALC 127 (H) 09/30/2022   ALT 18 09/30/2022   AST 20 09/30/2022   NA 140 09/30/2022   K 3.7 09/30/2022   CL 103 09/30/2022   CREATININE 1.05 09/30/2022   BUN 15 09/30/2022   CO2 29 09/30/2022   TSH 2.06 09/30/2022   PSA 0.00 (L) 09/30/2022   INR 0.99 07/31/2011   HGBA1C 5.8 03/09/2014    No results found.  Assessment & Plan:   Problem List Items Addressed This Visit     Dyslipidemia    Cont on Vytorin      Relevant Orders   TSH   Lipid panel   Essential hypertension - Primary     On Labetalol 300 mg bid and Avalide      Relevant Orders   TSH   Urinalysis   CBC with Differential/Platelet   Lipid panel   Comprehensive metabolic panel   Insomnia    Chronic Zolpidem prn  Potential benefits of a long term benzodiazepines  use as well as potential risks  and complications were explained to the patient and were aknowledged.      NEOPLASM, SKIN, UNCERTAIN BEHAVIOR    Cryo appt w/me      PROSTATE CANCER, HX OF    Will monitor PSA      Relevant Orders   PSA   Well adult exam   Other Visit Diagnoses     History of prostate cancer       Relevant Orders   PSA         Meds ordered this encounter  Medications   zolpidem (AMBIEN) 10 MG tablet    Sig: Take 1 tablet (10 mg total) by mouth at bedtime.     Dispense:  90 tablet    Refill:  1  Follow-up: Return in about 6 months (around 04/01/2023) for Wellness Exam.  [name], MD

## 2022-10-07 ENCOUNTER — Other Ambulatory Visit: Payer: Self-pay | Admitting: Internal Medicine

## 2022-11-08 ENCOUNTER — Other Ambulatory Visit: Payer: Self-pay | Admitting: Internal Medicine

## 2022-11-14 ENCOUNTER — Telehealth: Payer: Self-pay | Admitting: Internal Medicine

## 2022-11-14 MED ORDER — ROSUVASTATIN CALCIUM 20 MG PO TABS: 20.0000 mg | ORAL_TABLET | Freq: Every day | ORAL | 1 refills | Status: DC

## 2022-11-14 NOTE — Telephone Encounter (Signed)
Sent rx to [facility]../lmb 

## 2022-11-14 NOTE — Telephone Encounter (Signed)
Patient would like his crestor refilled - he states that Dr. Alain Marion had originally prescribed the medication.  Please send to [facility].  Next appt:  04/01/2023  Last:  09/30/2022  Patients #  [phone]

## 2022-11-27 ENCOUNTER — Ambulatory Visit: Payer: Medicare PPO

## 2022-12-10 ENCOUNTER — Telehealth: Payer: Self-pay | Admitting: Internal Medicine

## 2022-12-10 NOTE — Telephone Encounter (Signed)
Patient needs a form signed - he wants to stop by with no appointment   Can you please call patient and let him know if this is acceptable.

## 2022-12-11 NOTE — Telephone Encounter (Signed)
Place white envelope w/ pt letter in MD purple folder.Marland KitchenJohny Joseph

## 2022-12-11 NOTE — Telephone Encounter (Signed)
Called pt to see what type of letter he is needing. Pt states he is actually writing MD a letter of what he need to put on letter. He is appealing for his disability. He states MD wrote letter about 3 years ago. Spoke w/ [employer] too and they state to appeal it. Want to drop note off to MD to write. Inform pt ok to drop note off and once he write letter will give him a call back to pick up.Marland KitchenChryl Heck

## 2022-12-16 ENCOUNTER — Encounter: Payer: Self-pay | Admitting: Internal Medicine

## 2022-12-16 NOTE — Telephone Encounter (Signed)
Done. Thx.

## 2022-12-16 NOTE — Telephone Encounter (Signed)
Notified pt letter ready for pick-up..lmb 

## 2022-12-20 ENCOUNTER — Ambulatory Visit (INDEPENDENT_AMBULATORY_CARE_PROVIDER_SITE_OTHER): Payer: Medicare PPO

## 2022-12-20 VITALS — Ht 68.0 in | Wt 175.0 lb

## 2022-12-20 DIAGNOSIS — Z0000 Encounter for general adult medical examination without abnormal findings: Secondary | ICD-10-CM

## 2022-12-20 NOTE — Progress Notes (Signed)
Virtual Visit via Telephone Note  I connected with  Andrew Joseph. on 12/20/22 at  9:30 AM EST by telephone and verified that I am speaking with the correct person using two identifiers.  Location: Patient: Home Provider: [place] Persons participating in the virtual visit: [place]   I discussed the limitations, risks, security and privacy concerns of performing an evaluation and management service by telephone and the availability of in person appointments. The patient expressed understanding and agreed to proceed.  Interactive audio and video telecommunications were attempted between this nurse and patient, however failed, due to patient having technical difficulties OR patient did not have access to video capability.  We continued and completed visit with audio only.  Some vital signs may be absent or patient reported.   Sheral Flow, LPN  Subjective:   Andrew Joseph. is a 77 y.o. male who presents for Medicare Annual/Subsequent preventive examination.  Review of Systems     Cardiac Risk Factors include: advanced age (>35mn, >>23women);dyslipidemia;hypertension;male gender;family history of premature cardiovascular disease     Objective:    Today's Vitals   12/20/22 0931  Weight: 175 lb (79.4 kg)  Height: '5\' 8"'$  (1.727 m)  PainSc: 0-No pain   Body mass index is 26.61 kg/m.     12/20/2022    9:33 AM 11/21/2021    3:35 PM 04/13/2021    6:36 AM 09/22/2020    1:40 PM 12/08/2014   10:56 AM 08/17/2014   10:09 AM  Advanced Directives  Does Patient Have a Medical Advance Directive? No No No No No No  Would patient like information on creating a medical advance directive? No - Patient declined No - Patient declined No - Patient declined No - Patient declined  No - patient declined information    Current Medications (verified) Outpatient Encounter Medications as of 12/20/2022  Medication Sig   apixaban (ELIQUIS) 5 MG TABS tablet  TAKE 1 TABLET BY MOUTH TWICE DAILY   Cholecalciferol (VITAMIN D3) 2000 UNITS capsule Take 1 capsule (2,000 Units total) by mouth daily.   hydroxychloroquine (PLAQUENIL) 200 MG tablet Take 400 mg bid x 2 days, then 200 mg daily for 5 days, then stop   irbesartan-hydrochlorothiazide (AVALIDE) 150-12.5 MG tablet TAKE 1 TABLET BY MOUTH DAILY   labetalol (NORMODYNE) 300 MG tablet TAKE 1 TABLET BY MOUTH TWICE DAILY   potassium chloride (KLOR-CON M) 10 MEQ tablet Take 1 tablet (10 mEq total) by mouth 2 (two) times daily.   rosuvastatin (CRESTOR) 20 MG tablet Take 1 tablet (20 mg total) by mouth daily.   triamcinolone cream (KENALOG) 0.5 % Apply 1 application. topically 3 (three) times daily as needed (irritation). Use tid prn   zolpidem (AMBIEN) 10 MG tablet Take 1 tablet (10 mg total) by mouth at bedtime.   No facility-administered encounter medications on file as of 12/20/2022.    Allergies (verified) Amlodipine, Iodine, and Shellfish allergy   History: Past Medical History:  Diagnosis Date   Allergic rhinitis, cause unspecified    Allergy    SEASONAL   Anxiety    Blood in stool    BPH (benign prostatic hyperplasia)    CAD (coronary artery disease)    DES mid lad 08/15/2009   Cancer [place]   prostate   Chest pain, unspecified    Colon polyps    CVA (cerebrovascular accident due to intracerebral hemorrhage) ([facility] 2007 [place]   History of prostate cancer 2009   Dr WJeffie Pollock  HTN (hypertension)    Hypercholesterolemia    Hyperlipidemia    Hypokalemia    Insomnia, persistent    Neoplasm of uncertain behavior of skin    Stroke [facility])    2007   Syncope    Tubular adenoma of colon 2010   Past Surgical History:  Procedure Laterality Date   ANGIOPLASTY     with stents   CARDIOVERSION N/A 04/13/2021   Procedure: CARDIOVERSION;  Surgeon: [name], MD;  Location: [facility] ENDOSCOPY;  Service: Cardiovascular;  Laterality: N/A;   COLONOSCOPY  2016   Stark, TA's   POLYPECTOMY      PROSTATECTOMY  2009   TONSILLECTOMY     Family History  Problem Relation Age of Onset   Stroke Mother    Heart disease Father        MI   Diabetes Brother        Obese   Colon polyps Neg Hx    Esophageal cancer Neg Hx    Rectal cancer Neg Hx    Stomach cancer Neg Hx    Colon cancer Neg Hx    Social History   Socioeconomic History   Marital status: Divorced    Spouse name: Not on file   Number of children: 3   Years of education: Not on file   Highest education level: Professional school degree (e.g., MD, DDS, DVM, JD)  Occupational History   Occupation: [employer] [place]    Employer: [employer]    Comment: Semi-retired   Tobacco Use   Smoking status: Never   Smokeless tobacco: Never  Vaping Use   Vaping Use: Never used  Substance and Sexual Activity   Alcohol use: Yes    Comment: glass of wine- occ  per pt - occ mixed drink    Drug use: No   Sexual activity: Not Currently  Other Topics Concern   Not on file  Social History Narrative   Retired [employer].   Divorced   Social Determinants of Health   Financial Resource Strain: Low Risk  (12/20/2022)   Overall Financial Resource Strain (CARDIA)    Difficulty of Paying Living Expenses: Not hard at all  Food Insecurity: No Food Insecurity (12/20/2022)   Hunger Vital Sign    Worried About Running Out of Food in the Last Year: Never true    Ran Out of Food in the Last Year: Never true  Transportation Needs: No Transportation Needs (12/20/2022)   PRAPARE - Hydrologist (Medical): No    Lack of Transportation (Non-Medical): No  Physical Activity: Inactive (12/20/2022)   Exercise Vital Sign    Days of Exercise per Week: 0 days    Minutes of Exercise per Session: 0 min  Stress: No Stress Concern Present (12/20/2022)   [place]    Feeling of Stress : Not at all  Social Connections: Moderately Integrated (12/20/2022)    Social Connection and Isolation Panel [NHANES]    Frequency of Communication with Friends and Family: More than three times a week    Frequency of Social Gatherings with Friends and Family: More than three times a week    Attends Religious Services: More than 4 times per year    Active Member of [employer] or Organizations: Yes    Attends Music therapist: More than 4 times per year    Marital Status: Divorced    Tobacco Counseling Counseling given: Not  Answered   Clinical Intake:  Pre-visit preparation completed: Yes  Pain : No/denies pain Pain Score: 0-No pain     BMI - recorded: 26.61 Nutritional Status: BMI 25 -29 Overweight Nutritional Risks: None Diabetes: No  How often do you need to have someone help you when you read instructions, pamphlets, or other written materials from your doctor or pharmacy?: 1 - Never What is the last grade level you completed in school?: Completed Law School  Diabetic? No  Interpreter Needed?: No  Information entered by :: Lisette Abu, LPN.   Activities of Daily Living    12/20/2022    9:38 AM  In your present state of health, do you have any difficulty performing the following activities:  Hearing? 0  Vision? 0  Difficulty concentrating or making decisions? 0  Walking or climbing stairs? 0  Dressing or bathing? 0  Doing errands, shopping? 0  Preparing Food and eating ? N  Using the Toilet? N  In the past six months, have you accidently leaked urine? N  Do you have problems with loss of bowel control? N  Managing your Medications? N  Managing your Finances? N  Housekeeping or managing your Housekeeping? N    Patient Care Team: [name], [name], MD as PCP - General [name], MD as PCP - Cardiology (Cardiology) Josue Hector, MD as Consulting Physician (Cardiology) Rosana Berger, MD as Referring Physician (Gastroenterology)  Indicate any recent Medical Services you may have received  from other than [facility] providers in the past year (date may be approximate).     Assessment:   This is a routine wellness examination for [employer].  Hearing/Vision screen Hearing Screening - Comments:: Denies hearing difficulties.  Vision Screening - Comments:: No glasses - not up to date with routine eye exams.   Dietary issues and exercise activities discussed: Current Exercise Habits: The patient does not participate in regular exercise at present (No regular exercise, but is very active around the home and going out.), Exercise limited by: neurologic condition(s);respiratory conditions(s);cardiac condition(s)   Goals Addressed             This Visit's Progress    Client understands the importance of follow-up with providers by attending scheduled visits.        Depression Screen    12/20/2022    9:38 AM 10/01/2022   10:03 AM 03/28/2022   10:55 AM 11/21/2021    3:17 PM 09/22/2020    1:39 PM 10/21/2019    4:34 PM 08/31/2018    4:03 PM  PHQ 2/9 Scores  PHQ - 2 Score 0 0 0 0 0 0 0  PHQ- 9 Score  0 0        Fall Risk    12/20/2022    9:34 AM 10/01/2022   10:03 AM 03/28/2022   10:56 AM 11/21/2021    3:14 PM 09/22/2020    1:40 PM  [place] in the past year? 0 0 0 0 0  Number falls in past yr: 0  0 0 0  Injury with Fall? 0  0 0 0  Risk for fall due to : No Fall Risks No Fall Risks No Fall Risks No Fall Risks No Fall Risks  Follow up Falls prevention discussed Falls evaluation completed  Falls prevention discussed Falls evaluation completed    FALL RISK PREVENTION PERTAINING TO THE HOME:  Any stairs in or around the home? Yes  If so, are there any without handrails? No  Home free of loose throw rugs in walkways, pet beds, electrical cords, etc? Yes  Adequate lighting in your home to reduce risk of falls? Yes   ASSISTIVE DEVICES UTILIZED TO PREVENT FALLS:  Life alert? No  Use of a cane, walker or w/c? No  Grab bars in the bathroom? Yes  Shower chair or  bench in shower? Yes  Elevated toilet seat or a handicapped toilet? Yes   TIMED UP AND GO:  Was the test performed? No . Phone Visit  Cognitive Function:        12/20/2022    9:40 AM  6CIT Screen  What Year? 0 points  What month? 0 points  What time? 0 points  Count back from 20 0 points  Months in reverse 0 points  Repeat phrase 0 points  Total Score 0 points    Immunizations Immunization History  Administered Date(s) Administered   Tdap 03/21/2013, 04/19/2019    TDAP status: Up to date  Flu Vaccine status: Declined, Education has been provided regarding the importance of this vaccine but patient still declined. Advised may receive this vaccine at local pharmacy or Health Dept. Aware to provide a copy of the vaccination record if obtained from local pharmacy or Health Dept. Verbalized acceptance and understanding.  Pneumococcal vaccine status: Declined,  Education has been provided regarding the importance of this vaccine but patient still declined. Advised may receive this vaccine at local pharmacy or Health Dept. Aware to provide a copy of the vaccination record if obtained from local pharmacy or Health Dept. Verbalized acceptance and understanding.   Covid-19 vaccine status: Declined, Education has been provided regarding the importance of this vaccine but patient still declined. Advised may receive this vaccine at local pharmacy or Health Dept.or vaccine clinic. Aware to provide a copy of the vaccination record if obtained from local pharmacy or Health Dept. Verbalized acceptance and understanding.  Qualifies for Shingles Vaccine? Yes   Zostavax completed No   Shingrix Completed?: No.    Education has been provided regarding the importance of this vaccine. Patient has been advised to call insurance company to determine out of pocket expense if they have not yet received this vaccine. Advised may also receive vaccine at local pharmacy or Health Dept. Verbalized acceptance  and understanding.  Screening Tests Health Maintenance  Topic Date Due   Zoster Vaccines- Shingrix (1 of 2) 01/01/2023 (Originally 04/20/1965)   COVID-19 Vaccine (1) 05/03/2023 (Originally 04/20/1951)   Pneumonia Vaccine 44+ Years old (1 - PCV) 10/02/2023 (Originally 04/20/2010)   Hepatitis C Screening  10/02/2023 (Originally 04/21/1964)   Medicare Annual Wellness (AWV)  12/21/2023   DTaP/Tdap/Td (3 - Td or Tdap) 04/18/2029   HPV VACCINES  Aged Out   INFLUENZA VACCINE  Discontinued   COLONOSCOPY (Pts 45-6yr Insurance coverage will need to be confirmed)  Discontinued    Health Maintenance  There are no preventive care reminders to display for this patient.   Colorectal cancer screening: No longer required.   Lung Cancer Screening: (Low Dose CT Chest recommended if Age 78-80years, 30 pack-year currently smoking OR have quit w/in 15years.) does not qualify.   Lung Cancer Screening Referral: No  Additional Screening:  Hepatitis C Screening: does qualify; Completed: No  Vision Screening: Recommended annual ophthalmology exams for early detection of glaucoma and other disorders of the eye. Is the patient up to date with their annual eye exam?  No  Who is the provider or what is the name of the office in which  the patient attends annual eye exams? Declined If pt is not established with a provider, would they like to be referred to a provider to establish care? No .   Dental Screening: Recommended annual dental exams for proper oral hygiene  Community Resource Referral / Chronic Care Management: CRR required this visit?  No   CCM required this visit?  No      Plan:     I have personally reviewed and noted the following in the patient's chart:   Medical and social history Use of alcohol, tobacco or illicit drugs  Current medications and supplements including opioid prescriptions. Patient is not currently taking opioid prescriptions. Functional ability and  status Nutritional status Physical activity Advanced directives List of other physicians Hospitalizations, surgeries, and ER visits in previous 12 months Vitals Screenings to include cognitive, depression, and falls Referrals and appointments  In addition, I have reviewed and discussed with patient certain preventive protocols, quality metrics, and best practice recommendations. A written personalized care plan for preventive services as well as general preventive health recommendations were provided to patient.     Sheral Flow, LPN   [id number]   Nurse Notes: N/A

## 2022-12-20 NOTE — Patient Instructions (Signed)
Mr. Andrew Joseph , Thank you for taking time to come for your Medicare Wellness Visit. I appreciate your ongoing commitment to your health goals. Please review the following plan we discussed and let me know if I can assist you in the future.   These are the goals we discussed:  Goals       Client understands the importance of follow-up with providers by attending scheduled visits. (pt-stated)      Patient stated: To stay active.        This is a list of the screening recommended for you and due dates:  Health Maintenance  Topic Date Due   Zoster (Shingles) Vaccine (1 of 2) 01/01/2023*   COVID-19 Vaccine (1) 05/03/2023*   Pneumonia Vaccine (1 - PCV) 10/02/2023*   Hepatitis C Screening: USPSTF Recommendation to screen - Ages 18-79 yo.  10/02/2023*   Medicare Annual Wellness Visit  12/21/2023   DTaP/Tdap/Td vaccine (3 - Td or Tdap) 04/18/2029   HPV Vaccine  Aged Out   Flu Shot  Discontinued   Colon Cancer Screening  Discontinued  *Topic was postponed. The date shown is not the original due date.    Advanced directives: Yes; Son is aware of wishes.  Conditions/risks identified: Yes  Next appointment: Follow up in one year for your annual wellness visit.   Preventive Care 38 Years and Older, Male  Preventive care refers to lifestyle choices and visits with your health care provider that can promote health and wellness. What does preventive care include? A yearly physical exam. This is also called an annual well check. Dental exams once or twice a year. Routine eye exams. Ask your health care provider how often you should have your eyes checked. Personal lifestyle choices, including: Daily care of your teeth and gums. Regular physical activity. Eating a healthy diet. Avoiding tobacco and drug use. Limiting alcohol use. Practicing safe sex. Taking low doses of aspirin every day. Taking vitamin and mineral supplements as recommended by your health care provider. What happens  during an annual well check? The services and screenings done by your health care provider during your annual well check will depend on your age, overall health, lifestyle risk factors, and family history of disease. Counseling  Your health care provider may ask you questions about your: Alcohol use. Tobacco use. Drug use. Emotional well-being. Home and relationship well-being. Sexual activity. Eating habits. History of falls. Memory and ability to understand (cognition). Work and work Statistician. Screening  You may have the following tests or measurements: Height, weight, and BMI. Blood pressure. Lipid and cholesterol levels. These may be checked every 5 years, or more frequently if you are over 47 years old. Skin check. Lung cancer screening. You may have this screening every year starting at age 82 if you have a 30-pack-year history of smoking and currently smoke or have quit within the past 15 years. Fecal occult blood test (FOBT) of the stool. You may have this test every year starting at age 71. Flexible sigmoidoscopy or colonoscopy. You may have a sigmoidoscopy every 5 years or a colonoscopy every 10 years starting at age 65. Prostate cancer screening. Recommendations will vary depending on your family history and other risks. Hepatitis C blood test. Hepatitis B blood test. Sexually transmitted disease (STD) testing. Diabetes screening. This is done by checking your blood sugar (glucose) after you have not eaten for a while (fasting). You may have this done every 1-3 years. Abdominal aortic aneurysm (AAA) screening. You may need this if  you are a current or former smoker. Osteoporosis. You may be screened starting at age 63 if you are at high risk. Talk with your health care provider about your test results, treatment options, and if necessary, the need for more tests. Vaccines  Your health care provider may recommend certain vaccines, such as: Influenza vaccine. This is  recommended every year. Tetanus, diphtheria, and acellular pertussis (Tdap, Td) vaccine. You may need a Td booster every 10 years. Zoster vaccine. You may need this after age 74. Pneumococcal 13-valent conjugate (PCV13) vaccine. One dose is recommended after age 8. Pneumococcal polysaccharide (PPSV23) vaccine. One dose is recommended after age 61. Talk to your health care provider about which screenings and vaccines you need and how often you need them. This information is not intended to replace advice given to you by your health care provider. Make sure you discuss any questions you have with your health care provider. Document Released: 12/15/2015 Document Revised: 08/07/2016 Document Reviewed: 09/19/2015 Elsevier Interactive Patient Education  2017 [place] Prevention in the Home Falls can cause injuries. They can happen to people of all ages. There are many things you can do to make your home safe and to help prevent falls. What can I do on the outside of my home? Regularly fix the edges of walkways and driveways and fix any cracks. Remove anything that might make you trip as you walk through a door, such as a raised step or threshold. Trim any bushes or trees on the path to your home. Use bright outdoor lighting. Clear any walking paths of anything that might make someone trip, such as rocks or tools. Regularly check to see if handrails are loose or broken. Make sure that both sides of any steps have handrails. Any raised decks and porches should have guardrails on the edges. Have any leaves, snow, or ice cleared regularly. Use sand or salt on walking paths during winter. Clean up any spills in your garage right away. This includes oil or grease spills. What can I do in the bathroom? Use night lights. Install grab bars by the toilet and in the tub and shower. Do not use towel bars as grab bars. Use non-skid mats or decals in the tub or shower. If you need to sit down in  the shower, use a plastic, non-slip stool. Keep the floor dry. Clean up any water that spills on the floor as soon as it happens. Remove soap buildup in the tub or shower regularly. Attach bath mats securely with double-sided non-slip rug tape. Do not have throw rugs and other things on the floor that can make you trip. What can I do in the bedroom? Use night lights. Make sure that you have a light by your bed that is easy to reach. Do not use any sheets or blankets that are too big for your bed. They should not hang down onto the floor. Have a firm chair that has side arms. You can use this for support while you get dressed. Do not have throw rugs and other things on the floor that can make you trip. What can I do in the kitchen? Clean up any spills right away. Avoid walking on wet floors. Keep items that you use a lot in easy-to-reach places. If you need to reach something above you, use a strong step stool that has a grab bar. Keep electrical cords out of the way. Do not use floor polish or wax that makes floors slippery. If  you must use wax, use non-skid floor wax. Do not have throw rugs and other things on the floor that can make you trip. What can I do with my stairs? Do not leave any items on the stairs. Make sure that there are handrails on both sides of the stairs and use them. Fix handrails that are broken or loose. Make sure that handrails are as long as the stairways. Check any carpeting to make sure that it is firmly attached to the stairs. Fix any carpet that is loose or worn. Avoid having throw rugs at the top or bottom of the stairs. If you do have throw rugs, attach them to the floor with carpet tape. Make sure that you have a light switch at the top of the stairs and the bottom of the stairs. If you do not have them, ask someone to add them for you. What else can I do to help prevent falls? Wear shoes that: Do not have high heels. Have rubber bottoms. Are comfortable  and fit you well. Are closed at the toe. Do not wear sandals. If you use a stepladder: Make sure that it is fully opened. Do not climb a closed stepladder. Make sure that both sides of the stepladder are locked into place. Ask someone to hold it for you, if possible. Clearly mark and make sure that you can see: Any grab bars or handrails. First and last steps. Where the edge of each step is. Use tools that help you move around (mobility aids) if they are needed. These include: Canes. Walkers. Scooters. Crutches. Turn on the lights when you go into a dark area. Replace any light bulbs as soon as they burn out. Set up your furniture so you have a clear path. Avoid moving your furniture around. If any of your floors are uneven, fix them. If there are any pets around you, be aware of where they are. Review your medicines with your doctor. Some medicines can make you feel dizzy. This can increase your chance of falling. Ask your doctor what other things that you can do to help prevent falls. This information is not intended to replace advice given to you by your health care provider. Make sure you discuss any questions you have with your health care provider. Document Released: 09/14/2009 Document Revised: 04/25/2016 Document Reviewed: 12/23/2014 Elsevier Interactive Patient Education  2017 [employer].

## 2022-12-24 ENCOUNTER — Telehealth: Payer: Self-pay | Admitting: Cardiology

## 2022-12-24 NOTE — Telephone Encounter (Signed)
Returned call to patient,   Patient states that he wore an apple watch last night and it alerted him to being in A. Fib with a fast heart rate. He says that her averages 60-70, but the watch told him he was ranging from about 90-100s. He said he has been feeling some strange feelings for a while, but wasn't sure how to interpret it. He is on Eliquis and has not missed any doses. He denies chest pain, but he states that when he walks up the stairs it can take him a few extra breaths to feel comfortable again. He cannot always take a full breath. Patient states I do not feel like I am having a medical emergency. Reviewed symptoms to be concerned about. Offered sooner appointment with NP, patient politely declines, able to get patient scheduled sooner with Dr. Harrell Gave on 2/13. Advised patient that should symptoms change before upcoming appointment he should give [place] a call back!

## 2022-12-24 NOTE — Telephone Encounter (Signed)
Patient c/o Palpitations:  High priority if patient c/o lightheadedness, shortness of breath, or chest pain  How long have you had palpitations/irregular HR/ Afib? Are you having the symptoms now?  Pt states his friend put a watch on his wrist recently and it read that he was in afib   Are you currently experiencing lightheadedness, SOB or CP? Not at the moment, pt stated "It probably is SOB, its not major, but I cannot take a full breath"   Do you have a history of afib (atrial fibrillation) or irregular heart rhythm? Yes   Have you checked your BP or HR? (document readings if available):  Hr is usually around the 60's and last night it was in the 80's and 90's   Are you experiencing any other symptoms? No  Pt made called in today to sch an appt due to some afib issues.. Pt has been scheduled for next available and added to the waitlist. Pt was opposed to seeing APP

## 2022-12-25 ENCOUNTER — Ambulatory Visit: Payer: Medicare PPO | Admitting: Internal Medicine

## 2023-01-03 ENCOUNTER — Encounter (HOSPITAL_BASED_OUTPATIENT_CLINIC_OR_DEPARTMENT_OTHER): Payer: Self-pay | Admitting: Cardiology

## 2023-01-03 ENCOUNTER — Ambulatory Visit (HOSPITAL_BASED_OUTPATIENT_CLINIC_OR_DEPARTMENT_OTHER): Payer: Medicare PPO | Admitting: Cardiology

## 2023-01-03 VITALS — BP 178/110 | HR 98 | Ht 68.0 in | Wt 183.0 lb

## 2023-01-03 DIAGNOSIS — I10 Essential (primary) hypertension: Secondary | ICD-10-CM

## 2023-01-03 DIAGNOSIS — I2510 Atherosclerotic heart disease of native coronary artery without angina pectoris: Secondary | ICD-10-CM

## 2023-01-03 DIAGNOSIS — I484 Atypical atrial flutter: Secondary | ICD-10-CM

## 2023-01-03 DIAGNOSIS — Z955 Presence of coronary angioplasty implant and graft: Secondary | ICD-10-CM | POA: Diagnosis not present

## 2023-01-03 MED ORDER — CARVEDILOL 12.5 MG PO TABS: 12.5000 mg | ORAL_TABLET | Freq: Two times a day (BID) | ORAL | 3 refills | Status: DC

## 2023-01-03 NOTE — Progress Notes (Signed)
Cardiology Office Note:    Date:  01/03/2023   ID:  Andrew Joseph., DOB [birth year], MRN [id number]  PCP:  [name], MD  Cardiologist:  [name], MD  Referring MD: [name], MD   CC: follow up  History of Present Illness:    Andrew Joseph. is a 77 y.o. male with a hx of CAD with remote stent, paroxysmal atrial flutter s/p cardioversion who is seen for follow up. I initially met him 09/07/2019 as a new consult at the request of [name], [name], MD for the evaluation and management of CAD.  CV history: distal LAD stent, last cath 03-01-11 Father died of MI age 50. Hypercholesterolemia: prior LDL 124. Was told in the past his total cholesterol was over 400. Was happy that his total cholesterol is less than 200.  Cardiovascular risk factors: Prior clinical ASCVD: CAD with PCI to LAD. ?CVA in 2006-02-28 (quarter sized bleed in his brain--lightheadedness/dizziness), unclear ?seizure event 2011/03/01 Comorbid conditions: hypertension, hyperlipidemia Prior cardiac testing: cath 2010, 2012. Echo Feb 28, 2014. MPI 1997, 200003/30/0230-Mar-2003  At his last visit, he was feeling stable. Blood pressure in clinic was 158/62. He had taken his morning dose of Labetalol and Eliquis. He usually took irbesartan-HCTZ at night. We discussed that taking this at night may exacerbate nocturia, but he felt this was manageable and current regimen was working well. Reviewed lipids from 03/27/22 per [facility]. LDL 136, Tchol 199. We have discussed options for more intense management. Given his LDL on 20 mg rosuvastatin, suspect he will need PCSK9i to get to goal. He was happy with his current regimen and declined changes.   On 12/24/22 he called the office noting he wore an apple watch the night before which alerted him to being in atrial fibrillation with HR 90s-100s. He had been feeling strange and reported some dyspnea at times. He did not feel like he was having a medical emergency.   Today, he  states he has been in Afib intermittently for several months. He typically has not been very limited by his Afib episodes. However, lately he is concerned that this seems to be the case. Until recently he never felt like he was short of breath or out of stamina. He has noticed having more difficulty breathing when climbing stairs. In the past week he had more labored breathing, even with lying down at night. This week it is less labored, but still atypical for him. As he sits on the exam table now, he states he is not able to take a full breath every time. A few years ago he was able to walk 5 miles on a track. Last week, he was not able to walk more than 1 mile. He did push himself to complete 30 minutes of walking. No anginal symptoms, dizziness, or presyncope. No loss of consciousness.  In clinic today his blood pressure is elevated at 178/110. He does not believe the 300 mg labetalol is working for him.  He denies any palpitations, chest pain, or peripheral edema. No headaches, orthopnea, or PND.   Past Medical History:  Diagnosis Date   Allergic rhinitis, cause unspecified    Allergy    SEASONAL   Anxiety    Blood in stool    BPH (benign prostatic hyperplasia)    CAD (coronary artery disease)    DES mid lad 08/15/2009   Cancer ([place]) Feb 29, 2008   prostate   Chest pain, unspecified    Colon polyps  CVA (cerebrovascular accident due to intracerebral hemorrhage) [facility]) 2007 Spring   History of prostate cancer 2009   Dr [name]   HTN (hypertension)    Hypercholesterolemia    Hyperlipidemia    Hypokalemia    Insomnia, persistent    Neoplasm of uncertain behavior of skin    Stroke [facility])    2007   Syncope    Tubular adenoma of colon 2010    Past Surgical History:  Procedure Laterality Date   ANGIOPLASTY     with stents   CARDIOVERSION N/A 04/13/2021   Procedure: CARDIOVERSION;  Surgeon: [name], MD;  Location: [facility] ENDOSCOPY;  Service: Cardiovascular;  Laterality: N/A;    COLONOSCOPY  2016   Stark, TA's   POLYPECTOMY     PROSTATECTOMY  2009   TONSILLECTOMY      Current Medications: Current Outpatient Medications on File Prior to Visit  Medication Sig   apixaban (ELIQUIS) 5 MG TABS tablet TAKE 1 TABLET BY MOUTH TWICE DAILY   Cholecalciferol (VITAMIN D3) 2000 UNITS capsule Take 1 capsule (2,000 Units total) by mouth daily.   hydroxychloroquine (PLAQUENIL) 200 MG tablet Take 400 mg bid x 2 days, then 200 mg daily for 5 days, then stop   irbesartan-hydrochlorothiazide (AVALIDE) 150-12.5 MG tablet TAKE 1 TABLET BY MOUTH DAILY   potassium chloride (KLOR-CON M) 10 MEQ tablet Take 1 tablet (10 mEq total) by mouth 2 (two) times daily.   rosuvastatin (CRESTOR) 20 MG tablet Take 1 tablet (20 mg total) by mouth daily.   triamcinolone cream (KENALOG) 0.5 % Apply 1 application. topically 3 (three) times daily as needed (irritation). Use tid prn   zolpidem (AMBIEN) 10 MG tablet Take 1 tablet (10 mg total) by mouth at bedtime.   No current facility-administered medications on file prior to visit.     Allergies:   Amlodipine, Iodine, and Shellfish allergy   Social History   Tobacco Use   Smoking status: Never   Smokeless tobacco: Never  Vaping Use   Vaping Use: Never used  Substance Use Topics   Alcohol use: Yes    Comment: glass of wine- occ  per pt - occ mixed drink    Drug use: No    Family History: family history includes Diabetes in his brother; Heart disease in his father; Stroke in his mother. There is no history of Colon polyps, Esophageal cancer, Rectal cancer, Stomach cancer, or Colon cancer. father died age 47 from MI, 3 ppd smoker, overweight. Mother died age 107 of stroke. Brother has diabetes. 3 children, all heathly  ROS:   Please see the history of present illness.   (+) Fatigue/Malaise (+) Shortness of breath Additional pertinent ROS otherwise unremarkable.   EKGs/Labs/Other Studies Reviewed:    The following studies were reviewed  today:  Echo 04/11/2021: 1. Left ventricular ejection fraction, by estimation, is 45 to 50%. The  left ventricle has mildly decreased function. The left ventricle  demonstrates global hypokinesis. Left ventricular diastolic parameters  were normal.   2. Right ventricular systolic function is normal. The right ventricular  size is mildly enlarged. There is normal pulmonary artery systolic  pressure.   3. Left atrial size was severely dilated.   4. Right atrial size was moderately dilated.   5. The mitral valve is degenerative. No evidence of mitral valve  regurgitation. No evidence of mitral stenosis.   6. The aortic valve is calcified. There is mild calcification of the  aortic valve. There is mild thickening of the  aortic valve. Aortic valve  regurgitation is mild. Mild to moderate aortic valve  sclerosis/calcification is present, without any evidence  of aortic stenosis. Aortic regurgitation PHT measures 351 msec.   7. The inferior vena cava is normal in size with greater than 50%  respiratory variability, suggesting right atrial pressure of 3 mmHg.   US Venous Right LE 06/16/2020: IMPRESSION: No evidence of DVT within the right lower extremity.  Cath 07/31/2011 Left main coronary artery was a very short segment.  There was near side-by-side separate ostia.  No significant disease.  The proximal LAD had 30% to 40% tubular disease.  The mid LAD had 50% multiple discrete lesions.  This was unchanged from the previous catheterization done in 2010.  There was a widely patent stent in the distal LAD.  The LAD was a large artery that wrapped the apex.   The LAD actually supplied most of the PDA territory, so it was difficult to know what dominance the patient was.  Right coronary artery was small and had no significant disease.   The circumflex coronary artery was somewhat larger than right coronary artery, but again the PDA was mostly supplied by the distal LAD.  There was 20% to 30%  ostial lesions proximally.  The ostium of the first obtuse marginal branch had a 40% lesion.   The patient also had a very large diagonal branch, which did not have significant disease and provided most of the anterolateral wall.   Left ventriculography.  Left ventriculography was normal.  EF was 60%. There was no gradient across the aortic valve and no MR.  Aortic pressure was 132/71.  LV pressure was somewhat inaccurate with the LV tracing at 160/11.   IMPRESSION:  The patient has continued moderate disease in the mid LAD after the diagonal takeoff.  I closely reviewed his previous film from 2010 and there has been no change in this disease.  His stent is widely patent and he does not have any new disease in the right coronary artery or circ.  Clinically when I talked to the patient it sounded like he had a seizure.  There is no antecedent chest pain, palpitations, or cardiac symptoms.   The patient was fairly insistent on having a diagnostic cath rather than a stress test.   I have encouraged the patient to follow up with Neurology.  We have already arranged for him to have an EEG, MRI with MRA, and neurological followup.   Given the patency of the stent, normal LV function would be highly unlikely that his "seizure event was cardiac in etiology."  At the end of the case, a TR band was placed with good hemostasis.  He tolerated the procedure well.  Cath done by Dr. Olevia Perches. During the procedure, he had a contrast reaction to the dye (reported prior iodine/shellfish allergy), was treated for this. Received PCI to LAD with stenosis form 95% to 0%.  EKG:  EKG is personally reviewed.   01/03/2023:  atypical atrial flutter/afib 07/15/2022:  sinus bradycardia at 53 bpm, LAFB 10/08/2021: EKG was not ordered. 05/11/2021: sinus bradycardia, 54 bpm 09/22/20: sinus bradycardia, LAFB at 58 bpm  Recent Labs: 09/30/2022: ALT 18; BUN 15; Creatinine, Ser 1.05; Hemoglobin 14.9; Platelets 109.0;  Potassium 3.7; Sodium 140; TSH 2.06   Recent Lipid Panel    Component Value Date/Time   CHOL 191 09/30/2022 1015   TRIG 85.0 09/30/2022 1015   HDL 47.00 09/30/2022 1015   CHOLHDL 4 09/30/2022 1015   VLDL 17.0  09/30/2022 1015   LDLCALC 127 (H) 09/30/2022 1015   LDLDIRECT 296.1 08/12/2013 0936    Physical Exam:    VS:  BP (!) 178/110   Pulse 98   Ht 5' 8"$  (1.727 m)   Wt 183 lb (83 kg)   BMI 27.83 kg/m   Recheck 170/78  Wt Readings from Last 3 Encounters:  01/03/23 183 lb (83 kg)  12/20/22 175 lb (79.4 kg)  10/01/22 181 lb (82.1 kg)    GEN: Well nourished, well developed in no acute distress HEENT: Normal, moist mucous membranes NECK: No JVD CARDIAC: Irregularly irregular, normal S1 and S2, no rubs or gallops. No murmur. VASCULAR: Radial and DP pulses 2+ bilaterally. No carotid bruits RESPIRATORY:  Clear to auscultation without rales, wheezing or rhonchi  ABDOMEN: Soft, non-tender, non-distended MUSCULOSKELETAL:  Ambulates independently SKIN: Warm and dry, no edema NEUROLOGIC:  Alert and oriented x 3. No focal neuro deficits noted. PSYCHIATRIC:  Normal affect     ASSESSMENT:    1. Atypical atrial flutter (HCC)     PLAN:    Atrial flutter: recurrence -CHA2DS2/VAS Stroke Risk Points= 4  -no bleeding issues, continue apixaban -changing labetalol to carvedilol -discussed cardioversion. He is amenable. Labs week of 2/12, ordered  Shared Decision Making/Informed Consent The risks (stroke, cardiac arrhythmias rarely resulting in the need for a temporary or permanent pacemaker, skin irritation or burns and complications associated with conscious sedation including aspiration, arrhythmia, respiratory failure and death), benefits (restoration of normal sinus rhythm) and alternatives of a direct current cardioversion were explained in detail to Mr. Grima and he agrees to proceed.    CAD with history of prior stent: asymptomatic.  -stopped aspirin with start of  apixaban -continue rosuvastatin 20 mg daily -LDL goal <70, see below re: patient preference -reviewed red flag warning signs that need immediate medical attention  Hypertension: Overall goal <130/80 -above goal today, even on recheck. -continue irbesartan-HCTZ 150-12.5 mg daily. We discussed that taking this at night may exacerbate nocturia, but he feels this is manageable and current regimen is working well -changing labetalol to carvedilol, given instructions on home titration -has been on amlodipine in the past if additional agent needed in the future. Alternative would be to increase irbesartan-HCTZ dose to maximum dosing.  -overall, his preference is to not make any changes to his regimen at this time.   Hypercholesterolemia: LDL goal <70 -reviewed lipids from 03/27/22 per [facility]. LDL 136, Tchol 199.  -we have discussed options for more intense management. Given his LDL on 20 mg rosuvastatin, suspect he will need PCSK9i to get to goal. He is happy with his current regimen and declines changes. Continue to address. -continue rosuvastatin 20 mg daily per patient preference  Cardiac risk counseling and prevention recommendations: -recommend heart healthy/Mediterranean diet, with whole grains, fruits, vegetable, fish, lean meats, nuts, and olive oil. Limit salt. -recommend moderate walking, 3-5 times/week for 30-50 minutes each session. Aim for at least 150 minutes.week. Goal should be pace of 3 miles/hours, or walking 1.5 miles in 30 minutes -recommend avoidance of tobacco products. Avoid excess alcohol.  Plan for follow up: Scheduled cardioversion with Dr Johney Frame 01/21/23. Follow-up with me two weeks following cardioversion, or sooner as needed.  [name], MD, PhD, [facility]   Medication Adjustments/Labs and Tests Ordered: Current medicines are reviewed at length with the patient today.  Concerns regarding medicines are outlined above.   Orders  Placed This Encounter  Procedures   Basic metabolic panel  CBC   EKG 12-Lead   Meds ordered this encounter  Medications   carvedilol (COREG) 12.5 MG tablet    Sig: Take 1 tablet (12.5 mg total) by mouth 2 (two) times daily.    Dispense:  60 tablet    Refill:  3    New dose, if patient tolerates we will send 90 day supplies   Patient Instructions  Medication Instructions:  Your physician has recommended you make the following change in your medication:   We are going to start carvedilol 12.5 mg twice a day. This replaces the labetalol. If after a few days your blood pressure and heart rate are still elevated, contact me and we will increase the carvedilol to 25 mg twice daily.   *If you need a refill on your cardiac medications before your next appointment, please call your pharmacy*   Lab Work: Please return for Lab work the week of 2/12 for BMP and CBC . You may come to the...   [facility] (3rd floor) [address] [place], [place] [address]  Open: 8am-Noon and 1pm-4:30pm  Please ring the doorbell on the small table when you exit the elevator and the Lab Tech will come get you  [place] at [facility] [address] [place], [place], [place] [address] Open: 8am-1pm, then 2pm-4:30pm   [place]- Please see attached locations sheet stapled to your lab work with address and hours.   If you have labs (blood work) drawn today and your tests are completely normal, you will receive your results only by: [place] (if you have MyChart) OR A paper copy in the mail If you have any lab test that is abnormal or we need to change your treatment, we will call you to review the results.  Follow-Up: At [facility], you and your health needs are our priority.  As part of our continuing mission to provide you with exceptional heart care, we have created designated Provider Care Teams.  These Care Teams include your primary  Cardiologist (physician) and Advanced Practice Providers (APPs -  Physician Assistants and Nurse Practitioners) who all work together to provide you with the care you need, when you need it.  We recommend signing up for the patient portal called "MyChart".  Sign up information is provided on this After Visit Summary.  MyChart is used to connect with patients for Virtual Visits (Telemedicine).  Patients are able to view lab/test results, encounter notes, upcoming appointments, etc.  Non-urgent messages can be sent to your provider as well.   To learn more about what you can do with MyChart, go to [email or web address].    Your next appointment:     Other Instructions You are scheduled for a Cardioversion on Tuesday, February 20 with Dr. Johney Frame.  Please arrive at the [facility] (Main Entrance A) at [facility]: [address] [place],  [address] at 9:30 AM.   DIET:  Nothing to eat or drink after midnight except a sip of water with medications (see medication instructions below)  MEDICATION INSTRUCTIONS: Continue taking your anticoagulant (blood thinner): Apixaban (Eliquis).  You will need to continue this after your procedure until you are told by your provider that it is safe to stop.    LABS:  BMP, CBC week of 2/12   FYI:  For your safety, and to allow [place] to monitor your vital signs accurately during the surgery/procedure we request: If you have artificial nails, gel coating, SNS etc, please  have those removed prior to your surgery/procedure. Not having the nail coverings /polish removed may result in cancellation or delay of your surgery/procedure.  You must have a responsible person to drive you home and stay in the waiting area during your procedure. Failure to do so could result in cancellation.  Bring your insurance cards.  *Special Note: Every effort is made to have your procedure done on time. Occasionally there are emergencies that occur at the hospital that  may cause delays. Please be patient if a delay does occur.      I,Mathew Stumpf,acting as a Education administrator for [employer], MD.,have documented all relevant documentation on the behalf of [name], MD,as directed by  [name], MD while in the presence of [name], MD.  I, [name], MD, have reviewed all documentation for this visit. The documentation on 01/03/23 for the exam, diagnosis, procedures, and orders are all accurate and complete.   Signed, [name], MD PhD 01/03/2023    [place]

## 2023-01-03 NOTE — H&P (View-Only) (Signed)
Cardiology Office Note:    Date:  01/03/2023   ID:  Andrew Joseph., DOB 1946-09-28, MRN [id number]  PCP:  [name], MD  Cardiologist:  [name], MD  Referring MD: [name], MD   CC: follow up  History of Present Illness:    Andrew Joseph. is a 77 y.o. male with a hx of CAD with remote stent, paroxysmal atrial flutter s/p cardioversion who is seen for follow up. I initially met him 09/07/2019 as a new consult at the request of [name], [name], MD for the evaluation and management of CAD.  CV history: distal LAD stent, last cath 2011/02/28 Father died of MI age 78. Hypercholesterolemia: prior LDL 124. Was told in the past his total cholesterol was over 400. Was happy that his total cholesterol is less than 200.  Cardiovascular risk factors: Prior clinical ASCVD: CAD with PCI to LAD. ?CVA in 02/27/2006 (quarter sized bleed in his brain--lightheadedness/dizziness), unclear ?seizure event February 28, 2011 Comorbid conditions: hypertension, hyperlipidemia Prior cardiac testing: cath 2010, 2012. Echo 27-Feb-2014. MPI 1997, [id number]  At his last visit, he was feeling stable. Blood pressure in clinic was 158/62. He had taken his morning dose of Labetalol and Eliquis. He usually took irbesartan-HCTZ at night. We discussed that taking this at night may exacerbate nocturia, but he felt this was manageable and current regimen was working well. Reviewed lipids from 03/27/22 per [facility]. LDL 136, Tchol 199. We have discussed options for more intense management. Given his LDL on 20 mg rosuvastatin, suspect he will need PCSK9i to get to goal. He was happy with his current regimen and declined changes.   On 12/24/22 he called the office noting he wore an apple watch the night before which alerted him to being in atrial fibrillation with HR 90s-100s. He had been feeling strange and reported some dyspnea at times. He did not feel like he was having a medical emergency.   Today, he  states he has been in Afib intermittently for several months. He typically has not been very limited by his Afib episodes. However, lately he is concerned that this seems to be the case. Until recently he never felt like he was short of breath or out of stamina. He has noticed having more difficulty breathing when climbing stairs. In the past week he had more labored breathing, even with lying down at night. This week it is less labored, but still atypical for him. As he sits on the exam table now, he states he is not able to take a full breath every time. A few years ago he was able to walk 5 miles on a track. Last week, he was not able to walk more than 1 mile. He did push himself to complete 30 minutes of walking. No anginal symptoms, dizziness, or presyncope. No loss of consciousness.  In clinic today his blood pressure is elevated at 178/110. He does not believe the 300 mg labetalol is working for him.  He denies any palpitations, chest pain, or peripheral edema. No headaches, orthopnea, or PND.   Past Medical History:  Diagnosis Date   Allergic rhinitis, cause unspecified    Allergy    SEASONAL   Anxiety    Blood in stool    BPH (benign prostatic hyperplasia)    CAD (coronary artery disease)    DES mid lad 08/15/2009   Cancer ([place]) 02-28-2008   prostate   Chest pain, unspecified    Colon polyps  CVA (cerebrovascular accident due to intracerebral hemorrhage) [facility]) 2007 Spring   History of prostate cancer 2009   Dr [name]   HTN (hypertension)    Hypercholesterolemia    Hyperlipidemia    Hypokalemia    Insomnia, persistent    Neoplasm of uncertain behavior of skin    Stroke [facility])    2007   Syncope    Tubular adenoma of colon 2010    Past Surgical History:  Procedure Laterality Date   ANGIOPLASTY     with stents   CARDIOVERSION N/A 04/13/2021   Procedure: CARDIOVERSION;  Surgeon: [name], MD;  Location: [facility] ENDOSCOPY;  Service: Cardiovascular;  Laterality: N/A;    COLONOSCOPY  2016   Stark, TA's   POLYPECTOMY     PROSTATECTOMY  2009   TONSILLECTOMY      Current Medications: Current Outpatient Medications on File Prior to Visit  Medication Sig   apixaban (ELIQUIS) 5 MG TABS tablet TAKE 1 TABLET BY MOUTH TWICE DAILY   Cholecalciferol (VITAMIN D3) 2000 UNITS capsule Take 1 capsule (2,000 Units total) by mouth daily.   hydroxychloroquine (PLAQUENIL) 200 MG tablet Take 400 mg bid x 2 days, then 200 mg daily for 5 days, then stop   irbesartan-hydrochlorothiazide (AVALIDE) 150-12.5 MG tablet TAKE 1 TABLET BY MOUTH DAILY   potassium chloride (KLOR-CON M) 10 MEQ tablet Take 1 tablet (10 mEq total) by mouth 2 (two) times daily.   rosuvastatin (CRESTOR) 20 MG tablet Take 1 tablet (20 mg total) by mouth daily.   triamcinolone cream (KENALOG) 0.5 % Apply 1 application. topically 3 (three) times daily as needed (irritation). Use tid prn   zolpidem (AMBIEN) 10 MG tablet Take 1 tablet (10 mg total) by mouth at bedtime.   No current facility-administered medications on file prior to visit.     Allergies:   Amlodipine, Iodine, and Shellfish allergy   Social History   Tobacco Use   Smoking status: Never   Smokeless tobacco: Never  Vaping Use   Vaping Use: Never used  Substance Use Topics   Alcohol use: Yes    Comment: glass of wine- occ  per pt - occ mixed drink    Drug use: No    Family History: family history includes Diabetes in his brother; Heart disease in his father; Stroke in his mother. There is no history of Colon polyps, Esophageal cancer, Rectal cancer, Stomach cancer, or Colon cancer. father died age 7 from MI, 3 ppd smoker, overweight. Mother died age 63 of stroke. Brother has diabetes. 3 children, all heathly  ROS:   Please see the history of present illness.   (+) Fatigue/Malaise (+) Shortness of breath Additional pertinent ROS otherwise unremarkable.   EKGs/Labs/Other Studies Reviewed:    The following studies were reviewed  today:  Echo 04/11/2021: 1. Left ventricular ejection fraction, by estimation, is 45 to 50%. The  left ventricle has mildly decreased function. The left ventricle  demonstrates global hypokinesis. Left ventricular diastolic parameters  were normal.   2. Right ventricular systolic function is normal. The right ventricular  size is mildly enlarged. There is normal pulmonary artery systolic  pressure.   3. Left atrial size was severely dilated.   4. Right atrial size was moderately dilated.   5. The mitral valve is degenerative. No evidence of mitral valve  regurgitation. No evidence of mitral stenosis.   6. The aortic valve is calcified. There is mild calcification of the  aortic valve. There is mild thickening of the  aortic valve. Aortic valve  regurgitation is mild. Mild to moderate aortic valve  sclerosis/calcification is present, without any evidence  of aortic stenosis. Aortic regurgitation PHT measures 351 msec.   7. The inferior vena cava is normal in size with greater than 50%  respiratory variability, suggesting right atrial pressure of 3 mmHg.   US Venous Right LE 06/16/2020: IMPRESSION: No evidence of DVT within the right lower extremity.  Cath 07/31/2011 Left main coronary artery was a very short segment.  There was near side-by-side separate ostia.  No significant disease.  The proximal LAD had 30% to 40% tubular disease.  The mid LAD had 50% multiple discrete lesions.  This was unchanged from the previous catheterization done in 2010.  There was a widely patent stent in the distal LAD.  The LAD was a large artery that wrapped the apex.   The LAD actually supplied most of the PDA territory, so it was difficult to know what dominance the patient was.  Right coronary artery was small and had no significant disease.   The circumflex coronary artery was somewhat larger than right coronary artery, but again the PDA was mostly supplied by the distal LAD.  There was 20% to 30%  ostial lesions proximally.  The ostium of the first obtuse marginal branch had a 40% lesion.   The patient also had a very large diagonal branch, which did not have significant disease and provided most of the anterolateral wall.   Left ventriculography.  Left ventriculography was normal.  EF was 60%. There was no gradient across the aortic valve and no MR.  Aortic pressure was 132/71.  LV pressure was somewhat inaccurate with the LV tracing at 160/11.   IMPRESSION:  The patient has continued moderate disease in the mid LAD after the diagonal takeoff.  I closely reviewed his previous film from 2010 and there has been no change in this disease.  His stent is widely patent and he does not have any new disease in the right coronary artery or circ.  Clinically when I talked to the patient it sounded like he had a seizure.  There is no antecedent chest pain, palpitations, or cardiac symptoms.   The patient was fairly insistent on having a diagnostic cath rather than a stress test.   I have encouraged the patient to follow up with Neurology.  We have already arranged for him to have an EEG, MRI with MRA, and neurological followup.   Given the patency of the stent, normal LV function would be highly unlikely that his "seizure event was cardiac in etiology."  At the end of the case, a TR band was placed with good hemostasis.  He tolerated the procedure well.  Cath done by Dr. Olevia Perches. During the procedure, he had a contrast reaction to the dye (reported prior iodine/shellfish allergy), was treated for this. Received PCI to LAD with stenosis form 95% to 0%.  EKG:  EKG is personally reviewed.   01/03/2023:  atypical atrial flutter/afib 07/15/2022:  sinus bradycardia at 53 bpm, LAFB 10/08/2021: EKG was not ordered. 05/11/2021: sinus bradycardia, 54 bpm 09/22/20: sinus bradycardia, LAFB at 58 bpm  Recent Labs: 09/30/2022: ALT 18; BUN 15; Creatinine, Ser 1.05; Hemoglobin 14.9; Platelets 109.0;  Potassium 3.7; Sodium 140; TSH 2.06   Recent Lipid Panel    Component Value Date/Time   CHOL 191 09/30/2022 1015   TRIG 85.0 09/30/2022 1015   HDL 47.00 09/30/2022 1015   CHOLHDL 4 09/30/2022 1015   VLDL 17.0  09/30/2022 1015   LDLCALC 127 (H) 09/30/2022 1015   LDLDIRECT 296.1 08/12/2013 0936    Physical Exam:    VS:  BP (!) 178/110   Pulse 98   Ht 5' 8"$  (1.727 m)   Wt 183 lb (83 kg)   BMI 27.83 kg/m   Recheck 170/78  Wt Readings from Last 3 Encounters:  01/03/23 183 lb (83 kg)  12/20/22 175 lb (79.4 kg)  10/01/22 181 lb (82.1 kg)    GEN: Well nourished, well developed in no acute distress HEENT: Normal, moist mucous membranes NECK: No JVD CARDIAC: Irregularly irregular, normal S1 and S2, no rubs or gallops. No murmur. VASCULAR: Radial and DP pulses 2+ bilaterally. No carotid bruits RESPIRATORY:  Clear to auscultation without rales, wheezing or rhonchi  ABDOMEN: Soft, non-tender, non-distended MUSCULOSKELETAL:  Ambulates independently SKIN: Warm and dry, no edema NEUROLOGIC:  Alert and oriented x 3. No focal neuro deficits noted. PSYCHIATRIC:  Normal affect     ASSESSMENT:    1. Atypical atrial flutter (HCC)     PLAN:    Atrial flutter: recurrence -CHA2DS2/VAS Stroke Risk Points= 4  -no bleeding issues, continue apixaban -changing labetalol to carvedilol -discussed cardioversion. He is amenable. Labs week of 2/12, ordered  Shared Decision Making/Informed Consent The risks (stroke, cardiac arrhythmias rarely resulting in the need for a temporary or permanent pacemaker, skin irritation or burns and complications associated with conscious sedation including aspiration, arrhythmia, respiratory failure and death), benefits (restoration of normal sinus rhythm) and alternatives of a direct current cardioversion were explained in detail to Mr. Weaver and he agrees to proceed.    CAD with history of prior stent: asymptomatic.  -stopped aspirin with start of  apixaban -continue rosuvastatin 20 mg daily -LDL goal <70, see below re: patient preference -reviewed red flag warning signs that need immediate medical attention  Hypertension: Overall goal <130/80 -above goal today, even on recheck. -continue irbesartan-HCTZ 150-12.5 mg daily. We discussed that taking this at night may exacerbate nocturia, but he feels this is manageable and current regimen is working well -changing labetalol to carvedilol, given instructions on home titration -has been on amlodipine in the past if additional agent needed in the future. Alternative would be to increase irbesartan-HCTZ dose to maximum dosing.  -overall, his preference is to not make any changes to his regimen at this time.   Hypercholesterolemia: LDL goal <70 -reviewed lipids from 03/27/22 per [facility]. LDL 136, Tchol 199.  -we have discussed options for more intense management. Given his LDL on 20 mg rosuvastatin, suspect he will need PCSK9i to get to goal. He is happy with his current regimen and declines changes. Continue to address. -continue rosuvastatin 20 mg daily per patient preference  Cardiac risk counseling and prevention recommendations: -recommend heart healthy/Mediterranean diet, with whole grains, fruits, vegetable, fish, lean meats, nuts, and olive oil. Limit salt. -recommend moderate walking, 3-5 times/week for 30-50 minutes each session. Aim for at least 150 minutes.week. Goal should be pace of 3 miles/hours, or walking 1.5 miles in 30 minutes -recommend avoidance of tobacco products. Avoid excess alcohol.  Plan for follow up: Scheduled cardioversion with Dr Johney Frame 01/21/23. Follow-up with me two weeks following cardioversion, or sooner as needed.  [name], MD, PhD, [facility]   Medication Adjustments/Labs and Tests Ordered: Current medicines are reviewed at length with the patient today.  Concerns regarding medicines are outlined above.   Orders  Placed This Encounter  Procedures   Basic metabolic panel  CBC   EKG 12-Lead   Meds ordered this encounter  Medications   carvedilol (COREG) 12.5 MG tablet    Sig: Take 1 tablet (12.5 mg total) by mouth 2 (two) times daily.    Dispense:  60 tablet    Refill:  3    New dose, if patient tolerates we will send 90 day supplies   Patient Instructions  Medication Instructions:  Your physician has recommended you make the following change in your medication:   We are going to start carvedilol 12.5 mg twice a day. This replaces the labetalol. If after a few days your blood pressure and heart rate are still elevated, contact me and we will increase the carvedilol to 25 mg twice daily.   *If you need a refill on your cardiac medications before your next appointment, please call your pharmacy*   Lab Work: Please return for Lab work the week of 2/12 for BMP and CBC . You may come to the...   [facility] (3rd floor) [address] [place], [place] [address]  Open: 8am-Noon and 1pm-4:30pm  Please ring the doorbell on the small table when you exit the elevator and the Lab Tech will come get you  [place] at [facility] [address] [place], [place], [place] [address] Open: 8am-1pm, then 2pm-4:30pm   [place]- Please see attached locations sheet stapled to your lab work with address and hours.   If you have labs (blood work) drawn today and your tests are completely normal, you will receive your results only by: [place] (if you have MyChart) OR A paper copy in the mail If you have any lab test that is abnormal or we need to change your treatment, we will call you to review the results.  Follow-Up: At [facility], you and your health needs are our priority.  As part of our continuing mission to provide you with exceptional heart care, we have created designated Provider Care Teams.  These Care Teams include your primary  Cardiologist (physician) and Advanced Practice Providers (APPs -  Physician Assistants and Nurse Practitioners) who all work together to provide you with the care you need, when you need it.  We recommend signing up for the patient portal called "MyChart".  Sign up information is provided on this After Visit Summary.  MyChart is used to connect with patients for Virtual Visits (Telemedicine).  Patients are able to view lab/test results, encounter notes, upcoming appointments, etc.  Non-urgent messages can be sent to your provider as well.   To learn more about what you can do with MyChart, go to [email or web address].    Your next appointment:     Other Instructions You are scheduled for a Cardioversion on Tuesday, February 20 with Dr. Johney Frame.  Please arrive at the [facility] (Main Entrance A[facility] at [facility]: [address] [place], [place] [address] at 9:30 AM.   DIET:  Nothing to eat or drink after midnight except a sip of water with medications (see medication instructions below)  MEDICATION INSTRUCTIONS: Continue taking your anticoagulant (blood thinner): Apixaban (Eliquis).  You will need to continue this after your procedure until you are told by your provider that it is safe to stop.    LABS:  BMP, CBC week of 2/12   FYI:  For your safety, and to allow [place] to monitor your vital signs accurately during the surgery/procedure we request: If you have artificial nails, gel coating, SNS etc, please  have those removed prior to your surgery/procedure. Not having the nail coverings /polish removed may result in cancellation or delay of your surgery/procedure.  You must have a responsible person to drive you home and stay in the waiting area during your procedure. Failure to do so could result in cancellation.  Bring your insurance cards.  *Special Note: Every effort is made to have your procedure done on time. Occasionally there are emergencies that occur at the hospital that  may cause delays. Please be patient if a delay does occur.      I,Mathew Stumpf,acting as a Education administrator for [employer], MD.,have documented all relevant documentation on the behalf of [name], MD,as directed by  [name], MD while in the presence of [name], MD.  I, [name], MD, have reviewed all documentation for this visit. The documentation on 01/03/23 for the exam, diagnosis, procedures, and orders are all accurate and complete.   Signed, [name], MD PhD 01/03/2023    Andrew Joseph

## 2023-01-03 NOTE — Patient Instructions (Addendum)
Medication Instructions:  Your physician has recommended you make the following change in your medication:   We are going to start carvedilol 12.5 mg twice a day. This replaces the labetalol. If after a few days your blood pressure and heart rate are still elevated, contact me and we will increase the carvedilol to 25 mg twice daily.   *If you need a refill on your cardiac medications before your next appointment, please call your pharmacy*   Lab Work: Please return for Lab work the week of 2/12 for BMP and CBC . You may come to the...   [facility] (3rd floor) [address], [place], [place] [address]  Open: 8am-Noon and 1pm-4:30pm  Please ring the doorbell on the small table when you exit the elevator and the Lab Tech will come get you  [place] at [facility] [address] [place], [place], [place] [address] Open: 8am-1pm, then 2pm-4:30pm   [place]- Please see attached locations sheet stapled to your lab work with address and hours.   If you have labs (blood work) drawn today and your tests are completely normal, you will receive your results only by: [place] (if you have MyChart) OR A paper copy in the mail If you have any lab test that is abnormal or we need to change your treatment, we will call you to review the results.  Follow-Up: At [facility], you and your health needs are our priority.  As part of our continuing mission to provide you with exceptional heart care, we have created designated Provider Care Teams.  These Care Teams include your primary Cardiologist (physician) and Advanced Practice Providers (APPs -  Physician Assistants and Nurse Practitioners) who all work together to provide you with the care you need, when you need it.  We recommend signing up for the patient portal called "MyChart".  Sign up information is provided on this After Visit Summary.  MyChart is used to connect with patients for  Virtual Visits (Telemedicine).  Patients are able to view lab/test results, encounter notes, upcoming appointments, etc.  Non-urgent messages can be sent to your provider as well.   To learn more about what you can do with MyChart, go to [email or web address].    Your next appointment:   2 weeks post cardioversion with Dr. Harrell Gave or Laurann Montana, NP  Other Instructions You are scheduled for a Cardioversion on Tuesday, February 20 with Dr. Johney Frame.  Please arrive at the [facility] (Main Entrance A) at [facility]: [address] [place], [place] [address] at 9:30 AM.   DIET:  Nothing to eat or drink after midnight except a sip of water with medications (see medication instructions below)  MEDICATION INSTRUCTIONS: Continue taking your anticoagulant (blood thinner): Apixaban (Eliquis).  You will need to continue this after your procedure until you are told by your provider that it is safe to stop.    LABS:  BMP, CBC week of 2/12   FYI:  For your safety, and to allow [place] to monitor your vital signs accurately during the surgery/procedure we request: If you have artificial nails, gel coating, SNS etc, please have those removed prior to your surgery/procedure. Not having the nail coverings /polish removed may result in cancellation or delay of your surgery/procedure.  You must have a responsible person to drive you home and stay in the waiting area during your procedure. Failure to do so could result in cancellation.  Bring your insurance cards.  *Special  Note: Every effort is made to have your procedure done on time. Occasionally there are emergencies that occur at the hospital that may cause delays. Please be patient if a delay does occur.

## 2023-01-04 ENCOUNTER — Other Ambulatory Visit: Payer: Self-pay | Admitting: Internal Medicine

## 2023-01-14 ENCOUNTER — Ambulatory Visit (HOSPITAL_BASED_OUTPATIENT_CLINIC_OR_DEPARTMENT_OTHER): Payer: Medicare PPO | Admitting: Cardiology

## 2023-01-15 DIAGNOSIS — I484 Atypical atrial flutter: Secondary | ICD-10-CM | POA: Diagnosis not present

## 2023-01-16 LAB — BASIC METABOLIC PANEL WITH GFR
BUN/Creatinine Ratio: 13 (ref 10–24)
BUN: 14 mg/dL (ref 8–27)
CO2: 22 mmol/L (ref 20–29)
Calcium: 9.1 mg/dL (ref 8.6–10.2)
Chloride: 104 mmol/L (ref 96–106)
Creatinine, Ser: 1.09 mg/dL (ref 0.76–1.27)
Glucose: 93 mg/dL (ref 70–99)
Potassium: 3.6 mmol/L (ref 3.5–5.2)
Sodium: 140 mmol/L (ref 134–144)
eGFR: 70 mL/min/{1.73_m2}

## 2023-01-16 LAB — CBC
Hematocrit: 42 % (ref 37.5–51.0)
Hemoglobin: 13.6 g/dL (ref 13.0–17.7)
MCH: 29.2 pg (ref 26.6–33.0)
MCHC: 32.4 g/dL (ref 31.5–35.7)
MCV: 90 fL (ref 79–97)
Platelets: 114 10*3/uL — ABNORMAL LOW (ref 150–450)
RBC: 4.65 x10E6/uL (ref 4.14–5.80)
RDW: 14.8 % (ref 11.6–15.4)
WBC: 4.7 10*3/uL (ref 3.4–10.8)

## 2023-01-17 ENCOUNTER — Telehealth (HOSPITAL_BASED_OUTPATIENT_CLINIC_OR_DEPARTMENT_OTHER): Payer: Self-pay

## 2023-01-17 NOTE — Telephone Encounter (Addendum)
Results called to patient who verbalizes understanding!     ----- Message from Loel Dubonnet, NP sent at 01/17/2023 11:32 AM EST ----- CBC with no evidence of anemia nor infection.  Stable platelet count. Stable kidney function. Normal electrolytes. Good result! Proceed with cardioversion as scheduled.

## 2023-01-21 ENCOUNTER — Ambulatory Visit (HOSPITAL_BASED_OUTPATIENT_CLINIC_OR_DEPARTMENT_OTHER): Payer: Medicare PPO | Admitting: Anesthesiology

## 2023-01-21 ENCOUNTER — Other Ambulatory Visit: Payer: Self-pay

## 2023-01-21 ENCOUNTER — Ambulatory Visit (HOSPITAL_COMMUNITY): Payer: Medicare PPO | Admitting: Anesthesiology

## 2023-01-21 ENCOUNTER — Encounter (HOSPITAL_BASED_OUTPATIENT_CLINIC_OR_DEPARTMENT_OTHER): Payer: Self-pay | Admitting: Cardiology

## 2023-01-21 ENCOUNTER — Ambulatory Visit (HOSPITAL_COMMUNITY)
Admission: RE | Admit: 2023-01-21 | Discharge: 2023-01-21 | Disposition: A | Discharge status: Home / Self Care | Payer: Medicare PPO | Attending: Cardiology | Admitting: Cardiology

## 2023-01-21 ENCOUNTER — Encounter (HOSPITAL_COMMUNITY)
Admission: RE | Disposition: A | Discharge status: Home / Self Care | Payer: Self-pay | Source: Home / Self Care | Attending: Cardiology

## 2023-01-21 DIAGNOSIS — I4892 Unspecified atrial flutter: Secondary | ICD-10-CM

## 2023-01-21 DIAGNOSIS — E782 Mixed hyperlipidemia: Secondary | ICD-10-CM | POA: Insufficient documentation

## 2023-01-21 DIAGNOSIS — Z7901 Long term (current) use of anticoagulants: Secondary | ICD-10-CM | POA: Diagnosis not present

## 2023-01-21 DIAGNOSIS — I2510 Atherosclerotic heart disease of native coronary artery without angina pectoris: Secondary | ICD-10-CM

## 2023-01-21 DIAGNOSIS — Z79899 Other long term (current) drug therapy: Secondary | ICD-10-CM | POA: Insufficient documentation

## 2023-01-21 DIAGNOSIS — I10 Essential (primary) hypertension: Secondary | ICD-10-CM

## 2023-01-21 DIAGNOSIS — I4891 Unspecified atrial fibrillation: Secondary | ICD-10-CM

## 2023-01-21 DIAGNOSIS — Z8249 Family history of ischemic heart disease and other diseases of the circulatory system: Secondary | ICD-10-CM | POA: Diagnosis not present

## 2023-01-21 DIAGNOSIS — Z955 Presence of coronary angioplasty implant and graft: Secondary | ICD-10-CM

## 2023-01-21 DIAGNOSIS — Z8673 Personal history of transient ischemic attack (TIA), and cerebral infarction without residual deficits: Secondary | ICD-10-CM | POA: Diagnosis not present

## 2023-01-21 HISTORY — PX: CARDIOVERSION: SHX1299

## 2023-01-21 SURGERY — CARDIOVERSION
Anesthesia: General

## 2023-01-21 MED ORDER — LIDOCAINE 2% (20 MG/ML) 5 ML SYRINGE
INTRAMUSCULAR | Status: DC | PRN
  Administered 2023-01-21: 100 mg via INTRAVENOUS

## 2023-01-21 MED ORDER — PROPOFOL 10 MG/ML IV BOLUS
INTRAVENOUS | Status: DC | PRN
  Administered 2023-01-21: 70 mg via INTRAVENOUS

## 2023-01-21 MED ORDER — SODIUM CHLORIDE 0.9 % IV SOLN: INTRAVENOUS | Status: DC

## 2023-01-21 NOTE — Transfer of Care (Signed)
Immediate Anesthesia Transfer of Care Note  Patient: Andrew Joseph.  Procedure(s) Performed: CARDIOVERSION  Patient Location: Endoscopy Unit  Anesthesia Type:MAC  Level of Consciousness: drowsy  Airway & Oxygen Therapy: Patient Spontanous Breathing  Post-op Assessment: Report given to RN and Post -op Vital signs reviewed and stable  Post vital signs: Reviewed and stable  Last Vitals:  Vitals Value Taken Time  BP 144/89   Temp    Pulse 63   Resp 12   SpO2 95%     Last Pain:  Vitals:   01/21/23 0943  PainSc: 0-No pain         Complications: No notable events documented.

## 2023-01-21 NOTE — CV Procedure (Signed)
Procedure: Electrical Cardioversion Indications:  Atrial Fibrillation  Procedure Details:  Consent: Risks of procedure as well as the alternatives and risks of each were explained to the (patient/caregiver).  Consent for procedure obtained.  Time Out: Verified patient identification, verified procedure, site/side was marked, verified correct patient position, special equipment/implants available, medications/allergies/relevent history reviewed, required imaging and test results available. PERFORMED.  Patient placed on cardiac monitor, pulse oximetry, supplemental oxygen as necessary.  Sedation given:  Propofol 23m; lidocaine 1036mPacer pads placed anterior and posterior chest.  Cardioverted 1 time(s).  Cardioversion with synchronized biphasic 150J shock.  Evaluation: Findings: Post procedure EKG shows:  Sinus bradycardia with PACs Complications: None Patient did tolerate procedure well.  Time Spent Directly with the Patient:  3584mtes   Andrew Bergeron20/2024, 10:32 AM

## 2023-01-21 NOTE — Interval H&P Note (Signed)
History and Physical Interval Note:  01/21/2023 9:57 AM  Andrew Joseph.  has presented today for surgery, with the diagnosis of afib.  The various methods of treatment have been discussed with the patient and family. After consideration of risks, benefits and other options for treatment, the patient has consented to  Procedure(s): CARDIOVERSION (N/A) as a surgical intervention.  The patient's history has been reviewed, patient examined, no change in status, stable for surgery.  I have reviewed the patient's chart and labs.  Questions were answered to the patient's satisfaction.     Freada Bergeron

## 2023-01-21 NOTE — Anesthesia Postprocedure Evaluation (Signed)
Anesthesia Post Note  Patient: Andrew Joseph.  Procedure(s) Performed: CARDIOVERSION     Patient location during evaluation: PACU Anesthesia Type: General Level of consciousness: awake and alert Pain management: pain level controlled Vital Signs Assessment: post-procedure vital signs reviewed and stable Respiratory status: spontaneous breathing, nonlabored ventilation, respiratory function stable and patient connected to nasal cannula oxygen Cardiovascular status: blood pressure returned to baseline and stable Postop Assessment: no apparent nausea or vomiting Anesthetic complications: no  No notable events documented.  Last Vitals:  Vitals:   01/21/23 1126 01/21/23 1131  BP: (!) 167/93 (!) 175/92  Pulse: 67 65  Resp: 17 15  SpO2: 94% 94%    Last Pain:  Vitals:   01/21/23 1131  PainSc: 0-No pain                 Yussuf Sawyers S

## 2023-01-21 NOTE — Discharge Instructions (Signed)

## 2023-01-21 NOTE — Anesthesia Preprocedure Evaluation (Signed)
Anesthesia Evaluation  Patient identified by MRN, date of birth, ID band Patient awake    Reviewed: Allergy & Precautions, H&P , NPO status , Patient's Chart, lab work & pertinent test results  Airway Mallampati: II  TM Distance: >3 FB Neck ROM: Full    Dental no notable dental hx.    Pulmonary neg pulmonary ROS   Pulmonary exam normal breath sounds clear to auscultation       Cardiovascular hypertension, + CAD, + Cardiac Stents and + DOE  Normal cardiovascular exam+ dysrhythmias Atrial Fibrillation  Rhythm:Irregular Rate:Normal  1. Left ventricular ejection fraction, by estimation, is 45 to 50%. The  left ventricle has mildly decreased function. The left ventricle  demonstrates global hypokinesis. Left ventricular diastolic parameters  were normal.   2. Right ventricular systolic function is normal. The right ventricular  size is mildly enlarged. There is normal pulmonary artery systolic  pressure.   3. Left atrial size was severely dilated.   4. Right atrial size was moderately dilated.   5. The mitral valve is degenerative. No evidence of mitral valve  regurgitation. No evidence of mitral stenosis.   6. The aortic valve is calcified. There is mild calcification of the  aortic valve. There is mild thickening of the aortic valve. Aortic valve  regurgitation is mild. Mild to moderate aortic valve  sclerosis/calcification is present, without any evidence  of aortic stenosis. Aortic regurgitation PHT measures 351 msec.   7. The inferior vena cava is normal in size with greater than 50%  respiratory variability, suggesting right atrial pressure of 3 mmHg     Neuro/Psych CVA  negative psych ROS   GI/Hepatic negative GI ROS, Neg liver ROS,,,  Endo/Other  negative endocrine ROS    Renal/GU negative Renal ROS  negative genitourinary   Musculoskeletal negative musculoskeletal ROS (+)    Abdominal   Peds negative  pediatric ROS (+)  Hematology negative hematology ROS (+)   Anesthesia Other Findings   Reproductive/Obstetrics negative OB ROS                             Anesthesia Physical Anesthesia Plan  ASA: 3  Anesthesia Plan: General   Post-op Pain Management: Minimal or no pain anticipated   Induction: Intravenous  PONV Risk Score and Plan: 2 and Treatment may vary due to age or medical condition  Airway Management Planned: Mask  Additional Equipment:   Intra-op Plan:   Post-operative Plan:   Informed Consent: I have reviewed the patients History and Physical, chart, labs and discussed the procedure including the risks, benefits and alternatives for the proposed anesthesia with the patient or authorized representative who has indicated his/her understanding and acceptance.     Dental advisory given  Plan Discussed with: CRNA and Surgeon  Anesthesia Plan Comments:        Anesthesia Quick Evaluation

## 2023-01-22 ENCOUNTER — Ambulatory Visit (HOSPITAL_BASED_OUTPATIENT_CLINIC_OR_DEPARTMENT_OTHER): Payer: Medicare PPO | Admitting: Cardiology

## 2023-01-22 ENCOUNTER — Encounter (HOSPITAL_COMMUNITY): Payer: Self-pay | Admitting: Cardiology

## 2023-02-04 ENCOUNTER — Encounter (HOSPITAL_BASED_OUTPATIENT_CLINIC_OR_DEPARTMENT_OTHER): Payer: Self-pay | Admitting: Cardiology

## 2023-02-04 ENCOUNTER — Ambulatory Visit (INDEPENDENT_AMBULATORY_CARE_PROVIDER_SITE_OTHER): Payer: Medicare PPO | Admitting: Cardiology

## 2023-02-04 VITALS — BP 174/92 | HR 91 | Ht 68.0 in | Wt 184.9 lb

## 2023-02-04 DIAGNOSIS — I2510 Atherosclerotic heart disease of native coronary artery without angina pectoris: Secondary | ICD-10-CM | POA: Diagnosis not present

## 2023-02-04 DIAGNOSIS — E7800 Pure hypercholesterolemia, unspecified: Secondary | ICD-10-CM | POA: Diagnosis not present

## 2023-02-04 DIAGNOSIS — I10 Essential (primary) hypertension: Secondary | ICD-10-CM

## 2023-02-04 DIAGNOSIS — Z7189 Other specified counseling: Secondary | ICD-10-CM | POA: Diagnosis not present

## 2023-02-04 DIAGNOSIS — I484 Atypical atrial flutter: Secondary | ICD-10-CM | POA: Diagnosis not present

## 2023-02-04 DIAGNOSIS — Z955 Presence of coronary angioplasty implant and graft: Secondary | ICD-10-CM | POA: Diagnosis not present

## 2023-02-04 MED ORDER — LABETALOL HCL 300 MG PO TABS: 300.0000 mg | ORAL_TABLET | Freq: Three times a day (TID) | ORAL | 3 refills | Status: DC

## 2023-02-04 NOTE — Patient Instructions (Signed)
Medication Instructions:  INCREASE YOUR LABETALOL TO THREE TIMES A DAY   *If you need a refill on your cardiac medications before your next appointment, please call your pharmacy*  Lab Work: NONE  Testing/Procedures: NONE  Follow-Up: At [facility], you and your health needs are our priority.  As part of our continuing mission to provide you with exceptional heart care, we have created designated Provider Care Teams.  These Care Teams include your primary Cardiologist (physician) and Advanced Practice Providers (APPs -  Physician Assistants and Nurse Practitioners) who all work together to provide you with the care you need, when you need it.  We recommend signing up for the patient portal called "MyChart".  Sign up information is provided on this After Visit Summary.  MyChart is used to connect with patients for Virtual Visits (Telemedicine).  Patients are able to view lab/test results, encounter notes, upcoming appointments, etc.  Non-urgent messages can be sent to your provider as well.   To learn more about what you can do with MyChart, go to [email or web address].    Your next appointment:   6-8 WEEKS   The format for your next appointment:   In Person  Provider:   [name], MD

## 2023-02-04 NOTE — Progress Notes (Unsigned)
Cardiology Office Note:    Date:  02/05/2023   ID:  Andrew Essex., DOB Dec 30, 1945, MRN [id number]  PCP:  [name], MD  Cardiologist:  [name], MD  Referring MD: [name], MD   CC: follow up  History of Present Illness:    Andrew Stickels. is a 77 y.o. male with a hx of CAD with remote stent, paroxysmal atrial flutter s/p cardioversion who is seen for follow up. I initially met him 09/07/2019 as a new consult at the request of [name], [name], MD for the evaluation and management of CAD.  CV history: distal LAD stent, last cath 28-Feb-2011 Father died of MI age 10. Hypercholesterolemia: prior LDL 124. Was told in the past his total cholesterol was over 400. Was happy that his total cholesterol is less than 200.  Cardiovascular risk factors: Prior clinical ASCVD: CAD with PCI to LAD. ?CVA in February 27, 2006 (quarter sized bleed in his brain--lightheadedness/dizziness), unclear ?seizure event 2011-02-28 Comorbid conditions: hypertension, hyperlipidemia Prior cardiac testing: cath 2010, 2012. Echo 02-27-14. MPI 1997, 200003/29/02March 29, 2003  Today: Had cardioversion on 01/21/23 with Dr. Johney Frame. Converted with 1 shock to sinus with PACs.   Has felt better, disappointed to learn that he is not in sinus rhythm today. ECG shows atrial flutter, similar to prior.  BP has been elevated, as high as 212/112 the day after our last visit. Only took one dose of carvedilol.  Reports that he called in 01/04/23 about this, told he could go back on the labetalol 300 mg TID for 5 days but not to take labetalol and carvedilol together. Now back to labetalol 300 mg BID. Reviewed home logs, most 150-170/70s-80s. Reading this AM was 182/84. Now taking AM dose between 9-9:30 AM and second dose around midnight.   Reviewed options for management of arrhythmia at length today, see below.    Past Medical History:  Diagnosis Date   Allergic rhinitis, cause unspecified    Allergy    SEASONAL    Anxiety    Blood in stool    BPH (benign prostatic hyperplasia)    CAD (coronary artery disease)    DES mid lad 08/15/2009   Cancer ([place]) 02-28-2008   prostate   Chest pain, unspecified    Colon polyps    CVA (cerebrovascular accident due to intracerebral hemorrhage) [facility]) 2007 Spring   History of prostate cancer February 28, 2008   Dr [name]   HTN (hypertension)    Hypercholesterolemia    Hyperlipidemia    Hypokalemia    Insomnia, persistent    Neoplasm of uncertain behavior of skin    Stroke [facility])    02-27-06   Syncope    Tubular adenoma of colon 2009-02-27    Past Surgical History:  Procedure Laterality Date   ANGIOPLASTY     with stents   CARDIOVERSION N/A 04/13/2021   Procedure: CARDIOVERSION;  Surgeon: [name], MD;  Location: [place];  Service: Cardiovascular;  Laterality: N/A;   CARDIOVERSION N/A 01/21/2023   Procedure: CARDIOVERSION;  Surgeon: Freada Bergeron, MD;  Location: [facility] ENDOSCOPY;  Service: Cardiovascular;  Laterality: N/A;   COLONOSCOPY  2015-02-28   [facility], TA's   POLYPECTOMY     PROSTATECTOMY  02-28-2008   TONSILLECTOMY      Current Medications: Current Outpatient Medications on File Prior to Visit  Medication Sig   apixaban (ELIQUIS) 5 MG TABS tablet TAKE 1 TABLET BY MOUTH TWICE DAILY   irbesartan-hydrochlorothiazide (AVALIDE) 150-12.5 MG tablet TAKE  1 TABLET BY MOUTH DAILY   rosuvastatin (CRESTOR) 20 MG tablet Take 1 tablet (20 mg total) by mouth daily.   triamcinolone cream (KENALOG) 0.5 % APPLY TOPICALLY 3 TIMES DAILY AS NEEDED (IRRITATION)   zolpidem (AMBIEN) 10 MG tablet Take 1 tablet (10 mg total) by mouth at bedtime.   potassium chloride (KLOR-CON M) 10 MEQ tablet Take 1 tablet (10 mEq total) by mouth 2 (two) times daily. (Patient not taking: Reported on 01/16/2023)   No current facility-administered medications on file prior to visit.     Allergies:   Amlodipine, Iodine, and Shellfish allergy   Social History   Tobacco Use   Smoking status: Never   Smokeless  tobacco: Never  Vaping Use   Vaping Use: Never used  Substance Use Topics   Alcohol use: Yes    Comment: glass of wine- occ  per pt - occ mixed drink    Drug use: No    Family History: family history includes Diabetes in his brother; Heart disease in his father; Stroke in his mother. There is no history of Colon polyps, Esophageal cancer, Rectal cancer, Stomach cancer, or Colon cancer. father died age 70 from MI, 3 ppd smoker, overweight. Mother died age 83 of stroke. Brother has diabetes. 3 children, all heathly  ROS:   Please see the history of present illness.   (+) Fatigue/Malaise (+) Shortness of breath Additional pertinent ROS otherwise unremarkable.   EKGs/Labs/Other Studies Reviewed:    The following studies were reviewed today:  Echo 04/11/2021: 1. Left ventricular ejection fraction, by estimation, is 45 to 50%. The  left ventricle has mildly decreased function. The left ventricle  demonstrates global hypokinesis. Left ventricular diastolic parameters  were normal.   2. Right ventricular systolic function is normal. The right ventricular  size is mildly enlarged. There is normal pulmonary artery systolic  pressure.   3. Left atrial size was severely dilated.   4. Right atrial size was moderately dilated.   5. The mitral valve is degenerative. No evidence of mitral valve  regurgitation. No evidence of mitral stenosis.   6. The aortic valve is calcified. There is mild calcification of the  aortic valve. There is mild thickening of the aortic valve. Aortic valve  regurgitation is mild. Mild to moderate aortic valve  sclerosis/calcification is present, without any evidence  of aortic stenosis. Aortic regurgitation PHT measures 351 msec.   7. The inferior vena cava is normal in size with greater than 50%  respiratory variability, suggesting right atrial pressure of 3 mmHg.   US Venous Right LE 06/16/2020: IMPRESSION: No evidence of DVT within the right lower  extremity.  Cath 07/31/2011 Left main coronary artery was a very short segment.  There was near side-by-side separate ostia.  No significant disease.  The proximal LAD had 30% to 40% tubular disease.  The mid LAD had 50% multiple discrete lesions.  This was unchanged from the previous catheterization done in 2010.  There was a widely patent stent in the distal LAD.  The LAD was a large artery that wrapped the apex.   The LAD actually supplied most of the PDA territory, so it was difficult to know what dominance the patient was.  Right coronary artery was small and had no significant disease.   The circumflex coronary artery was somewhat larger than right coronary artery, but again the PDA was mostly supplied by the distal LAD.  There was 20% to 30% ostial lesions proximally.  The ostium of the  first obtuse marginal branch had a 40% lesion.   The patient also had a very large diagonal branch, which did not have significant disease and provided most of the anterolateral wall.   Left ventriculography.  Left ventriculography was normal.  EF was 60%. There was no gradient across the aortic valve and no MR.  Aortic pressure was 132/71.  LV pressure was somewhat inaccurate with the LV tracing at 160/11.   IMPRESSION:  The patient has continued moderate disease in the mid LAD after the diagonal takeoff.  I closely reviewed his previous film from 2010 and there has been no change in this disease.  His stent is widely patent and he does not have any new disease in the right coronary artery or circ.  Clinically when I talked to the patient it sounded like he had a seizure.  There is no antecedent chest pain, palpitations, or cardiac symptoms.   The patient was fairly insistent on having a diagnostic cath rather than a stress test.   I have encouraged the patient to follow up with Neurology.  We have already arranged for him to have an EEG, MRI with MRA, and neurological followup.   Given the  patency of the stent, normal LV function would be highly unlikely that his "seizure event was cardiac in etiology."  At the end of the case, a TR band was placed with good hemostasis.  He tolerated the procedure well.  Cath done by Dr. Olevia Perches. During the procedure, he had a contrast reaction to the dye (reported prior iodine/shellfish allergy), was treated for this. Received PCI to LAD with stenosis form 95% to 0%.  EKG:  EKG is personally reviewed.   02/04/23: atypical atrial flutter/afib at 91 bpm 01/03/2023:  atypical atrial flutter/afib 07/15/2022:  sinus bradycardia at 53 bpm, LAFB 10/08/2021: EKG was not ordered. 05/11/2021: sinus bradycardia, 54 bpm 09/22/20: sinus bradycardia, LAFB at 58 bpm  Recent Labs: 09/30/2022: ALT 18; TSH 2.06 01/15/2023: BUN 14; Creatinine, Ser 1.09; Hemoglobin 13.6; Platelets 114; Potassium 3.6; Sodium 140   Recent Lipid Panel    Component Value Date/Time   CHOL 191 09/30/2022 1015   TRIG 85.0 09/30/2022 1015   HDL 47.00 09/30/2022 1015   CHOLHDL 4 09/30/2022 1015   VLDL 17.0 09/30/2022 1015   LDLCALC 127 (H) 09/30/2022 1015   LDLDIRECT 296.1 08/12/2013 0936    Physical Exam:    VS:  BP (!) 174/92 (BP Location: Left Arm, Patient Position: Sitting, Cuff Size: Large)   Pulse 91   Ht '5\' 8"'$  (1.727 m)   Wt 184 lb 14.4 oz (83.9 kg)   BMI 28.11 kg/m   Recheck 170/78  Wt Readings from Last 3 Encounters:  02/04/23 184 lb 14.4 oz (83.9 kg)  01/21/23 178 lb (80.7 kg)  01/03/23 183 lb (83 kg)    GEN: Well nourished, well developed in no acute distress HEENT: Normal, moist mucous membranes NECK: No JVD CARDIAC: Irregularly irregular, normal S1 and S2, no rubs or gallops. No murmur. VASCULAR: Radial and DP pulses 2+ bilaterally. No carotid bruits RESPIRATORY:  Clear to auscultation without rales, wheezing or rhonchi  ABDOMEN: Soft, non-tender, non-distended MUSCULOSKELETAL:  Ambulates independently SKIN: Warm and dry, no edema NEUROLOGIC:  Alert and  oriented x 3. No focal neuro deficits noted. PSYCHIATRIC:  Normal affect     ASSESSMENT:    1. Atypical atrial flutter ([place])   2. Essential hypertension   3. History of coronary angioplasty with insertion of stent   4.  Coronary artery disease involving native coronary artery of native heart without angina pectoris   5. Pure hypercholesterolemia   6. Counseling on health promotion and disease prevention    PLAN:    Atrial flutter: recurrence, returned after cardioversion. Now more consistent with atypical flutter -CHA2DS2/VAS Stroke Risk Points= 4  -no bleeding issues, continue apixaban -discussed options at length today. EF 45-50% on prior echo. Discussed antiarrhythmics available given his EF, discussed ablation, discussed rate control. He has a lot of concerns about medications, especially after discussing possible risk factors today. -his last echo did note severe LA dilation.  LA diameter 4.5 cm, volume 64 ml/m2 -he is very nervous. He wants to try to work on his stress before trialing medications given risk of side effects. We discussed at length today. We will check in soon and see how he is doing, consider referral to EP at that time if he is interested   CAD with history of prior stent: asymptomatic.  -stopped aspirin with start of apixaban -continue rosuvastatin 20 mg daily -LDL goal <70, see below re: patient preference -reviewed red flag warning signs that need immediate medical attention  Hypertension: Overall goal <130/80 -very elevated on home numbers, reviewed today. -continue irbesartan-HCTZ 150-12.5 mg daily. We discussed that taking this at night may exacerbate nocturia, but he feels this is manageable and current regimen is working well -trialed one dose of carvedilol, BP not well controlled, returned to labetalol -discussed at length, he will change to labetalol 300 mg TID as he tried this for several days, felt well, feels he can continue TID dosing long term -has  been on amlodipine in the past if additional agent needed in the future. Alternative would be to increase irbesartan-HCTZ dose to maximum dosing.  -overall, his preference is to make slow/gradual changes  He feels that stress has greatly affected his life. Was given ativan PRN by Dr. Alain Marion, has not trialed. He is thinking about trialing this. Discussed today.   Hypercholesterolemia: LDL goal <70 -reviewed lipids from 03/27/22 per [facility]. LDL 136, Tchol 199.  -we have discussed options for more intense management. Given his LDL on 20 mg rosuvastatin, suspect he will need PCSK9i to get to goal. He is happy with his current regimen and declines changes. Continue to address. -continue rosuvastatin 20 mg daily per patient preference  Cardiac risk counseling and prevention recommendations: -recommend heart healthy/Mediterranean diet, with whole grains, fruits, vegetable, fish, lean meats, nuts, and olive oil. Limit salt. -recommend moderate walking, 3-5 times/week for 30-50 minutes each session. Aim for at least 150 minutes.week. Goal should be pace of 3 miles/hours, or walking 1.5 miles in 30 minutes -recommend avoidance of tobacco products. Avoid excess alcohol.  Plan for follow up:   Total time of encounter: 21 minutes total time of encounter, including 47 minutes spent in face-to-face patient care. This time includes coordination of care and counseling regarding above conditions. Remainder of non-face-to-face time involved reviewing chart documents/testing relevant to the patient encounter and documentation in the medical record.  [name], MD, PhD, [facility]    Medication Adjustments/Labs and Tests Ordered: Current medicines are reviewed at length with the patient today.  Concerns regarding medicines are outlined above.   Orders Placed This Encounter  Procedures   EKG 12-Lead   Meds ordered this encounter  Medications   labetalol (NORMODYNE) 300 MG  tablet    Sig: Take 1 tablet (300 mg total) by mouth 3 (three) times daily.  Dispense:  270 tablet    Refill:  3   Patient Instructions  Medication Instructions:  INCREASE YOUR LABETALOL TO THREE TIMES A DAY   *If you need a refill on your cardiac medications before your next appointment, please call your pharmacy*  Lab Work: NONE  Testing/Procedures: NONE  Follow-Up: At [facility], you and your health needs are our priority.  As part of our continuing mission to provide you with exceptional heart care, we have created designated Provider Care Teams.  These Care Teams include your primary Cardiologist (physician) and Advanced Practice Providers (APPs -  Physician Assistants and Nurse Practitioners) who all work together to provide you with the care you need, when you need it.  We recommend signing up for the patient portal called "MyChart".  Sign up information is provided on this After Visit Summary.  MyChart is used to connect with patients for Virtual Visits (Telemedicine).  Patients are able to view lab/test results, encounter notes, upcoming appointments, etc.  Non-urgent messages can be sent to your provider as well.   To learn more about what you can do with MyChart, go to [email or web address].    Your next appointment:   6-8 WEEKS   The format for your next appointment:   In Person  Provider:   [name], MD       Signed, [name], MD PhD 02/05/2023    [place]

## 2023-02-24 ENCOUNTER — Telehealth (HOSPITAL_BASED_OUTPATIENT_CLINIC_OR_DEPARTMENT_OTHER): Payer: Self-pay | Admitting: Cardiology

## 2023-02-24 DIAGNOSIS — I484 Atypical atrial flutter: Secondary | ICD-10-CM

## 2023-02-24 DIAGNOSIS — I10 Essential (primary) hypertension: Secondary | ICD-10-CM

## 2023-02-24 DIAGNOSIS — Z79899 Other long term (current) drug therapy: Secondary | ICD-10-CM

## 2023-02-24 NOTE — Telephone Encounter (Signed)
Pt c/o swelling: STAT is pt has developed SOB within 24 hours  If swelling, where is the swelling located? Lower legs, ankles, & feet  How much weight have you gained and in what time span? 13 lbs in two months  Have you gained 3 pounds in a day or 5 pounds in a week? No  Do you have a log of your daily weights (if so, list)? No   Are you currently taking a fluid pill? No   Are you currently SOB? Feels he is always out of breath  Have you traveled recently? No   States BP has been running in the 170's-180's with his HR in the 90's. Reports swelling started 03/19 or 03/20 and is something new for him he's never had before.  Patient is wanting an appt to be seen regarding this.

## 2023-02-24 NOTE — Telephone Encounter (Signed)
Recommend Furosemide 20mg  QD until next OV. BMP/BNP In one week if not collected at sooner office visit. Appropriate for OV with Dr. Harrell Gave or APP.   To prevent or reduce lower extremity swelling: Eat a low salt diet. Salt makes the body hold onto extra fluid which causes swelling. Sit with legs elevated. For example, in the recliner or on an [place].  Wear knee-high compression stockings during the daytime. Ones labeled 15-20 mmHg provide good compression.   Loel Dubonnet, NP

## 2023-02-24 NOTE — Telephone Encounter (Signed)
Left message for patient to call back      "Recommend Furosemide 20mg  QD until next OV. BMP/BNP In one week if not collected at sooner office visit. Appropriate for OV with Dr. Harrell Gave or APP.    To prevent or reduce lower extremity swelling:  Eat a low salt diet. Salt makes the body hold onto extra fluid which causes swelling.  Sit with legs elevated. For example, in the recliner or on an [place].   Wear knee-high compression stockings during the daytime. Ones labeled 15-20 mmHg provide good compression.    Loel Dubonnet, NP"

## 2023-02-25 MED ORDER — FUROSEMIDE 20 MG PO TABS: 20.0000 mg | ORAL_TABLET | Freq: Every day | ORAL | 6 refills | Status: DC

## 2023-02-25 MED ORDER — DILTIAZEM HCL 30 MG PO TABS: ORAL_TABLET | ORAL | 1 refills | Status: DC

## 2023-02-25 NOTE — Addendum Note (Signed)
Addended by: Gerald Stabs on: 02/25/2023 11:15 AM   Modules accepted: Orders

## 2023-02-25 NOTE — Telephone Encounter (Signed)
Returned call to patient and provided the following recommendations, Rx sent to preferred pharmacy and verbal orders given to ref pt to EP for consultation for Ablation.    "Standing diltiazem previously caused bradycardia. Continue present dose Labetolol. Rx Diltiazem 30mg  PRN (may take up to BID) for heart rate persistently >90bpm.   Loel Dubonnet, NP "

## 2023-02-25 NOTE — Addendum Note (Signed)
Addended by: Gerald Stabs on: 02/25/2023 09:03 AM   Modules accepted: Orders

## 2023-02-25 NOTE — Telephone Encounter (Signed)
Returned call to patient to review the following recommendations. He states that Dr. Harrell Gave previously recommended an ablation and he wanted to try some other things first. He is agreeable to starting the lasix and doing repeat labs in one week. Patient scheduled for April 9th with Laurann Montana, NP.   Patient does also mention that his heart rate has been in the high 90s and is making him feel pretty poorly, was curious to know if there was anything he can take until we can get him set up for ablation?   Routing back to Laurann Montana, NP for review.     "Recommend Furosemide 20mg  QD until next OV. BMP/BNP In one week if not collected at sooner office visit. Appropriate for OV with Dr. Harrell Gave or APP.    To prevent or reduce lower extremity swelling:            Eat a low salt diet. Salt makes the body hold onto extra fluid which causes swelling.            Sit with legs elevated. For example, in the recliner or on an [place].             Wear knee-high compression stockings during the daytime. Ones labeled 15-20 mmHg provide good compression.    Andrew Dubonnet, NP"

## 2023-02-25 NOTE — Telephone Encounter (Signed)
Patient states that since we talked his blood pressure is much better 157/75, he says this is the best he has in [place]. But his pulse rate is 98.

## 2023-02-25 NOTE — Telephone Encounter (Signed)
Pt calling back

## 2023-02-25 NOTE — Telephone Encounter (Signed)
Standing diltiazem previously caused bradycardia. Continue present dose Labetolol. Rx Diltiazem 30mg  PRN (may take up to BID) for heart rate persistently >90bpm.  Loel Dubonnet, NP

## 2023-02-26 NOTE — Telephone Encounter (Signed)
Patient is calling back to speak to the nurse. Please advise

## 2023-02-26 NOTE — Telephone Encounter (Signed)
Returned call to patient who was confirming next appt. Date and time.  Pt would like to get his ablation set up as soon as possible.  He is feeling run down and not well.  RN assured him Urban Gibson would speak with him about his ablation at his appt. On 03/11/2023.  Instructions for Lasix and Cardizem reviewed with patient who verbalized understanding.  Georgana Curio MHA RN CCM

## 2023-03-03 ENCOUNTER — Telehealth: Payer: Self-pay | Admitting: Internal Medicine

## 2023-03-03 NOTE — Telephone Encounter (Signed)
Patient dropped off document Handicap Placard, to be filled out by provider. Patient requested to send it via Call Patient to pick up within ASAP. Document is located in providers tray at front office.Please advise at [facility] [phone]

## 2023-03-03 NOTE — Telephone Encounter (Signed)
Place form in MD purple folder to sign../lmb 

## 2023-03-05 NOTE — Telephone Encounter (Signed)
Done. Thank you.

## 2023-03-05 NOTE — Telephone Encounter (Signed)
Notified pt for,m is ready for pick/up.Marland KitchenJohny Joseph

## 2023-03-06 ENCOUNTER — Telehealth (HOSPITAL_BASED_OUTPATIENT_CLINIC_OR_DEPARTMENT_OTHER): Payer: Self-pay | Admitting: Cardiology

## 2023-03-06 NOTE — Telephone Encounter (Signed)
Patient stated someone recommended he wear compression socks for edema in his legs and ankles.  Patient wants to know if it is OK for him to wear the socks and can he go for walks.

## 2023-03-06 NOTE — Telephone Encounter (Signed)
Returned call to patient, he will have labs done tomorrow at [facility]. He is wearing his compression socks!      "Recommend wear to compression stockings. Okay to walk while wearing compression stockings. Wear during daytime only.    Reminder to have repeat labs as ordered when Lasix was changed. Can consider further medication changes based on lab work.    Dr.  Myles Gip will be able to have detailed discussion with him at time of OV 4/12 regarding ablation vs repeat cardioversion.    Loel Dubonnet, NP"

## 2023-03-06 NOTE — Telephone Encounter (Signed)
Recommend wear to compression stockings. Okay to walk while wearing compression stockings. Wear during daytime only.   Reminder to have repeat labs as ordered when Lasix was changed. Can consider further medication changes based on lab work.   Dr.  Myles Gip will be able to have detailed discussion with him at time of OV 4/12 regarding ablation vs repeat cardioversion.   Loel Dubonnet, NP

## 2023-03-06 NOTE — Telephone Encounter (Addendum)
Returned call to patient, answered patient questions about compression socks. He states that the lasix does really well at pulling fluid off in the morning, but by the evening his is still holding onto fluid. He states he has lost 12 pounds recently, congratulated him on dietary changes. Blood pressure has been pretty consistently been in the 160s/80s for the last little bit. His pulse has been maintained in the 80s. He is really excited about that! He is scheduled with Laurann Montana, NP on 4/9 and on 4/12 with Dr. Myles Gip for ablation consultation. He has questions about the best route versus repeating cardioversions vs ablations. He has typically liked the more conservative route in the past he mentions. Informed him I would send this over to [facility] to see if we need to increase fluid pill more before appointment and I would let him know!

## 2023-03-07 DIAGNOSIS — I484 Atypical atrial flutter: Secondary | ICD-10-CM | POA: Diagnosis not present

## 2023-03-07 DIAGNOSIS — I10 Essential (primary) hypertension: Secondary | ICD-10-CM | POA: Diagnosis not present

## 2023-03-07 DIAGNOSIS — Z79899 Other long term (current) drug therapy: Secondary | ICD-10-CM | POA: Diagnosis not present

## 2023-03-08 LAB — BASIC METABOLIC PANEL
BUN/Creatinine Ratio: 12 (ref 10–24)
BUN: 13 mg/dL (ref 8–27)
CO2: 23 mmol/L (ref 20–29)
Calcium: 9.8 mg/dL (ref 8.6–10.2)
Chloride: 100 mmol/L (ref 96–106)
Creatinine, Ser: 1.07 mg/dL (ref 0.76–1.27)
Glucose: 96 mg/dL (ref 70–99)
Potassium: 3.7 mmol/L (ref 3.5–5.2)
Sodium: 144 mmol/L (ref 134–144)
eGFR: 71 mL/min/{1.73_m2} (ref >59)

## 2023-03-08 LAB — BRAIN NATRIURETIC PEPTIDE: BNP: 413.6 pg/mL — ABNORMAL HIGH (ref 0.0–100.0)

## 2023-03-10 ENCOUNTER — Telehealth (HOSPITAL_BASED_OUTPATIENT_CLINIC_OR_DEPARTMENT_OTHER): Payer: Self-pay

## 2023-03-10 DIAGNOSIS — I10 Essential (primary) hypertension: Secondary | ICD-10-CM

## 2023-03-10 MED ORDER — POTASSIUM CHLORIDE CRYS ER 10 MEQ PO TBCR: 10.0000 meq | EXTENDED_RELEASE_TABLET | Freq: Two times a day (BID) | ORAL | 0 refills | Status: DC

## 2023-03-10 NOTE — Telephone Encounter (Addendum)
Called patient and reviewed the following results, patient is agreeable to changes. K+ sent to pharmacy for refill and he has follow up appointment tomorrow with Gillian Shields, NP     ----- Message from Alver Sorrow, NP sent at 03/09/2023  4:20 PM EDT ----- Normal kidney function and electrolytes.  BNP reveals volume overload.  Increase furosemide to 40 mg daily for 3 days.  May take two of his 20mg  tablets together. After 3 days, return to 20mg  daily.  Please inquire how he has been taking potassium.   If he is taking as prescribed 10 mill equivalent BID -  increase to 20 mEq (using two of his 10 mEq tabs) BID   If he is not taking he should resume at 10 mEq BID.

## 2023-03-11 ENCOUNTER — Encounter (HOSPITAL_BASED_OUTPATIENT_CLINIC_OR_DEPARTMENT_OTHER): Payer: Self-pay | Admitting: Family

## 2023-03-11 ENCOUNTER — Ambulatory Visit (HOSPITAL_BASED_OUTPATIENT_CLINIC_OR_DEPARTMENT_OTHER): Payer: Medicare PPO | Admitting: Family

## 2023-03-11 VITALS — BP 130/60 | HR 87 | Ht 68.0 in | Wt 170.0 lb

## 2023-03-11 DIAGNOSIS — I484 Atypical atrial flutter: Secondary | ICD-10-CM

## 2023-03-11 DIAGNOSIS — E785 Hyperlipidemia, unspecified: Secondary | ICD-10-CM

## 2023-03-11 DIAGNOSIS — R600 Localized edema: Secondary | ICD-10-CM

## 2023-03-11 DIAGNOSIS — I10 Essential (primary) hypertension: Secondary | ICD-10-CM | POA: Diagnosis not present

## 2023-03-11 DIAGNOSIS — I25118 Atherosclerotic heart disease of native coronary artery with other forms of angina pectoris: Secondary | ICD-10-CM

## 2023-03-11 MED ORDER — LABETALOL HCL 300 MG PO TABS: 300.0000 mg | ORAL_TABLET | Freq: Two times a day (BID) | ORAL | 3 refills | Status: DC

## 2023-03-11 NOTE — Patient Instructions (Signed)
Medication Instructions:  Your physician has recommended you make the following change in your medication:  CHANGE Labetolol to twice per day  *If you need a refill on your cardiac medications before your next appointment, please call your pharmacy*  Testing/Procedures: Your EKG today shows rate controlled atrial fibrillation.   Follow-Up: At [facility], you and your health needs are our priority.  As part of our continuing mission to provide you with exceptional heart care, we have created designated Provider Care Teams.  These Care Teams include your primary Cardiologist (physician) and Advanced Practice Providers (APPs -  Physician Assistants and Nurse Practitioners) who all work together to provide you with the care you need, when you need it.  We recommend signing up for the patient portal called "MyChart".  Sign up information is provided on this After Visit Summary.  MyChart is used to connect with patients for Virtual Visits (Telemedicine).  Patients are able to view lab/test results, encounter notes, upcoming appointments, etc.  Non-urgent messages can be sent to your provider as well.   To learn more about what you can do with MyChart, go to [email or web address].    Your next appointment:   As scheduled with Dr. Nelly Laurence and Dr. Cristal Deer  Other Instructions  Please bring your blood pressure cuff to your next office visit.   Dr. Nelly Laurence will discuss antiarrhythmic medications versus ablation for management of atrial flutter. There are some medications that help the heart to stay in regular rhythm - two that may be safe for you are Amiodarone (Pacerone) or Dofetilide (Tikosyn). He will also discuss ablation - Alver Sorrow, NP will reach out to ask if this procedure requires an overnight stay or iodine contrast.   To prevent or reduce lower extremity swelling: Eat a low salt diet. Salt makes the body hold onto extra fluid which causes swelling. Sit with legs  elevated. For example, in the recliner or on an ottoman.  Wear knee-high compression stockings during the daytime. Ones labeled 15-20 mmHg provide good compression.   Recommend weighing daily and keeping a log. Please call our office if you have weight gain of 2 pounds overnight or 5 pounds in 1 week.   Date  Time Weight

## 2023-03-11 NOTE — Progress Notes (Signed)
Office Visit    Patient Name: Andrew Joseph. Date of Encounter: 03/11/2023  PCP:  Tresa Garter, Andrew   [facility]  Cardiologist:  Jodelle Red, Andrew  Advanced Practice Provider:  No care team member to display Electrophysiologist:  None      Chief Complaint    Andrew Joseph. is a 77 y.o. male presents today for lower extremity edema   Past Medical History    Past Medical History:  Diagnosis Date   Allergic rhinitis, cause unspecified    Allergy    SEASONAL   Anxiety    Blood in stool    BPH (benign prostatic hyperplasia)    CAD (coronary artery disease)    DES mid lad 08/15/2009   Cancer 2009   prostate   Chest pain, unspecified    Colon polyps    CVA (cerebrovascular accident due to intracerebral hemorrhage) 2007 [place]   History of prostate cancer 2009   Dr Annabell Howells   HTN (hypertension)    Hypercholesterolemia    Hyperlipidemia    Hypokalemia    Insomnia, persistent    Neoplasm of uncertain behavior of skin    Stroke    2007   Syncope    Tubular adenoma of colon 2010   Past Surgical History:  Procedure Laterality Date   ANGIOPLASTY     with stents   CARDIOVERSION N/A 04/13/2021   Procedure: CARDIOVERSION;  Surgeon: Chrystie Nose, Andrew;  Location: [facility] ENDOSCOPY;  Service: Cardiovascular;  Laterality: N/A;   CARDIOVERSION N/A 01/21/2023   Procedure: CARDIOVERSION;  Surgeon: Meriam Sprague, Andrew;  Location: [facility] ENDOSCOPY;  Service: Cardiovascular;  Laterality: N/A;   COLONOSCOPY  2016   Stark, TA's   POLYPECTOMY     PROSTATECTOMY  2009   TONSILLECTOMY      Allergies  Allergies  Allergen Reactions   Amlodipine Swelling   Iodine     Affected my heart some how   Shellfish Allergy Rash    Turns red    History of Present Illness    Andrew Smola. is a 77 y.o. male with a hx of CAD s/p remote stent 2012 to distal LAD, paroxysmal atrial flutter s/p cardioversion, HTN, HLD last seen  02/04/23.  Underwent cardioversion 01/21/23 and converted to NSR but on follow up 02/04/23 had returned to atrial flutter. BP was markedly elevated and Labetolol adjusted to 300mg  TID. Irbesartan-HCTZ continued at 150-12.5mg  QD. Noted that he previously tolerated Amlodipine well if needed.   On 02/25/23 he was recommended Furosemide 20mg  QD due to LE edema and provided Diltiazem 30mg  PRN for heart rate persistently 90bpm after contacted the office noting tachycardia. He was also referred to EP to discuss ablation.   03/10/23 was recommended to increase Furosemide to 40mg  daily for 3 days then return to 20 mEq daily.   Presents today for follow up. Weight down 14 pounds since last clinic visit. Notes he does feel better with weight decreasing from Lasix. He does follow a careful low sugar diet. He is not bothered by urinary frequency. Drinking about 20 oz of water per day - has been mindful of drinking a bit less water to prevent fluid retention. Notes swelling was previously in lower legs and feet but has improved to just mild edema in right foot. No orthopnea, PND. Lives independently and eats out predominantly. Notes he is most often taking Labetolol twice per day as misses the afternoon dose with SBP at  home ranging 120s-170s on wrist cuff which has not been checked for accuracy.  We spent a significant portion of our visit discussing rhythm control strategies for atrial fibs/flutter such as AAD versus ablation.  EKGs/Labs/Other Studies Reviewed:   The following studies were reviewed today: Cardiac Studies & Procedures     STRESS TESTS  MYOCARDIAL PERFUSION IMAGING 11/08/2015   ECHOCARDIOGRAM  ECHOCARDIOGRAM COMPLETE 04/11/2021  Narrative ECHOCARDIOGRAM REPORT    Patient Name:   Andrew Joseph. Date of Exam: 04/11/2021 Medical Rec #:  [id number]           Height:       68.0 in Accession #:    [id number]          Weight:       172.8 lb Date of Birth:  1946/08/01           BSA:          1.921  m Patient Age:    74 years            BP:           152/81 mmHg Patient Gender: M                   HR:           65 bpm. Exam Location:  [address]  Procedure: 2D Echo, 3D Echo, Cardiac Doppler and Color Doppler  Indications:    I48.4 Atypical Atrial Flutter  History:        Patient has prior history of Echocardiogram examinations, most recent 08/19/2014. CAD and Previous Myocardial Infarction, Stroke, Arrythmias:Atrial Fibrillation and Atrial Flutter, Signs/Symptoms:Chest Pain and Syncope; Risk Factors:Family History of Coronary Artery Disease, Hypertension and Dyslipidemia.  Sonographer:    Farrel Conners RDCS Referring Phys: [id number] BRIDGETTE CHRISTOPHER  IMPRESSIONS   1. Left ventricular ejection fraction, by estimation, is 45 to 50%. The left ventricle has mildly decreased function. The left ventricle demonstrates global hypokinesis. Left ventricular diastolic parameters were normal. 2. Right ventricular systolic function is normal. The right ventricular size is mildly enlarged. There is normal pulmonary artery systolic pressure. 3. Left atrial size was severely dilated. 4. Right atrial size was moderately dilated. 5. The mitral valve is degenerative. No evidence of mitral valve regurgitation. No evidence of mitral stenosis. 6. The aortic valve is calcified. There is mild calcification of the aortic valve. There is mild thickening of the aortic valve. Aortic valve regurgitation is mild. Mild to moderate aortic valve sclerosis/calcification is present, without any evidence of aortic stenosis. Aortic regurgitation PHT measures 351 msec. 7. The inferior vena cava is normal in size with greater than 50% respiratory variability, suggesting right atrial pressure of 3 mmHg.  FINDINGS Left Ventricle: Left ventricular ejection fraction, by estimation, is 45 to 50%. The left ventricle has mildly decreased function. The left ventricle demonstrates global hypokinesis. 3D left  ventricular ejection fraction analysis performed but not reported based on interpreter judgement due to suboptimal quality. The left ventricular internal cavity size was normal in size. There is no left ventricular hypertrophy. Left ventricular diastolic parameters were normal.  Right Ventricle: The right ventricular size is mildly enlarged. No increase in right ventricular wall thickness. Right ventricular systolic function is normal. There is normal pulmonary artery systolic pressure. The tricuspid regurgitant velocity is 2.58 m/s, and with an assumed right atrial pressure of 3 mmHg, the estimated right ventricular systolic pressure is 29.6 mmHg.  Left Atrium: Left atrial size was severely dilated.  Right  Atrium: Right atrial size was moderately dilated.  Pericardium: There is no evidence of pericardial effusion.  Mitral Valve: The mitral valve is degenerative in appearance. There is mild thickening of the mitral valve leaflet(s). There is mild calcification of the mitral valve leaflet(s). Mild mitral annular calcification. No evidence of mitral valve regurgitation. No evidence of mitral valve stenosis.  Tricuspid Valve: The tricuspid valve is normal in structure. Tricuspid valve regurgitation is mild . No evidence of tricuspid stenosis.  Aortic Valve: The aortic valve is calcified. There is mild calcification of the aortic valve. There is mild thickening of the aortic valve. Aortic valve regurgitation is mild. Aortic regurgitation PHT measures 351 msec. Mild to moderate aortic valve sclerosis/calcification is present, without any evidence of aortic stenosis.  Pulmonic Valve: The pulmonic valve was normal in structure. Pulmonic valve regurgitation is trivial. No evidence of pulmonic stenosis.  Aorta: The aortic root is normal in size and structure.  Venous: The inferior vena cava is normal in size with greater than 50% respiratory variability, suggesting right atrial pressure of 3  mmHg.  IAS/Shunts: No atrial level shunt detected by color flow Doppler.   LEFT VENTRICLE PLAX 2D LVIDd:         5.05 cm  Diastology LVIDs:         3.90 cm  LV e' medial:    7.18 cm/s LV PW:         1.05 cm  LV E/e' medial:  10.8 LV IVS:        0.75 cm  LV e' lateral:   12.40 cm/s LVOT diam:     2.00 cm  LV E/e' lateral: 6.3 LV SV:         73 LV SV Index:   38 LVOT Area:     3.14 cm  3D Volume EF: 3D EF:        55 % LV EDV:       163 ml LV ESV:       73 ml LV SV:        90 ml  RIGHT VENTRICLE RV S prime:     13.60 cm/s TAPSE (M-mode): 2.0 cm  LEFT ATRIUM              Index       RIGHT ATRIUM           Index LA diam:        4.50 cm  2.34 cm/m  RA Area:     28.00 cm LA Vol (A2C):   124.0 ml 64.56 ml/m RA Volume:   95.10 ml  49.51 ml/m LA Vol (A4C):   106.0 ml 55.19 ml/m LA Biplane Vol: 118.0 ml 61.44 ml/m AORTIC VALVE LVOT Vmax:   110.50 cm/s LVOT Vmean:  69.300 cm/s LVOT VTI:    0.232 m AI PHT:      351 msec  AORTA Ao Root diam: 3.10 cm Ao Asc diam:  3.20 cm  MITRAL VALVE               TRICUSPID VALVE MV Area (PHT): cm         TR Peak grad:   26.6 mmHg MV Decel Time: 293 msec    TR Vmax:        258.00 cm/s MV E velocity: 77.75 cm/s MV A velocity: 38.60 cm/s  SHUNTS MV E/A ratio:  2.01        Systemic VTI:  0.23 m Systemic Diam: 2.00 cm  [employer]  Andrew Electronically signed by Donato SchultzMark Skains Andrew Signature Date/Time: 04/11/2021/11:20:41 AM    Final              EKG:  EKG is  ordered today.  The ekg ordered today demonstrates rate controlled atrial fibrillation 87bpm with no acute ST/T wave changes.  Recent Labs: 09/30/2022: ALT 18; TSH 2.06 01/15/2023: Hemoglobin 13.6; Platelets 114 03/07/2023: BNP 413.6; BUN 13; Creatinine, Ser 1.07; Potassium 3.7; Sodium 144  Recent Lipid Panel    Component Value Date/Time   CHOL 191 09/30/2022 1015   TRIG 85.0 09/30/2022 1015   HDL 47.00 09/30/2022 1015   CHOLHDL 4 09/30/2022 1015   VLDL 17.0 09/30/2022 1015    LDLCALC 127 (H) 09/30/2022 1015   LDLDIRECT 296.1 08/12/2013 0936    Risk Assessment/Calculations:   CHA2DS2-VASc Score = 4   This indicates a 4.8% annual risk of stroke. The patient's score is based upon: CHF History: 0 HTN History: 1 Diabetes History: 0 Stroke History: 0 Vascular Disease History: 1 Age Score: 2 Gender Score: 0     Home Medications   Current Meds  Medication Sig   apixaban (ELIQUIS) 5 MG TABS tablet TAKE 1 TABLET BY MOUTH TWICE DAILY   diltiazem (CARDIZEM) 30 MG tablet Take up to twice daily for heart rates greater than 90bpm.   furosemide (LASIX) 20 MG tablet Take 1 tablet (20 mg total) by mouth daily.   irbesartan-hydrochlorothiazide (AVALIDE) 150-12.5 MG tablet TAKE 1 TABLET BY MOUTH DAILY   labetalol (NORMODYNE) 300 MG tablet Take 1 tablet (300 mg total) by mouth 3 (three) times daily.   potassium chloride (KLOR-CON M) 10 MEQ tablet Take 1 tablet (10 mEq total) by mouth 2 (two) times daily for 3 days.   rosuvastatin (CRESTOR) 20 MG tablet Take 1 tablet (20 mg total) by mouth daily.   triamcinolone cream (KENALOG) 0.5 % APPLY TOPICALLY 3 TIMES DAILY AS NEEDED (IRRITATION)   zolpidem (AMBIEN) 10 MG tablet Take 1 tablet (10 mg total) by mouth at bedtime.     Review of Systems      All other systems reviewed and are otherwise negative except as noted above.  Physical Exam    VS:  BP 130/60   Pulse 87   Ht 5\' 8"  (1.727 m)   Wt 170 lb (77.1 kg)   BMI 25.85 kg/m  , BMI Body mass index is 25.85 kg/m.  Wt Readings from Last 3 Encounters:  03/11/23 170 lb (77.1 kg)  02/04/23 184 lb 14.4 oz (83.9 kg)  01/21/23 178 lb (80.7 kg)     GEN: Well nourished, well developed, in no acute distress. HEENT: normal. Neck: Supple, no JVD, carotid bruits, or masses. Cardiac: IRIR, no murmurs, rubs, or gallops. No clubbing, cyanosis. Trace R pedal edema..  Radials/PT 2+ and equal bilaterally.  Respiratory:  Respirations regular and unlabored, clear to auscultation  bilaterally. GI: Soft, nontender, nondistended. MS: No deformity or atrophy. Skin: Warm and dry, no rash. Neuro:  Strength and sensation are intact. Psych: Normal affect.  Assessment & Plan    LE edema - Weight down 14 lbs via clinic scale since increased dose of Lasix. Anticipate element of diastolic dysfunction due to atrial fibrillation. Continue Lasix 40mg  daily for 3 days and return to 20mg  daily thereafter as discussed via previous phone encounter.   Atypical atrial flutter - EKG today reveals atrial fibrillation.  Prior cardioversion 04/13/2021.  He had recurrent atrial flutter requiring cardioversion 01/21/2023 with quick return of atrial flutter/fibrillation. Symptomatic  with activity intolerance. Has upcoming OV to establish with EP Dr. Nelly Laurence. Briefly reviewed AAD such as Amiodarone or Dofetilide as well as ablation. He does have concern about potential use of contrast given iodine allergy and previously bad experience - reassurance provided regarding premedication for iodine contrast if needed.  Once rhythm control obtained would benefit from referral to prep exercise program at the [facility] for improvement in stamina.   HTN - BP at goal in clinic but elevated at home when checking via wrist cuff. He will bring wrist cuff to next OV to assess accuracy. Continue present antihypertensive regimen  - will change Labetolol on eMAR to 300mg  BID as notes often missing midday dose. Anticipate previously elevated readings due to volume overload. Continue irbesartan-hydrochlorothiazide 150-12.5 mg daily.  Previously tolerated amlodipine could be used in the future if needed.  CAD / HLD, LDL goal <70-  Remote DES -LAD. Stable with no anginal symptoms. No indication for ischemic evaluation.  Has previously declined further titration of lipid lowering therapy with [id number]. Continue to discuss at future office visit.         Disposition: Follow up  as scheduled  with Dr. Nelly Laurence and Jodelle Red,  Andrew or APP.  Signed, Alver Sorrow, NP 03/11/2023, 11:04 AM [facility]

## 2023-03-14 ENCOUNTER — Ambulatory Visit: Payer: Medicare PPO | Attending: Cardiovascular Disease | Admitting: Cardiovascular Disease

## 2023-03-14 ENCOUNTER — Encounter: Payer: Self-pay | Admitting: Cardiovascular Disease

## 2023-03-14 VITALS — BP 130/78 | HR 86 | Ht 68.0 in | Wt 171.2 lb

## 2023-03-14 DIAGNOSIS — I4892 Unspecified atrial flutter: Secondary | ICD-10-CM | POA: Diagnosis not present

## 2023-03-14 DIAGNOSIS — I4819 Other persistent atrial fibrillation: Secondary | ICD-10-CM

## 2023-03-14 DIAGNOSIS — I4891 Unspecified atrial fibrillation: Secondary | ICD-10-CM | POA: Insufficient documentation

## 2023-03-14 MED ORDER — AMIODARONE HCL 200 MG PO TABS
ORAL_TABLET | ORAL | 3 refills | Status: DC
Start: 2023-03-14 — End: 2023-04-03

## 2023-03-14 NOTE — H&P (View-Only) (Signed)
Electrophysiology Office Note:    Date:  03/14/2023   ID:  Andrew G Duhe Jr., DOB 01/23/1946, MRN [id number]  PCP:  [name], Aleksei V, MD   [facility] Providers Cardiologist:  Bridgette Christopher, MD     Referring MD: Walker, Caitlin S, NP   History of Present Illness:    Andrew G Eagen Jr. is a 76 y.o. male with a hx listed below, significant for coronary disease status post PCI, questionable stroke in 2007, hypertension, hyperlipidemia, atrial flutter, referred for arrhythmia management.  He reports that he has had paroxysms of rapid heart rates for years, frequently waking him at night.  He developed persistent atrial flutter and had a DC cardioversion in May 2022.  He did fine for a few years but then had recurrence of atrial flutter. He had a second cardioversion for atrial flutter on January 21, 2023.  He was noted to have early recurrence of flutter and follow-up on March 5.  He notes that he felt better after cardioversion with improved fatigue and shortness of breath.  He notes that he has been less active with atrial flutter.  He has been followed in general cardiology clinic for heart failure symptoms and hypertension.  Today, his ECG shows atrial fibrillation.    Past Medical History:  Diagnosis Date   Allergic rhinitis, cause unspecified    Allergy    SEASONAL   Anxiety    Blood in stool    BPH (benign prostatic hyperplasia)    CAD (coronary artery disease)    DES mid lad 08/15/2009   Cancer 2009   prostate   Chest pain, unspecified    Colon polyps    CVA (cerebrovascular accident due to intracerebral hemorrhage) 2007 [place]   History of prostate cancer 2009   Dr Wrenn   HTN (hypertension)    Hypercholesterolemia    Hyperlipidemia    Hypokalemia    Insomnia, persistent    Neoplasm of uncertain behavior of skin    Stroke    2007   Syncope    Tubular adenoma of colon 2010    Past Surgical History:  Procedure Laterality Date    ANGIOPLASTY     with stents   CARDIOVERSION N/A 04/13/2021   Procedure: CARDIOVERSION;  Surgeon: Hilty, Kenneth C, MD;  Location: [facility];  Service: Cardiovascular;  Laterality: N/A;   CARDIOVERSION N/A 01/21/2023   Procedure: CARDIOVERSION;  Surgeon: Pemberton, Heather E, MD;  Location: [facility]OS[facility];  Service: Cardiovascular;  Laterality: N/A;   COLONOSCOPY  2016   Stark, TA's   POLYPECTOMY     PROSTATECTOMY  2009   TONSILLECTOMY      Current Medications: Current Meds  Medication Sig   apixaban (ELIQUIS) 5 MG TABS tablet TAKE 1 TABLET BY MOUTH TWICE DAILY   diltiazem (CARDIZEM) 30 MG tablet Take up to twice daily for heart rates greater than 90bpm.   furosemide (LASIX) 20 MG tablet Take 1 tablet (20 mg total) by mouth daily.   irbesartan-hydrochlorothiazide (AVALIDE) 150-12.5 MG tablet TAKE 1 TABLET BY MOUTH DAILY   labetalol (NORMODYNE) 300 MG tablet Take 1 tablet (300 mg total) by mouth 2 (two) times daily.   rosuvastatin (CRESTOR) 20 MG tablet Take 1 tablet (20 mg total) by mouth daily.   triamcinolone cream (KENALOG) 0.5 % APPLY TOPICALLY 3 TIMES DAILY AS NEEDED (IRRITATION)   zolpidem (AMBIEN) 10 MG tablet Take 1 tablet (10 mg total) by mouth at bedtime.     Allergies:   Amlodipine, Iodine,   and Shellfish allergy   Social and Family History: Reviewed in Epic  ROS:   Please see the history of present illness.    All other systems reviewed and are negative.  EKGs/Labs/Other Studies Reviewed Today:    Echocardiogram:  04/11/2021  1. Left ventricular ejection fraction, by estimation, is 45 to 50%. The  left ventricle has mildly decreased function. The left ventricle  demonstrates global hypokinesis. Left ventricular diastolic parameters  were normal.   2. Right ventricular systolic function is normal. The right ventricular  size is mildly enlarged. There is normal pulmonary artery systolic  pressure.   3. Left atrial size was severely dilated.   4. Right atrial size  was moderately dilated.   5. The mitral valve is degenerative. No evidence of mitral valve  regurgitation. No evidence of mitral stenosis.   6. The aortic valve is calcified. There is mild calcification of the  aortic valve. There is mild thickening of the aortic valve. Aortic valve  regurgitation is mild. Mild to moderate aortic valve  sclerosis/calcification is present, without any evidence  of aortic stenosis. Aortic regurgitation PHT measures 351 msec.   7. The inferior vena cava is normal in size with greater than 50%  respiratory variability, suggesting right atrial pressure of 3 mmHg.    Monitors:   Stress testing:   Advanced imaging:    EKG:  Last EKG results: today - atrial fibrillation, prolonged QT 01/03/2023 ECG --   Recent Labs: 09/30/2022: ALT 18; TSH 2.06 01/15/2023: Hemoglobin 13.6; Platelets 114 03/07/2023: BNP 413.6; BUN 13; Creatinine, Ser 1.07; Potassium 3.7; Sodium 144     Physical Exam:    VS:  BP 130/78   Pulse 86   Ht 5' 8" (1.727 m)   Wt 171 lb 3.2 oz (77.7 kg)   SpO2 95%   BMI 26.03 kg/m     Wt Readings from Last 3 Encounters:  03/14/23 171 lb 3.2 oz (77.7 kg)  03/11/23 170 lb (77.1 kg)  02/04/23 184 lb 14.4 oz (83.9 kg)     GEN:  Well nourished, well developed in no acute distress CARDIAC: iRRR, no murmurs, rubs, gallops RESPIRATORY:  Normal work of breathing MUSCULOSKELETAL: no edema    ASSESSMENT & PLAN:    Atrial flutter Will perform CTI ablation at the time of atrial fibrillation ablation  Atrial fibrillation Symptomatic, persistent We discussed management options Will schedule for ablation -- right now scheduling in September Will start amiodarone, plan DCCV in 2 weeks. Check TSH and hepatic panel Continue anticoagulation TEE on the table at the time of AF ablation. No CT due to contrast allergy  We discussed the indication, rationale, logistics, anticipated benefits, and potential risks of the ablation procedure including  but not limited to -- bleed at the groin access site, chest pain, damage to nearby organs such as the diaphragm, lungs, or esophagus, need for a drainage tube, or prolonged hospitalization. I explained that the risk for stroke, heart attack, need for open chest surgery, or even death is very low but not zero. he  expressed understanding and wishes to proceed.   Coronary vascular disease On labetalol, eliquis  History of contrast allergy TEE rather than CT for pre-op eval          Medication Adjustments/Labs and Tests Ordered: Current medicines are reviewed at length with the patient today.  Concerns regarding medicines are outlined above.  Orders Placed This Encounter  Procedures   EKG 12-Lead   No orders of the defined   types were placed in this encounter.    Signed, Elvina Bosch E Haily Caley, MD  03/14/2023 12:20 PM    [facility] 

## 2023-03-14 NOTE — Patient Instructions (Addendum)
Medication Instructions:  START Amiodarone - 1 tablet by mouth twice daily for 14 days, then DECREASE to 1 tablet daily  *If you need a refill on your cardiac medications before your next appointment, please call your pharmacy*    Testing/Procedures: Cardioversion Your physician has recommended that you have a Cardioversion (DCCV). Electrical Cardioversion uses a jolt of electricity to your heart either through paddles or wired patches attached to your chest. This is a controlled, usually prescheduled, procedure. Defibrillation is done under light anesthesia in the hospital, and you usually go home the day of the procedure. This is done to get your heart back into a normal rhythm. You are not awake for the procedure. Please see the instruction sheet given to you today.  Ablation Your physician has recommended that you have an ablation. Catheter ablation is a medical procedure used to treat some cardiac arrhythmias (irregular heartbeats). During catheter ablation, a long, thin, flexible tube is put into a blood vessel in your groin (upper thigh), or neck. This tube is called an ablation catheter. It is then guided to your heart through the blood vessel. Radio frequency waves destroy small areas of heart tissue where abnormal heartbeats may cause an arrhythmia to start. Please see the instruction sheet given to you today. You are scheduled for Atrial Fibrillation Ablation on Thursday, September 5 with Dr. Halford Chessman.Please arrive at the Main Entrance A at [facility]: [address] [place], [place] [address] at 8:30 AM     Follow-Up: At [facility], you and your health needs are our priority.  As part of our continuing mission to provide you with exceptional heart care, we have created designated Provider Care Teams.  These Care Teams include your primary Cardiologist (physician) and Advanced Practice Providers (APPs -  Physician Assistants and Nurse Practitioners) who all work  together to provide you with the care you need, when you need it.  We recommend signing up for the patient portal called "MyChart".  Sign up information is provided on this After Visit Summary.  MyChart is used to connect with patients for Virtual Visits (Telemedicine).  Patients are able to view lab/test results, encounter notes, upcoming appointments, etc.  Non-urgent messages can be sent to your provider as well.   To learn more about what you can do with MyChart, go to [email or web address].    Your next appointment:   August 16th at 10:30am  Provider:   York Pellant, MD

## 2023-03-14 NOTE — Progress Notes (Signed)
Electrophysiology Office Note:    Date:  03/14/2023   ID:  Andrew Buffalo., DOB [birth year], MRN [id number]  PCP:  Tresa Garter, MD   [facility] Providers Cardiologist:  Jodelle Red, MD     Referring MD: Alver Sorrow, NP   History of Present Illness:    Andrew Joseph. is a 77 y.o. male with a hx listed below, significant for coronary disease status post PCI, questionable stroke in 2007, hypertension, hyperlipidemia, atrial flutter, referred for arrhythmia management.  He reports that he has had paroxysms of rapid heart rates for years, frequently waking him at night.  He developed persistent atrial flutter and had a DC cardioversion in May 2022.  He did fine for a few years but then had recurrence of atrial flutter. He had a second cardioversion for atrial flutter on January 21, 2023.  He was noted to have early recurrence of flutter and follow-up on March 5.  He notes that he felt better after cardioversion with improved fatigue and shortness of breath.  He notes that he has been less active with atrial flutter.  He has been followed in general cardiology clinic for heart failure symptoms and hypertension.  Today, his ECG shows atrial fibrillation.    Past Medical History:  Diagnosis Date   Allergic rhinitis, cause unspecified    Allergy    SEASONAL   Anxiety    Blood in stool    BPH (benign prostatic hyperplasia)    CAD (coronary artery disease)    DES mid lad 08/15/2009   Cancer 2009   prostate   Chest pain, unspecified    Colon polyps    CVA (cerebrovascular accident due to intracerebral hemorrhage) 2007 [place]   History of prostate cancer 2009   Dr Annabell Howells   HTN (hypertension)    Hypercholesterolemia    Hyperlipidemia    Hypokalemia    Insomnia, persistent    Neoplasm of uncertain behavior of skin    Stroke    2007   Syncope    Tubular adenoma of colon 2010    Past Surgical History:  Procedure Laterality Date    ANGIOPLASTY     with stents   CARDIOVERSION N/A 04/13/2021   Procedure: CARDIOVERSION;  Surgeon: Chrystie Nose, MD;  Location: [facility];  Service: Cardiovascular;  Laterality: N/A;   CARDIOVERSION N/A 01/21/2023   Procedure: CARDIOVERSION;  Surgeon: Meriam Sprague, MD;  Location: [facility] ENDOSCOPY;  Service: Cardiovascular;  Laterality: N/A;   COLONOSCOPY  2016   Stark, TA's   POLYPECTOMY     PROSTATECTOMY  2009   TONSILLECTOMY      Current Medications: Current Meds  Medication Sig   apixaban (ELIQUIS) 5 MG TABS tablet TAKE 1 TABLET BY MOUTH TWICE DAILY   diltiazem (CARDIZEM) 30 MG tablet Take up to twice daily for heart rates greater than 90bpm.   furosemide (LASIX) 20 MG tablet Take 1 tablet (20 mg total) by mouth daily.   irbesartan-hydrochlorothiazide (AVALIDE) 150-12.5 MG tablet TAKE 1 TABLET BY MOUTH DAILY   labetalol (NORMODYNE) 300 MG tablet Take 1 tablet (300 mg total) by mouth 2 (two) times daily.   rosuvastatin (CRESTOR) 20 MG tablet Take 1 tablet (20 mg total) by mouth daily.   triamcinolone cream (KENALOG) 0.5 % APPLY TOPICALLY 3 TIMES DAILY AS NEEDED (IRRITATION)   zolpidem (AMBIEN) 10 MG tablet Take 1 tablet (10 mg total) by mouth at bedtime.     Allergies:   Amlodipine, Iodine,  and Shellfish allergy   Social and Family History: Reviewed in Epic  ROS:   Please see the history of present illness.    All other systems reviewed and are negative.  EKGs/Labs/Other Studies Reviewed Today:    Echocardiogram:  04/11/2021  1. Left ventricular ejection fraction, by estimation, is 45 to 50%. The  left ventricle has mildly decreased function. The left ventricle  demonstrates global hypokinesis. Left ventricular diastolic parameters  were normal.   2. Right ventricular systolic function is normal. The right ventricular  size is mildly enlarged. There is normal pulmonary artery systolic  pressure.   3. Left atrial size was severely dilated.   4. Right atrial size  was moderately dilated.   5. The mitral valve is degenerative. No evidence of mitral valve  regurgitation. No evidence of mitral stenosis.   6. The aortic valve is calcified. There is mild calcification of the  aortic valve. There is mild thickening of the aortic valve. Aortic valve  regurgitation is mild. Mild to moderate aortic valve  sclerosis/calcification is present, without any evidence  of aortic stenosis. Aortic regurgitation PHT measures 351 msec.   7. The inferior vena cava is normal in size with greater than 50%  respiratory variability, suggesting right atrial pressure of 3 mmHg.    Monitors:   Stress testing:   Advanced imaging:    EKG:  Last EKG results: today - atrial fibrillation, prolonged QT 01/03/2023 ECG --   Recent Labs: 09/30/2022: ALT 18; TSH 2.06 01/15/2023: Hemoglobin 13.6; Platelets 114 03/07/2023: BNP 413.6; BUN 13; Creatinine, Ser 1.07; Potassium 3.7; Sodium 144     Physical Exam:    VS:  BP 130/78   Pulse 86   Ht 5\' 8"  (1.727 m)   Wt 171 lb 3.2 oz (77.7 kg)   SpO2 95%   BMI 26.03 kg/m     Wt Readings from Last 3 Encounters:  03/14/23 171 lb 3.2 oz (77.7 kg)  03/11/23 170 lb (77.1 kg)  02/04/23 184 lb 14.4 oz (83.9 kg)     GEN:  Well nourished, well developed in no acute distress CARDIAC: iRRR, no murmurs, rubs, gallops RESPIRATORY:  Normal work of breathing MUSCULOSKELETAL: no edema    ASSESSMENT & PLAN:    Atrial flutter Will perform CTI ablation at the time of atrial fibrillation ablation  Atrial fibrillation Symptomatic, persistent We discussed management options Will schedule for ablation -- right now scheduling in September Will start amiodarone, plan DCCV in 2 weeks. Check TSH and hepatic panel Continue anticoagulation TEE on the table at the time of AF ablation. No CT due to contrast allergy  We discussed the indication, rationale, logistics, anticipated benefits, and potential risks of the ablation procedure including  but not limited to -- bleed at the groin access site, chest pain, damage to nearby organs such as the diaphragm, lungs, or esophagus, need for a drainage tube, or prolonged hospitalization. I explained that the risk for stroke, heart attack, need for open chest surgery, or even death is very low but not zero. he  expressed understanding and wishes to proceed.   Coronary vascular disease On labetalol, eliquis  History of contrast allergy TEE rather than CT for pre-op eval          Medication Adjustments/Labs and Tests Ordered: Current medicines are reviewed at length with the patient today.  Concerns regarding medicines are outlined above.  Orders Placed This Encounter  Procedures   EKG 12-Lead   No orders of the defined  types were placed in this encounter.    Signed, Maurice Small, MD  03/14/2023 12:20 PM    [facility]

## 2023-03-17 ENCOUNTER — Other Ambulatory Visit: Payer: Self-pay

## 2023-03-17 DIAGNOSIS — Z01810 Encounter for preprocedural cardiovascular examination: Secondary | ICD-10-CM

## 2023-03-17 DIAGNOSIS — I4892 Unspecified atrial flutter: Secondary | ICD-10-CM

## 2023-03-27 ENCOUNTER — Other Ambulatory Visit: Payer: Self-pay | Admitting: Internal Medicine

## 2023-03-27 NOTE — Progress Notes (Signed)
Pt for CV at 1030 tomorrow, please arrive at hospital by 0930, npo after midnight, except sips of water in AM with meds, pt states he is taking Eliquis as prescribed, informed to have transportation post procedure and someone to stay with pt for approximately 24 hours

## 2023-03-28 ENCOUNTER — Encounter (HOSPITAL_COMMUNITY)
Admission: RE | Disposition: A | Discharge status: Home / Self Care | Payer: Self-pay | Source: Ambulatory Visit | Attending: Cardiovascular Disease

## 2023-03-28 ENCOUNTER — Other Ambulatory Visit: Payer: Self-pay

## 2023-03-28 ENCOUNTER — Encounter (HOSPITAL_COMMUNITY): Payer: Self-pay | Admitting: Cardiovascular Disease

## 2023-03-28 ENCOUNTER — Ambulatory Visit (HOSPITAL_COMMUNITY): Payer: Medicare PPO | Admitting: Anesthesiology

## 2023-03-28 ENCOUNTER — Ambulatory Visit (HOSPITAL_COMMUNITY)
Admission: RE | Admit: 2023-03-28 | Discharge: 2023-03-28 | Disposition: A | Discharge status: Home / Self Care | Payer: Medicare PPO | Source: Ambulatory Visit | Attending: Cardiovascular Disease | Admitting: Cardiovascular Disease

## 2023-03-28 ENCOUNTER — Ambulatory Visit (HOSPITAL_BASED_OUTPATIENT_CLINIC_OR_DEPARTMENT_OTHER): Payer: Medicare PPO | Admitting: Anesthesiology

## 2023-03-28 DIAGNOSIS — I2510 Atherosclerotic heart disease of native coronary artery without angina pectoris: Secondary | ICD-10-CM

## 2023-03-28 DIAGNOSIS — I4892 Unspecified atrial flutter: Secondary | ICD-10-CM | POA: Diagnosis not present

## 2023-03-28 DIAGNOSIS — E785 Hyperlipidemia, unspecified: Secondary | ICD-10-CM | POA: Diagnosis not present

## 2023-03-28 DIAGNOSIS — I10 Essential (primary) hypertension: Secondary | ICD-10-CM | POA: Insufficient documentation

## 2023-03-28 DIAGNOSIS — Z7901 Long term (current) use of anticoagulants: Secondary | ICD-10-CM | POA: Insufficient documentation

## 2023-03-28 DIAGNOSIS — Z955 Presence of coronary angioplasty implant and graft: Secondary | ICD-10-CM | POA: Diagnosis not present

## 2023-03-28 DIAGNOSIS — Z79899 Other long term (current) drug therapy: Secondary | ICD-10-CM | POA: Diagnosis not present

## 2023-03-28 DIAGNOSIS — Z888 Allergy status to other drugs, medicaments and biological substances status: Secondary | ICD-10-CM | POA: Insufficient documentation

## 2023-03-28 DIAGNOSIS — Z8673 Personal history of transient ischemic attack (TIA), and cerebral infarction without residual deficits: Secondary | ICD-10-CM | POA: Diagnosis not present

## 2023-03-28 DIAGNOSIS — I4891 Unspecified atrial fibrillation: Secondary | ICD-10-CM

## 2023-03-28 HISTORY — PX: CARDIOVERSION: SHX1299

## 2023-03-28 LAB — POCT I-STAT, CHEM 8
BUN: 12 mg/dL (ref 8–23)
Calcium, Ion: 1.22 mmol/L (ref 1.15–1.40)
Chloride: 101 mmol/L (ref 98–111)
Creatinine, Ser: 1.1 mg/dL (ref 0.61–1.24)
Glucose, Bld: 108 mg/dL — ABNORMAL HIGH (ref 70–99)
HCT: 42 % (ref 39.0–52.0)
Hemoglobin: 14.3 g/dL (ref 13.0–17.0)
Potassium: 3.1 mmol/L — ABNORMAL LOW (ref 3.5–5.1)
Sodium: 141 mmol/L (ref 135–145)
TCO2: 25 mmol/L (ref 22–32)

## 2023-03-28 SURGERY — CARDIOVERSION
Anesthesia: General

## 2023-03-28 MED ORDER — SODIUM CHLORIDE 0.9 % IV SOLN: INTRAVENOUS | Status: DC

## 2023-03-28 MED ORDER — SODIUM CHLORIDE 0.9 % IV SOLN: INTRAVENOUS | Status: DC | PRN

## 2023-03-28 MED ORDER — LIDOCAINE 2% (20 MG/ML) 5 ML SYRINGE
INTRAMUSCULAR | Status: DC | PRN
  Administered 2023-03-28: 60 mg via INTRAVENOUS

## 2023-03-28 MED ORDER — PROPOFOL 10 MG/ML IV BOLUS
INTRAVENOUS | Status: DC | PRN
  Administered 2023-03-28 (×3): 20 mg via INTRAVENOUS
  Administered 2023-03-28: 40 mg via INTRAVENOUS

## 2023-03-28 SURGICAL SUPPLY — 1 items: ELECT DEFIB PAD ADLT CADENCE (PAD) ×1 IMPLANT

## 2023-03-28 NOTE — Interval H&P Note (Signed)
History and Physical Interval Note:  03/28/2023 9:29 AM  Andrew Joseph.  has presented today for surgery, with the diagnosis of AFIB.  The various methods of treatment have been discussed with the patient and family. After consideration of risks, benefits and other options for treatment, the patient has consented to  Procedure(s): CARDIOVERSION (N/A) as a surgical intervention.  The patient's history has been reviewed, patient examined, no change in status, stable for surgery.  I have reviewed the patient's chart and labs.  Questions were answered to the patient's satisfaction.     [name]

## 2023-03-28 NOTE — Anesthesia Procedure Notes (Signed)
Procedure Name: General with mask airway Date/Time: 03/28/2023 9:52 AM  Performed by: Adria Dill, CRNAPre-anesthesia Checklist: Patient identified, Emergency Drugs available, Suction available and Patient being monitored Patient Re-evaluated:Patient Re-evaluated prior to induction Oxygen Delivery Method: Nasal cannula Preoxygenation: Pre-oxygenation with 100% oxygen Induction Type: IV induction Placement Confirmation: positive ETCO2 and breath sounds checked- equal and bilateral Dental Injury: Teeth and Oropharynx as per pre-operative assessment

## 2023-03-28 NOTE — Anesthesia Preprocedure Evaluation (Signed)
Anesthesia Evaluation  Patient identified by MRN, date of birth, ID band Patient awake    Reviewed: Allergy & Precautions, H&P , NPO status , Patient's Chart, lab work & pertinent test results  Airway Mallampati: II  TM Distance: >3 FB Neck ROM: Full    Dental no notable dental hx.    Pulmonary neg pulmonary ROS   Pulmonary exam normal breath sounds clear to auscultation       Cardiovascular hypertension, + CAD and + Cardiac Stents  Normal cardiovascular exam+ dysrhythmias Atrial Fibrillation  Rhythm:Irregular Rate:Normal     Neuro/Psych CVA  negative psych ROS   GI/Hepatic negative GI ROS, Neg liver ROS,,,  Endo/Other  negative endocrine ROS    Renal/GU negative Renal ROS  negative genitourinary   Musculoskeletal negative musculoskeletal ROS (+)    Abdominal   Peds negative pediatric ROS (+)  Hematology negative hematology ROS (+)   Anesthesia Other Findings   Reproductive/Obstetrics negative OB ROS                             Anesthesia Physical Anesthesia Plan  ASA: 3  Anesthesia Plan: General   Post-op Pain Management: Minimal or no pain anticipated   Induction: Intravenous  PONV Risk Score and Plan: 2 and Treatment may vary due to age or medical condition  Airway Management Planned: Mask  Additional Equipment:   Intra-op Plan:   Post-operative Plan:   Informed Consent: I have reviewed the patients History and Physical, chart, labs and discussed the procedure including the risks, benefits and alternatives for the proposed anesthesia with the patient or authorized representative who has indicated his/her understanding and acceptance.     Dental advisory given  Plan Discussed with: CRNA and Surgeon  Anesthesia Plan Comments:        Anesthesia Quick Evaluation

## 2023-03-28 NOTE — Anesthesia Postprocedure Evaluation (Signed)
Anesthesia Post Note  Patient: Andrew Joseph.  Procedure(s) Performed: CARDIOVERSION     Patient location during evaluation: PACU Anesthesia Type: General Level of consciousness: awake and alert Pain management: pain level controlled Vital Signs Assessment: post-procedure vital signs reviewed and stable Respiratory status: spontaneous breathing, nonlabored ventilation, respiratory function stable and patient connected to nasal cannula oxygen Cardiovascular status: blood pressure returned to baseline and stable Postop Assessment: no apparent nausea or vomiting Anesthetic complications: no  No notable events documented.  Last Vitals:  Vitals:   03/28/23 0921  BP: (!) 174/94  Pulse: 87  Resp: 15  Temp: 36.7 C  SpO2: 97%    Last Pain:  Vitals:   03/28/23 0921  TempSrc: Temporal  PainSc: 0-No pain                 Juline Sanderford S

## 2023-03-28 NOTE — Transfer of Care (Signed)
Immediate Anesthesia Transfer of Care Note  Patient: Andrew Joseph.  Procedure(s) Performed: CARDIOVERSION  Patient Location: PACU  Anesthesia Type:General  Level of Consciousness: drowsy and patient cooperative  Airway & Oxygen Therapy: Patient Spontanous Breathing and Patient connected to nasal cannula oxygen  Post-op Assessment: Report given to RN and Post -op Vital signs reviewed and stable  Post vital signs: Reviewed and stable  Last Vitals:  Vitals Value Taken Time  BP    Temp    Pulse    Resp    SpO2      Last Pain:  Vitals:   03/28/23 0921  TempSrc: Temporal  PainSc: 0-No pain         Complications: No notable events documented.

## 2023-03-28 NOTE — Discharge Instructions (Signed)

## 2023-03-28 NOTE — CV Procedure (Signed)
[facility] Anesthesia Propofol On Rx xarelto no missed doses  DCC x 2 150 J then 200 J biphasic  Converted from afib rate 98 to NSR rate 72 bpm  No immediate neurologic sequelae  [name] MD [facility]

## 2023-03-31 ENCOUNTER — Other Ambulatory Visit (INDEPENDENT_AMBULATORY_CARE_PROVIDER_SITE_OTHER): Payer: Medicare PPO

## 2023-03-31 DIAGNOSIS — E785 Hyperlipidemia, unspecified: Secondary | ICD-10-CM

## 2023-03-31 DIAGNOSIS — I10 Essential (primary) hypertension: Secondary | ICD-10-CM | POA: Diagnosis not present

## 2023-03-31 DIAGNOSIS — Z8546 Personal history of malignant neoplasm of prostate: Secondary | ICD-10-CM | POA: Diagnosis not present

## 2023-03-31 LAB — COMPREHENSIVE METABOLIC PANEL
ALT: 40 U/L (ref 0–53)
AST: 32 U/L (ref 0–37)
Albumin: 4 g/dL (ref 3.5–5.2)
Alkaline Phosphatase: 81 U/L (ref 39–117)
BUN: 15 mg/dL (ref 6–23)
CO2: 25 mEq/L (ref 19–32)
Calcium: 9 mg/dL (ref 8.4–10.5)
Chloride: 104 mEq/L (ref 96–112)
Creatinine, Ser: 1.06 mg/dL (ref 0.40–1.50)
GFR: 67.49 mL/min (ref >60.00)
Glucose, Bld: 102 mg/dL — ABNORMAL HIGH (ref 70–99)
Potassium: 3.2 mEq/L — ABNORMAL LOW (ref 3.5–5.1)
Sodium: 138 mEq/L (ref 135–145)
Total Bilirubin: 0.8 mg/dL (ref 0.2–1.2)
Total Protein: 6.2 g/dL (ref 6.0–8.3)

## 2023-03-31 LAB — CBC WITH DIFFERENTIAL/PLATELET
Basophils Absolute: 0.1 10*3/uL (ref 0.0–0.1)
Basophils Relative: 1 % (ref 0.0–3.0)
Eosinophils Absolute: 0.7 10*3/uL (ref 0.0–0.7)
Eosinophils Relative: 11.4 % — ABNORMAL HIGH (ref 0.0–5.0)
HCT: 42.1 % (ref 39.0–52.0)
Hemoglobin: 14.2 g/dL (ref 13.0–17.0)
Lymphocytes Relative: 15.9 % (ref 12.0–46.0)
Lymphs Abs: 0.9 10*3/uL (ref 0.7–4.0)
MCHC: 33.8 g/dL (ref 30.0–36.0)
MCV: 86.1 fl (ref 78.0–100.0)
Monocytes Absolute: 0.5 10*3/uL (ref 0.1–1.0)
Monocytes Relative: 8.8 % (ref 3.0–12.0)
Neutro Abs: 3.6 10*3/uL (ref 1.4–7.7)
Neutrophils Relative %: 62.9 % (ref 43.0–77.0)
Platelets: 105 10*3/uL — ABNORMAL LOW (ref 150.0–400.0)
RBC: 4.88 Mil/uL (ref 4.22–5.81)
RDW: 17.4 % — ABNORMAL HIGH (ref 11.5–15.5)
WBC: 5.7 10*3/uL (ref 4.0–10.5)

## 2023-03-31 LAB — URINALYSIS
Bilirubin Urine: NEGATIVE
Hgb urine dipstick: NEGATIVE
Ketones, ur: NEGATIVE
Leukocytes,Ua: NEGATIVE
Nitrite: NEGATIVE
Specific Gravity, Urine: 1.01 (ref 1.000–1.030)
Total Protein, Urine: NEGATIVE
Urine Glucose: NEGATIVE
Urobilinogen, UA: 0.2 (ref 0.0–1.0)
pH: 6.5 (ref 5.0–8.0)

## 2023-03-31 LAB — LIPID PANEL
Cholesterol: 164 mg/dL (ref 0–200)
HDL: 42.4 mg/dL (ref >39.00)
LDL Cholesterol: 112 mg/dL — ABNORMAL HIGH (ref 0–99)
NonHDL: 121.63
Total CHOL/HDL Ratio: 4
Triglycerides: 49 mg/dL (ref 0.0–149.0)
VLDL: 9.8 mg/dL (ref 0.0–40.0)

## 2023-03-31 LAB — TSH: TSH: 4.73 u[IU]/mL (ref 0.35–5.50)

## 2023-03-31 LAB — PSA: PSA: 0 ng/mL — ABNORMAL LOW (ref 0.10–4.00)

## 2023-04-01 ENCOUNTER — Ambulatory Visit: Payer: Medicare PPO | Admitting: Internal Medicine

## 2023-04-01 ENCOUNTER — Encounter: Payer: Self-pay | Admitting: Internal Medicine

## 2023-04-01 VITALS — BP 122/70 | HR 75 | Temp 98.3°F | Ht 68.0 in | Wt 175.0 lb

## 2023-04-01 DIAGNOSIS — E785 Hyperlipidemia, unspecified: Secondary | ICD-10-CM | POA: Diagnosis not present

## 2023-04-01 DIAGNOSIS — B079 Viral wart, unspecified: Secondary | ICD-10-CM | POA: Diagnosis not present

## 2023-04-01 DIAGNOSIS — I2510 Atherosclerotic heart disease of native coronary artery without angina pectoris: Secondary | ICD-10-CM | POA: Diagnosis not present

## 2023-04-01 DIAGNOSIS — G8929 Other chronic pain: Secondary | ICD-10-CM | POA: Diagnosis not present

## 2023-04-01 DIAGNOSIS — M25511 Pain in right shoulder: Secondary | ICD-10-CM | POA: Diagnosis not present

## 2023-04-01 DIAGNOSIS — I4892 Unspecified atrial flutter: Secondary | ICD-10-CM | POA: Diagnosis not present

## 2023-04-01 DIAGNOSIS — D485 Neoplasm of uncertain behavior of skin: Secondary | ICD-10-CM

## 2023-04-01 DIAGNOSIS — E876 Hypokalemia: Secondary | ICD-10-CM

## 2023-04-01 DIAGNOSIS — L84 Corns and callosities: Secondary | ICD-10-CM | POA: Diagnosis not present

## 2023-04-01 MED ORDER — POTASSIUM CHLORIDE CRYS ER 20 MEQ PO TBCR
20.0000 meq | EXTENDED_RELEASE_TABLET | Freq: Every day | ORAL | 3 refills | Status: DC
Start: 2023-04-01 — End: 2023-04-03

## 2023-04-01 NOTE — Assessment & Plan Note (Addendum)
Abdomen - r/o cancer Skin bx on Friday w/me

## 2023-04-01 NOTE — Assessment & Plan Note (Signed)
No angina 

## 2023-04-01 NOTE — Assessment & Plan Note (Signed)
?  rotator cuff tear after a fall ROM exercises Blue-Emu cream was recommended to use 2-3 times a day Sport med ref was ordered

## 2023-04-01 NOTE — Patient Instructions (Signed)
Blue-Emu cream -- use 2-3 times a day ? ?

## 2023-04-01 NOTE — Progress Notes (Signed)
Subjective:  Patient ID: Andrew Joseph., male    DOB: May 26, 1946  Age: 77 y.o. MRN: [id number]  CC: Follow-up (6 mnth f.u, skin tag removal)   HPI Andrew Joseph. presents for A fib, HTN, CAD He just had a cardioversion - feeling better C/o R shoulder pain C/o skin lesion on abdomen  Outpatient Medications Prior to Visit  Medication Sig Dispense Refill   amiodarone (PACERONE) 200 MG tablet Take 1 tablet by mouth twice daily for 14 days, then DECREASE to 1 tablet daily 104 tablet 3   apixaban (ELIQUIS) 5 MG TABS tablet TAKE 1 TABLET BY MOUTH TWICE DAILY 180 tablet 2   diltiazem (CARDIZEM) 30 MG tablet Take up to twice daily for heart rates greater than 90bpm. 60 tablet 1   furosemide (LASIX) 20 MG tablet Take 1 tablet (20 mg total) by mouth daily. 30 tablet 6   irbesartan-hydrochlorothiazide (AVALIDE) 150-12.5 MG tablet TAKE 1 TABLET BY MOUTH DAILY 90 tablet 3   labetalol (NORMODYNE) 300 MG tablet Take 1 tablet (300 mg total) by mouth 2 (two) times daily. 180 tablet 3   rosuvastatin (CRESTOR) 20 MG tablet Take 1 tablet (20 mg total) by mouth daily. 90 tablet 1   triamcinolone cream (KENALOG) 0.5 % APPLY TOPICALLY 3 TIMES DAILY AS NEEDED (IRRITATION) 60 g 1   zolpidem (AMBIEN) 10 MG tablet TAKE 1 TABLET BY MOUTH AT BEDTIME 90 tablet 1   No facility-administered medications prior to visit.    ROS: Review of Systems  Constitutional:  Negative for appetite change, fatigue and unexpected weight change.  HENT:  Negative for congestion, nosebleeds, sneezing, sore throat and trouble swallowing.   Eyes:  Negative for itching and visual disturbance.  Respiratory:  Negative for cough.   Cardiovascular:  Negative for chest pain, palpitations and leg swelling.  Gastrointestinal:  Negative for abdominal distention, blood in stool, diarrhea and nausea.  Genitourinary:  Negative for frequency and hematuria.  Musculoskeletal:  Negative for back pain, gait problem, joint swelling and neck  pain.  Skin:  Negative for rash.  Neurological:  Negative for dizziness, tremors, speech difficulty and weakness.  Psychiatric/Behavioral:  Positive for sleep disturbance. Negative for agitation, dysphoric mood and suicidal ideas. The patient is nervous/anxious.     Objective:  BP 122/70 (BP Location: Right Arm, Patient Position: Sitting, Cuff Size: Normal)   Pulse 75   Temp 98.3 F (36.8 C) (Oral)   Ht 5\' 8"  (1.727 m)   Wt 175 lb (79.4 kg)   SpO2 96%   BMI 26.61 kg/m   BP Readings from Last 3 Encounters:  04/01/23 122/70  03/28/23 (!) 156/71  03/14/23 130/78    Wt Readings from Last 3 Encounters:  04/01/23 175 lb (79.4 kg)  03/28/23 171 lb (77.6 kg)  03/14/23 171 lb 3.2 oz (77.7 kg)    Physical Exam Constitutional:      General: He is not in acute distress.    Appearance: Normal appearance. He is well-developed.     Comments: NAD  Eyes:     Conjunctiva/sclera: Conjunctivae normal.     Pupils: Pupils are equal, round, and reactive to light.  Neck:     Thyroid: No thyromegaly.     Vascular: No JVD.  Cardiovascular:     Rate and Rhythm: Normal rate and regular rhythm.     Heart sounds: Normal heart sounds. No murmur heard.    No friction rub. No gallop.  Pulmonary:     Effort: Pulmonary  effort is normal. No respiratory distress.     Breath sounds: Normal breath sounds. No wheezing or rales.  Chest:     Chest wall: No tenderness.  Abdominal:     General: Bowel sounds are normal. There is no distension.     Palpations: Abdomen is soft. There is no mass.     Tenderness: There is no abdominal tenderness. There is no guarding or rebound.  Musculoskeletal:        General: No tenderness. Normal range of motion.     Cervical back: Normal range of motion.  Lymphadenopathy:     Cervical: No cervical adenopathy.  Skin:    General: Skin is warm and dry.     Findings: No rash.  Neurological:     Mental Status: He is alert and oriented to person, place, and time.      Cranial Nerves: No cranial nerve deficit.     Motor: No abnormal muscle tone.     Coordination: Coordination normal.     Gait: Gait normal.     Deep Tendon Reflexes: Reflexes are normal and symmetric.  Psychiatric:        Behavior: Behavior normal.        Thought Content: Thought content normal.        Judgment: Judgment normal.   L  foot callus 4th digit  11 mm skin lesion on abdomen L shoulder wart R shoulder is very tender w/decreased ROM  Procedure:  L  foot callus 4th digit paring/cutting  Indication:     L  foot callus 4th digit , painful  Consent: verbal  Risks and benefits were explained to the patient. Skin was cleaned with alcohol. I removed a large callus carefully with a round blade. Skin remained intact. Pain is better. Tolerated well. Complications: none. Bandaid applied   Procedure Note :     Procedure : Cryosurgery   Indication:  Wart(s)    Risks including unsuccessful procedure , bleeding, infection, bruising, scar, a need for a repeat  procedure and others were explained to the patient in detail as well as the benefits. Informed consent was obtained verbally.   1  lesion(s)  on  L shoulder  was/were treated with liquid nitrogen on a Q-tip in a usual fasion . Band-Aid was applied and antibiotic ointment was given for a later use.   Tolerated well. Complications none.   Postprocedure instructions :     Keep the wounds clean. You can wash them with liquid soap and water. Pat dry with gauze or a Kleenex tissue  Before applying antibiotic ointment and a Band-Aid.   You need to report immediately  if  any signs of infection develop.     A total time of 45 minutes was spent preparing to see the patient, reviewing tests, x-rays, operative reports and other medical records (excluding procedures).  Also, obtaining history and performing comprehensive physical exam.  Additionally, counseling the patient regarding the above listed issues.   Finally, documenting clinical  information in the health records, coordination of care, educating the patient.   Lab Results  Component Value Date   WBC 5.7 03/31/2023   HGB 14.2 03/31/2023   HCT 42.1 03/31/2023   PLT 105.0 (L) 03/31/2023   GLUCOSE 102 (H) 03/31/2023   CHOL 164 03/31/2023   TRIG 49.0 03/31/2023   HDL 42.40 03/31/2023   LDLDIRECT 296.1 08/12/2013   LDLCALC 112 (H) 03/31/2023   ALT 40 03/31/2023   AST 32 03/31/2023   NA  138 03/31/2023   K 3.2 (L) 03/31/2023   CL 104 03/31/2023   CREATININE 1.06 03/31/2023   BUN 15 03/31/2023   CO2 25 03/31/2023   TSH 4.73 03/31/2023   PSA 0.00 (L) 03/31/2023   INR 0.99 07/31/2011   HGBA1C 5.8 03/09/2014    EP STUDY  Result Date: 03/28/2023 See surgical note for result.   Assessment & Plan:   Problem List Items Addressed This Visit     NEOPLASM, SKIN, UNCERTAIN BEHAVIOR    Abdomen - r/o cancer Skin bx on Friday w/me      Dyslipidemia    F/u w/Dr Cristal Deer Cont on Vytorin      Coronary artery disease involving native coronary artery of native heart without angina pectoris    No angina      Hypokalemia    Due to furosemide use Start KCl w/Furosemide      Atrial flutter (HCC) - Primary    F/u w/Dr Cristal Deer He just had a cardioversion - feeling better Ablation is planned      Callus    See procedure      Wart    See cryo      Shoulder pain, right    ?rotator cuff tear after a fall ROM exercises Blue-Emu cream was recommended to use 2-3 times a day Sport med ref was ordered         Meds ordered this encounter  Medications   potassium chloride SA (KLOR-CON M) 20 MEQ tablet    Sig: Take 1 tablet (20 mEq total) by mouth daily.    Dispense:  90 tablet    Refill:  3      Follow-up: Return in about 3 months (around 07/01/2023) for a follow-up visit.  Sonda Primes, MD

## 2023-04-01 NOTE — Assessment & Plan Note (Signed)
F/u w/Dr Cristal Deer Cont on Vytorin

## 2023-04-01 NOTE — Assessment & Plan Note (Signed)
Due to furosemide use Start KCl w/Furosemide

## 2023-04-01 NOTE — Assessment & Plan Note (Signed)
See procedure 

## 2023-04-01 NOTE — Assessment & Plan Note (Signed)
See cryo 

## 2023-04-01 NOTE — Assessment & Plan Note (Addendum)
F/u w/Dr Cristal Deer He just had a cardioversion - feeling better Ablation is planned

## 2023-04-02 NOTE — Progress Notes (Signed)
Cardiology Office Note:    Date:  04/03/2023   ID:  Andrew Buffalo., DOB Apr 22, 1946, MRN [id number]  PCP:  Tresa Garter, MD  Cardiologist:  Jodelle Red, MD  Referring MD: Tresa Garter, MD   CC: follow up  History of Present Illness:    Andrew Joseph. is a 77 y.o. male with a hx of CAD with remote stent, paroxysmal atrial flutter s/p cardioversion who is seen for follow up. I initially met him 09/07/2019 as a new consult at the request of [name], Georgina Quint, MD for the evaluation and management of CAD.  CV history: distal LAD stent, last cath 05/03/11 Father died of MI age 21. Hypercholesterolemia: prior LDL 124. Was told in the past his total cholesterol was over 400. Was happy that his total cholesterol is less than 200. Had cardioversion on 01/21/23 with Dr. Shari Prows. Converted with 1 shock to sinus with PACs.   Cardiovascular risk factors: Prior clinical ASCVD: CAD with PCI to LAD. ?CVA in 05-02-2006 (quarter sized bleed in his brain--lightheadedness/dizziness), unclear ?seizure event 05-03-2011 Comorbid conditions: hypertension, hyperlipidemia Prior cardiac testing: cath 2010, 2012. Echo 05-02-14. MPI 1997, 200006/01/20022003-06-01  Reviewed options for management of arrhythmia. He established care with EP Dr. Nelly Laurence 03/14/23. He was scheduled for ablation in September. In the interim he was started on amiodarone and planned for DCCV in 2 weeks.On 4/26 he underwent DCC x 2 150 J then 200 J biphasic and converted from afib rate 98 to NSR rate 72 bpm. He saw his PCP 4/30 and was feeling better.  Today, he is maintaining sinus rhythm and feeling good. He does not believe he has reverted to atrial fibrillation since his cardioversion. No concerning symptoms. Doesn't feel like he is flat or tired. He is able to take a deep breath whenever he wants.   He wished to discuss if he needed to continue taking Lasix. At this time he denies issues with swelling. Recently he is down 13 lbs  then regained 3, currently 176 lbs in clinic. We discussed trying Lasix as needed.  In clinic his BP is elevated to 184/70 (148/66 on manual recheck). However, he reports a recent blood pressure of 122/70 at his PCP's office.  He has a collision injury to the top of his left hand that has been healing for 2 weeks. He denies any significant bleeding issues on Eliquis. Tomorrow morning he will undergo a biopsy of a skin lesion of his epigastric region.  Regarding his diet, he doesn't eat a lot of meat. Usually he has salmon or chicken, and sweet potatoes. Tries to avoid tea and sugar.   He denies any palpitations, chest pain, shortness of breath, lightheadedness, headaches, syncope, orthopnea, or PND.   Past Medical History:  Diagnosis Date   Allergic rhinitis, cause unspecified    Allergy    SEASONAL   Anxiety    Blood in stool    BPH (benign prostatic hyperplasia)    CAD (coronary artery disease)    DES mid lad 08/15/2009   Cancer (HCC) 02-May-2008   prostate   Chest pain, unspecified    Colon polyps    CVA (cerebrovascular accident due to intracerebral hemorrhage) [facility]) 2007 Spring   History of prostate cancer 05-02-2008   Dr Annabell Howells   HTN (hypertension)    Hypercholesterolemia    Hyperlipidemia    Hypokalemia    Insomnia, persistent    Neoplasm of uncertain behavior of skin    Stroke (  HCC)    2007   Syncope    Tubular adenoma of colon 2010    Past Surgical History:  Procedure Laterality Date   ANGIOPLASTY     with stents   CARDIOVERSION N/A 04/13/2021   Procedure: CARDIOVERSION;  Surgeon: Chrystie Nose, MD;  Location: [facility] ENDOSCOPY;  Service: Cardiovascular;  Laterality: N/A;   CARDIOVERSION N/A 01/21/2023   Procedure: CARDIOVERSION;  Surgeon: Meriam Sprague, MD;  Location: [facility] ENDOSCOPY;  Service: Cardiovascular;  Laterality: N/A;   CARDIOVERSION N/A 03/28/2023   Procedure: CARDIOVERSION;  Surgeon: Wendall Stade, MD;  Location: [facility] INVASIVE CV LAB;  Service: Cardiovascular;   Laterality: N/A;   COLONOSCOPY  2016   Russella Dar, TA's   POLYPECTOMY     PROSTATECTOMY  2009   TONSILLECTOMY      Current Medications: Current Outpatient Medications on File Prior to Visit  Medication Sig   amiodarone (PACERONE) 200 MG tablet Take 200 mg by mouth daily.   apixaban (ELIQUIS) 5 MG TABS tablet TAKE 1 TABLET BY MOUTH TWICE DAILY   diltiazem (CARDIZEM) 30 MG tablet Take up to twice daily for heart rates greater than 90bpm.   irbesartan-hydrochlorothiazide (AVALIDE) 150-12.5 MG tablet TAKE 1 TABLET BY MOUTH DAILY   labetalol (NORMODYNE) 300 MG tablet Take 1 tablet (300 mg total) by mouth 2 (two) times daily.   rosuvastatin (CRESTOR) 20 MG tablet Take 1 tablet (20 mg total) by mouth daily.   triamcinolone cream (KENALOG) 0.5 % APPLY TOPICALLY 3 TIMES DAILY AS NEEDED (IRRITATION)   zolpidem (AMBIEN) 10 MG tablet TAKE 1 TABLET BY MOUTH AT BEDTIME   No current facility-administered medications on file prior to visit.     Allergies:   Amlodipine, Iodine, and Shellfish allergy   Social History   Tobacco Use   Smoking status: Never   Smokeless tobacco: Never  Vaping Use   Vaping Use: Never used  Substance Use Topics   Alcohol use: Yes    Comment: glass of wine- occ  per pt - occ mixed drink    Drug use: No    Family History: family history includes Diabetes in his brother; Heart disease in his father; Stroke in his mother. There is no history of Colon polyps, Esophageal cancer, Rectal cancer, Stomach cancer, or Colon cancer. father died age 52 from MI, 3 ppd smoker, overweight. Mother died age 90 of stroke. Brother has diabetes. 3 children, all heathly  ROS:   Please see the history of present illness.   Additional pertinent ROS otherwise unremarkable.   EKGs/Labs/Other Studies Reviewed:    The following studies were reviewed today:  Echo 04/11/2021: 1. Left ventricular ejection fraction, by estimation, is 45 to 50%. The  left ventricle has mildly decreased  function. The left ventricle  demonstrates global hypokinesis. Left ventricular diastolic parameters  were normal.   2. Right ventricular systolic function is normal. The right ventricular  size is mildly enlarged. There is normal pulmonary artery systolic  pressure.   3. Left atrial size was severely dilated.   4. Right atrial size was moderately dilated.   5. The mitral valve is degenerative. No evidence of mitral valve  regurgitation. No evidence of mitral stenosis.   6. The aortic valve is calcified. There is mild calcification of the  aortic valve. There is mild thickening of the aortic valve. Aortic valve  regurgitation is mild. Mild to moderate aortic valve  sclerosis/calcification is present, without any evidence  of aortic stenosis. Aortic regurgitation PHT measures 351  msec.   7. The inferior vena cava is normal in size with greater than 50%  respiratory variability, suggesting right atrial pressure of 3 mmHg.   US Venous Right LE 06/16/2020: IMPRESSION: No evidence of DVT within the right lower extremity.  Cath 07/31/2011 Left main coronary artery was a very short segment.  There was near side-by-side separate ostia.  No significant disease.  The proximal LAD had 30% to 40% tubular disease.  The mid LAD had 50% multiple discrete lesions.  This was unchanged from the previous catheterization done in 2010.  There was a widely patent stent in the distal LAD.  The LAD was a large artery that wrapped the apex.   The LAD actually supplied most of the PDA territory, so it was difficult to know what dominance the patient was.  Right coronary artery was small and had no significant disease.   The circumflex coronary artery was somewhat larger than right coronary artery, but again the PDA was mostly supplied by the distal LAD.  There was 20% to 30% ostial lesions proximally.  The ostium of the first obtuse marginal branch had a 40% lesion.   The patient also had a very large diagonal  branch, which did not have significant disease and provided most of the anterolateral wall.   Left ventriculography.  Left ventriculography was normal.  EF was 60%. There was no gradient across the aortic valve and no MR.  Aortic pressure was 132/71.  LV pressure was somewhat inaccurate with the LV tracing at 160/11.   IMPRESSION:  The patient has continued moderate disease in the mid LAD after the diagonal takeoff.  I closely reviewed his previous film from 2010 and there has been no change in this disease.  His stent is widely patent and he does not have any new disease in the right coronary artery or circ.  Clinically when I talked to the patient it sounded like he had a seizure.  There is no antecedent chest pain, palpitations, or cardiac symptoms.   The patient was fairly insistent on having a diagnostic cath rather than a stress test.   I have encouraged the patient to follow up with Neurology.  We have already arranged for him to have an EEG, MRI with MRA, and neurological followup.   Given the patency of the stent, normal LV function would be highly unlikely that his "seizure event was cardiac in etiology."  At the end of the case, a TR band was placed with good hemostasis.  He tolerated the procedure well.  Cath done by Dr. Juanda Chance. During the procedure, he had a contrast reaction to the dye (reported prior iodine/shellfish allergy), was treated for this. Received PCI to LAD with stenosis form 95% to 0%.  EKG:  EKG is personally reviewed.   04/03/2023:  sinus bradycardia at 53 bpm with PAC 02/04/23: atypical atrial flutter/afib at 91 bpm 01/03/2023:  atypical atrial flutter/afib 07/15/2022:  sinus bradycardia at 53 bpm, LAFB 10/08/2021: EKG was not ordered. 05/11/2021: sinus bradycardia, 54 bpm 09/22/20: sinus bradycardia, LAFB at 58 bpm  Recent Labs: 03/07/2023: BNP 413.6 03/31/2023: ALT 40; BUN 15; Creatinine, Ser 1.06; Hemoglobin 14.2; Platelets 105.0; Potassium 3.2; Sodium 138;  TSH 4.73   Recent Lipid Panel    Component Value Date/Time   CHOL 164 03/31/2023 1020   TRIG 49.0 03/31/2023 1020   HDL 42.40 03/31/2023 1020   CHOLHDL 4 03/31/2023 1020   VLDL 9.8 03/31/2023 1020   LDLCALC 112 (H) 03/31/2023 1020  LDLDIRECT 296.1 08/12/2013 0936    Physical Exam:    VS:  BP (!) 148/66 (BP Location: Left Arm, Patient Position: Sitting, Cuff Size: Normal)   Pulse (!) 53   Ht 5\' 8"  (1.727 m)   Wt 176 lb 12.8 oz (80.2 kg)   BMI 26.88 kg/m    Wt Readings from Last 3 Encounters:  04/03/23 176 lb 12.8 oz (80.2 kg)  04/01/23 175 lb (79.4 kg)  03/28/23 171 lb (77.6 kg)    GEN: Well nourished, well developed in no acute distress HEENT: Normal, moist mucous membranes NECK: No JVD CARDIAC: Regular rate and rhythm, normal S1 and S2, no rubs or gallops. No murmur. VASCULAR: Radial and DP pulses 2+ bilaterally. No carotid bruits RESPIRATORY:  Clear to auscultation without rales, wheezing or rhonchi  ABDOMEN: Soft, non-tender, non-distended MUSCULOSKELETAL:  Ambulates independently SKIN: Warm and dry, no edema. Ecchymosis of left dorsal hand. Epigastric skin lesion noted. NEUROLOGIC:  Alert and oriented x 3. No focal neuro deficits noted. PSYCHIATRIC:  Normal affect     ASSESSMENT:    1. Atrial flutter, unspecified type (HCC)   2. Coronary artery disease of native artery of native heart with stable angina pectoris (HCC)   3. Hyperlipidemia LDL goal <70   4. Essential hypertension   5. History of coronary angioplasty with insertion of stent   6. Secondary hypercoagulable state (HCC)   7. Long term current use of anticoagulant    PLAN:    Atrial flutter/atrial fibrillation -CHA2DS2/VAS Stroke Risk Points= 4  -no bleeding issues, continue apixaban -tolerating amiodarone. Now holding sinus rhythm post cardioversion -planned for ablation in September by Dr. Nelly Laurence -now that he is back in SR, he has not had significant swelling, and weight back to his baseline.  Will change lasix/potassium to PRN   CAD with history of prior stent Hypercholesterolemia -asymptomatic.  -stopped aspirin with start of apixaban -continue rosuvastatin 20 mg daily -LDL goal <70, see below re: patient preference, declines PCSK9i -reviewed red flag warning signs that need immediate medical attention  Hypertension: Overall goal <130/80 -recently normal at PCP visit, very elevated on initial reading here but improving on recheck -continue irbesartan-HCTZ 150-12.5 mg daily. We discussed that taking this at night may exacerbate nocturia, but he feels this is manageable and current regimen is working well -trialed one dose of carvedilol, BP not well controlled, returned to labetalol -discussed at length, he will change to labetalol 300 mg TID as he tried this for several days, felt well, feels he can continue TID dosing long term -has been on amlodipine in the past if additional agent needed in the future. Alternative would be to increase irbesartan-HCTZ dose to maximum dosing.   Cardiac risk counseling and prevention recommendations: -recommend heart healthy/Mediterranean diet, with whole grains, fruits, vegetable, fish, lean meats, nuts, and olive oil. Limit salt. -recommend moderate walking, 3-5 times/week for 30-50 minutes each session. Aim for at least 150 minutes.week. Goal should be pace of 3 miles/hours, or walking 1.5 miles in 30 minutes -recommend avoidance of tobacco products. Avoid excess alcohol.  Plan for follow up: 3 months per patient reference, or sooner as needed.  Jodelle Red, MD, PhD, [facility]  [facility]    Medication Adjustments/Labs and Tests Ordered: Current medicines are reviewed at length with the patient today.  Concerns regarding medicines are outlined above.   No orders of the defined types were placed in this encounter.  Meds ordered this encounter  Medications   furosemide (LASIX)  20 MG tablet    Sig: Take 1 tablet  (20 mg total) by mouth daily as needed.    Dispense:  30 tablet    Refill:  6    Please do not fill until patient calls   potassium chloride SA (KLOR-CON M) 20 MEQ tablet    Sig: Take 1 tablet (20 mEq total) by mouth daily as needed (when you take lasix).    Dispense:  90 tablet    Refill:  3    Please do not fill until patient calls   Patient Instructions  You can take the lasix as needed for lower leg swelling, weight gain of 3 lbs overnight or 5 lbs from normal. Take potassium when you take lasix.  I,Mathew Stumpf,acting as a Neurosurgeon for [employer], MD.,have documented all relevant documentation on the behalf of Jodelle Red, MD,as directed by  Jodelle Red, MD while in the presence of Jodelle Red, MD.  I, Jodelle Red, MD, have reviewed all documentation for this visit. The documentation on 04/03/23 for the exam, diagnosis, procedures, and orders are all accurate and complete.    Signed, Jodelle Red, MD PhD 04/03/2023    [facility]

## 2023-04-03 ENCOUNTER — Ambulatory Visit (HOSPITAL_BASED_OUTPATIENT_CLINIC_OR_DEPARTMENT_OTHER): Payer: Medicare PPO | Admitting: Cardiology

## 2023-04-03 ENCOUNTER — Encounter (HOSPITAL_BASED_OUTPATIENT_CLINIC_OR_DEPARTMENT_OTHER): Payer: Self-pay | Admitting: Cardiology

## 2023-04-03 VITALS — BP 148/66 | HR 53 | Ht 68.0 in | Wt 176.8 lb

## 2023-04-03 DIAGNOSIS — Z7901 Long term (current) use of anticoagulants: Secondary | ICD-10-CM

## 2023-04-03 DIAGNOSIS — I10 Essential (primary) hypertension: Secondary | ICD-10-CM

## 2023-04-03 DIAGNOSIS — D6869 Other thrombophilia: Secondary | ICD-10-CM | POA: Diagnosis not present

## 2023-04-03 DIAGNOSIS — E785 Hyperlipidemia, unspecified: Secondary | ICD-10-CM

## 2023-04-03 DIAGNOSIS — I4892 Unspecified atrial flutter: Secondary | ICD-10-CM

## 2023-04-03 DIAGNOSIS — Z955 Presence of coronary angioplasty implant and graft: Secondary | ICD-10-CM

## 2023-04-03 DIAGNOSIS — I25118 Atherosclerotic heart disease of native coronary artery with other forms of angina pectoris: Secondary | ICD-10-CM | POA: Diagnosis not present

## 2023-04-03 MED ORDER — FUROSEMIDE 20 MG PO TABS
20.0000 mg | ORAL_TABLET | Freq: Every day | ORAL | 6 refills | Status: DC | PRN
Start: 2023-04-03 — End: 2023-10-06

## 2023-04-03 MED ORDER — POTASSIUM CHLORIDE CRYS ER 20 MEQ PO TBCR
20.0000 meq | EXTENDED_RELEASE_TABLET | Freq: Every day | ORAL | 3 refills | Status: DC | PRN
Start: 2023-04-03 — End: 2023-10-06

## 2023-04-03 NOTE — Patient Instructions (Addendum)
Medication Instructions:  You can take the lasix as needed for lower leg swelling, weight gain of 3 lbs overnight or 5 lbs from normal. Take potassium when you take lasix.  *If you need a refill on your cardiac medications before your next appointment, please call your pharmacy*   Lab Work: N/A   Testing/Procedures: N/A   Follow-Up: At [facility], you and your health needs are our priority.  As part of our continuing mission to provide you with exceptional heart care, we have created designated Provider Care Teams.  These Care Teams include your primary Cardiologist (physician) and Advanced Practice Providers (APPs -  Physician Assistants and Nurse Practitioners) who all work together to provide you with the care you need, when you need it.  We recommend signing up for the patient portal called "MyChart".  Sign up information is provided on this After Visit Summary.  MyChart is used to connect with patients for Virtual Visits (Telemedicine).  Patients are able to view lab/test results, encounter notes, upcoming appointments, etc.  Non-urgent messages can be sent to your provider as well.   To learn more about what you can do with MyChart, go to [email or web address].    Your next appointment:   3 month(s)  Provider:   Jodelle Red, MD    Other Instructions None

## 2023-04-04 ENCOUNTER — Ambulatory Visit: Payer: Medicare PPO | Admitting: Internal Medicine

## 2023-04-04 ENCOUNTER — Encounter: Payer: Self-pay | Admitting: Internal Medicine

## 2023-04-04 ENCOUNTER — Other Ambulatory Visit (HOSPITAL_COMMUNITY)
Admission: RE | Admit: 2023-04-04 | Discharge: 2023-04-04 | Disposition: A | Discharge status: Home / Self Care | Payer: Medicare PPO | Source: Ambulatory Visit | Attending: Internal Medicine | Admitting: Internal Medicine

## 2023-04-04 VITALS — Ht 68.0 in

## 2023-04-04 DIAGNOSIS — C4491 Basal cell carcinoma of skin, unspecified: Secondary | ICD-10-CM | POA: Diagnosis not present

## 2023-04-04 DIAGNOSIS — L820 Inflamed seborrheic keratosis: Secondary | ICD-10-CM | POA: Diagnosis not present

## 2023-04-04 DIAGNOSIS — D485 Neoplasm of uncertain behavior of skin: Secondary | ICD-10-CM | POA: Insufficient documentation

## 2023-04-04 DIAGNOSIS — C44519 Basal cell carcinoma of skin of other part of trunk: Secondary | ICD-10-CM | POA: Diagnosis not present

## 2023-04-04 NOTE — Progress Notes (Addendum)
Procedure Note :     Procedure :  Skin biopsy   Indication:  Changing mole (s ),  Suspicious lesion(s)   Risks including unsuccessful procedure , bleeding, infection, bruising, scar, a need for another complete procedure and others were explained to the patient in detail as well as the benefits. Informed consent was obtained verbally.  The patient was placed in a decubitus position.  Lesion #1 on R forearm    measuring  6x5   mm   Skin over lesion #1  was prepped with alcohol  and anesthetized with 1 cc of 2% lidocaine and epinephrine, using a 25-gauge 1 inch needle.  Shave biopsy with a sterile Dermablade was carried out in the usual fashion. Hyfrecator was used to destroy the rest of the lesion potentially left behind and for hemostasis. Band-Aid was applied with antibiotic ointment.    Lesion #2 on   L upper abdomen  measuring  25x15 mm   Skin over lesion #2  was prepped with alcohol  and anesthetized with 3 cc of 2% lidocaine and epinephrine, using a 25-gauge 1 inch needle.  Shave biopsy with a sterile Dermablade was carried out in the usual fashion - partial removal. Hyfrecator was used to destroy the rest of the lesion potentially left behind within 1 mm margins outlined w/a skin marker and for hemostasis. Band-Aid was applied with antibiotic ointment.    Lesion #3 on L neck   measuring 5x4  mm   Skin over lesion #3  was prepped with alcohol  and anesthetized with 1 cc of 2% lidocaine and epinephrine, using a 25-gauge 1 inch needle.  Shave biopsy with a sterile Dermablade was carried out in the usual fashion. Hyfrecator was used to destroy the rest of the lesion potentially left behind and for hemostasis. Band-Aid was applied with antibiotic ointment. Discarded.   Tolerated well. Complications none.      Postprocedure instructions :    A Band-Aid should be  changed once or twice daily. You can take a shower tomorrow.  Keep the wounds clean. You can wash them with liquid soap  and water. Pat dry with gauze or a Kleenex tissue before applying antibiotic ointment and a Band-Aid.   You need to report immediately  if fever, chills or any signs of infection develop.    The biopsy results should be available in 1 -2 weeks.

## 2023-04-04 NOTE — Patient Instructions (Signed)
Postprocedure instructions :    A Band-Aid should be  changed once or twice daily. You can take a shower tomorrow.  Keep the wounds clean. You can wash them with liquid soap and water. Pat dry with gauze or a Kleenex tissue before applying antibiotic ointment and a Band-Aid.   You need to report immediately  if fever, chills or any signs of infection develop.    The biopsy results should be available in 1 -2 weeks.   

## 2023-04-04 NOTE — Addendum Note (Signed)
Addended by: Marlene Lard on: 04/04/2023 09:31 AM   Modules accepted: Orders

## 2023-04-04 NOTE — Assessment & Plan Note (Signed)
See procedures for lesions #1-3 If #2 is maignant - the pt will need a full excision. He understands.

## 2023-04-08 ENCOUNTER — Encounter: Payer: Self-pay | Admitting: Internal Medicine

## 2023-04-08 LAB — SURGICAL PATHOLOGY

## 2023-04-08 NOTE — Addendum Note (Signed)
Addended by: Tresa Garter on: 04/08/2023 11:45 PM   Modules accepted: Level of Service

## 2023-04-11 ENCOUNTER — Telehealth: Payer: Self-pay

## 2023-04-14 ENCOUNTER — Ambulatory Visit: Payer: Medicare PPO | Attending: Internal Medicine

## 2023-04-14 ENCOUNTER — Other Ambulatory Visit (HOSPITAL_BASED_OUTPATIENT_CLINIC_OR_DEPARTMENT_OTHER): Payer: Self-pay | Admitting: Cardiovascular Disease

## 2023-04-14 DIAGNOSIS — I4892 Unspecified atrial flutter: Secondary | ICD-10-CM | POA: Diagnosis not present

## 2023-04-14 DIAGNOSIS — Z5181 Encounter for therapeutic drug level monitoring: Secondary | ICD-10-CM | POA: Diagnosis not present

## 2023-04-14 DIAGNOSIS — I4891 Unspecified atrial fibrillation: Secondary | ICD-10-CM | POA: Diagnosis not present

## 2023-04-14 DIAGNOSIS — Z01810 Encounter for preprocedural cardiovascular examination: Secondary | ICD-10-CM | POA: Diagnosis not present

## 2023-04-15 LAB — CBC
Hematocrit: 45 % (ref 37.5–51.0)
Hemoglobin: 15 g/dL (ref 13.0–17.7)
MCH: 28.6 pg (ref 26.6–33.0)
MCHC: 33.3 g/dL (ref 31.5–35.7)
MCV: 86 fL (ref 79–97)
Platelets: 119 10*3/uL — ABNORMAL LOW (ref 150–450)
RBC: 5.25 x10E6/uL (ref 4.14–5.80)
RDW: 16.1 % — ABNORMAL HIGH (ref 11.6–15.4)
WBC: 4.3 10*3/uL (ref 3.4–10.8)

## 2023-04-15 LAB — BASIC METABOLIC PANEL
BUN/Creatinine Ratio: 13 (ref 10–24)
BUN: 16 mg/dL (ref 8–27)
CO2: 21 mmol/L (ref 20–29)
Calcium: 9.4 mg/dL (ref 8.6–10.2)
Chloride: 103 mmol/L (ref 96–106)
Creatinine, Ser: 1.24 mg/dL (ref 0.76–1.27)
Glucose: 99 mg/dL (ref 70–99)
Potassium: 3.8 mmol/L (ref 3.5–5.2)
Sodium: 141 mmol/L (ref 134–144)
eGFR: 60 mL/min/{1.73_m2} (ref >59)

## 2023-04-15 LAB — HEPATIC FUNCTION PANEL
ALT: 29 IU/L (ref 0–44)
AST: 27 IU/L (ref 0–40)
Albumin: 4.2 g/dL (ref 3.8–4.8)
Alkaline Phosphatase: 101 IU/L (ref 44–121)
Bilirubin Total: 0.6 mg/dL (ref 0.0–1.2)
Bilirubin, Direct: 0.18 mg/dL (ref 0.00–0.40)
Total Protein: 6.8 g/dL (ref 6.0–8.5)

## 2023-04-15 LAB — TSH: TSH: 3.24 u[IU]/mL (ref 0.450–4.500)

## 2023-04-24 IMAGING — DX DG CHEST 2V
2 series · 2 of 2 positions shown · non-contrast
Comparison: Chest x-ray 10/13/2014.

CLINICAL DATA: 75-year-old male with history of atrial flutter.
Chest congestion.

EXAM:
CHEST - 2 VIEW

[chest pa]
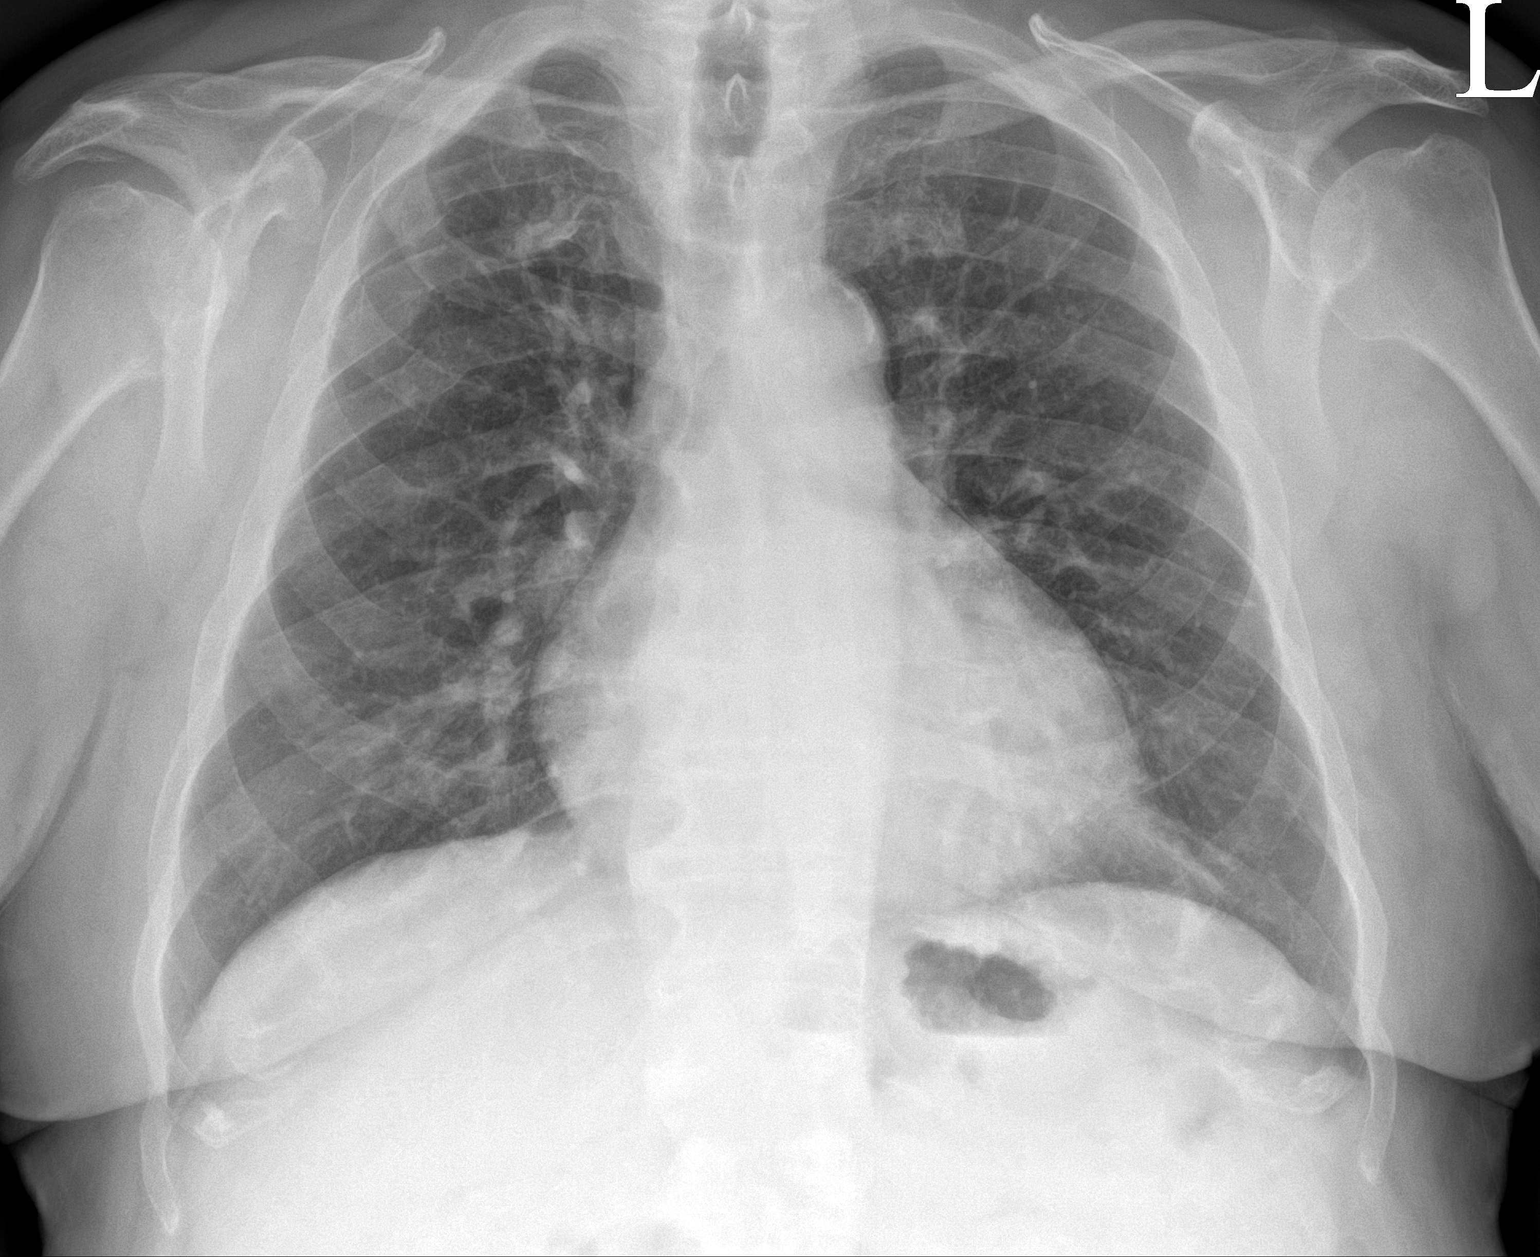

[chest lat]
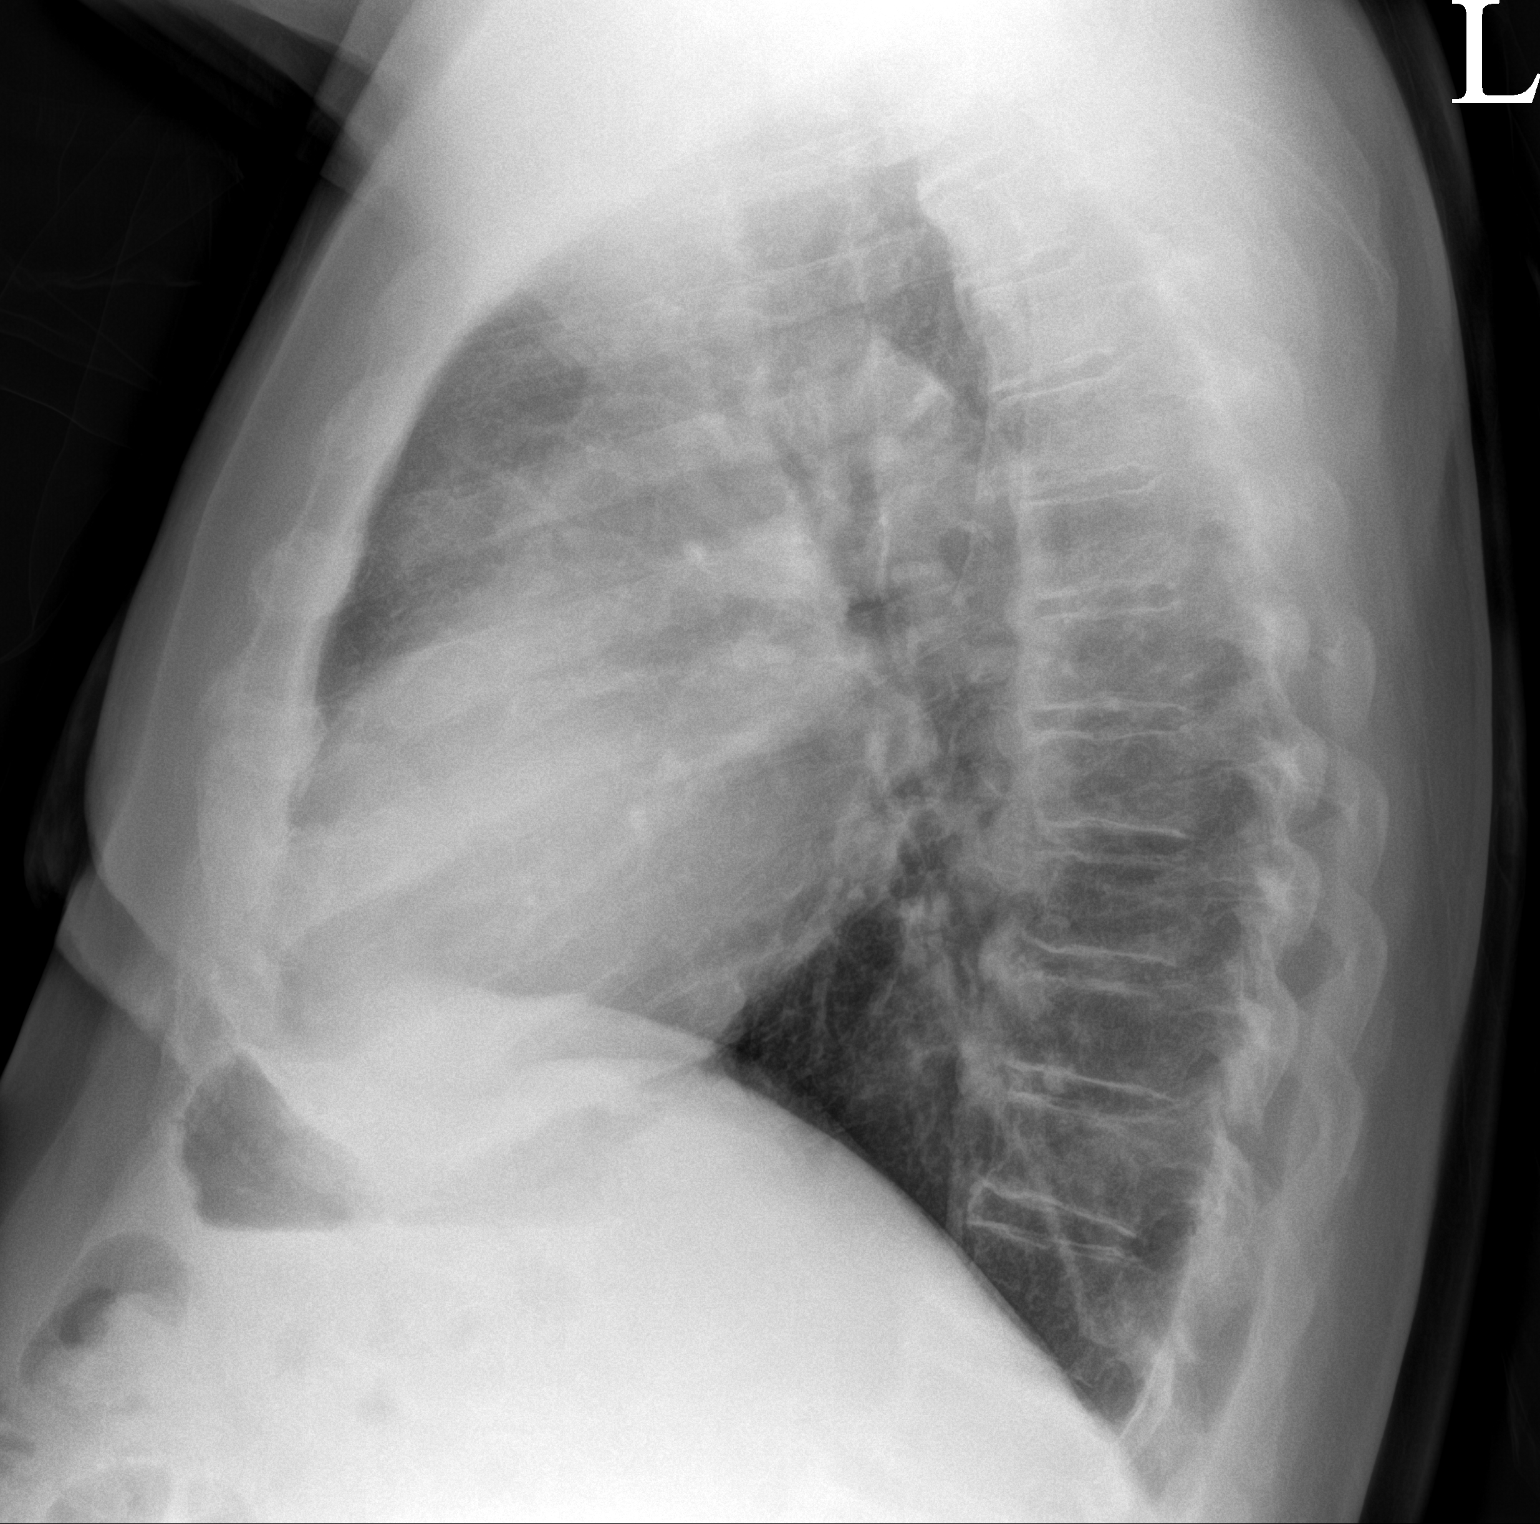

[2 of 2 positions shown; findings below may reference images not displayed]

FINDINGS: Lung volumes are normal. No consolidative airspace disease. No
pleural effusions. No pneumothorax. No pulmonary nodule or mass
noted. Pulmonary vasculature and the cardiomediastinal silhouette
are within normal limits. Atherosclerosis in the thoracic aorta.
Multiple old healed posterior right-sided rib fractures are again
noted.
IMPRESSION: 1. No radiographic evidence of acute cardiopulmonary disease.
2. Aortic atherosclerosis.

## 2023-05-06 ENCOUNTER — Telehealth (HOSPITAL_BASED_OUTPATIENT_CLINIC_OR_DEPARTMENT_OTHER): Payer: Self-pay | Admitting: Cardiology

## 2023-05-06 DIAGNOSIS — I10 Essential (primary) hypertension: Secondary | ICD-10-CM

## 2023-05-06 DIAGNOSIS — Z5181 Encounter for therapeutic drug level monitoring: Secondary | ICD-10-CM

## 2023-05-06 NOTE — Telephone Encounter (Signed)
Spoke with patient and reviewed last office visit recommendations of using Lasix and Furosemide on an as needed basis, patient verbalized understanding   Patient stated he had been taking his lasix on a daily basis. Order placed for BMET soon

## 2023-05-06 NOTE — Telephone Encounter (Signed)
Pt c/o medication issue:  1. Name of Medication:   furosemide (LASIX) 20 MG tablet  potassium chloride SA (KLOR-CON M) 20 MEQ tablet   2. How are you currently taking this medication (dosage and times per day)?    3. Are you having a reaction (difficulty breathing--STAT)?   4. What is your medication issue?    Patient wants to clarify if he will be on furosemide indefinitely and should he be taking the potassium to protect his kidney as he only took this for 4 days.

## 2023-05-09 ENCOUNTER — Other Ambulatory Visit: Payer: Self-pay | Admitting: Internal Medicine

## 2023-06-09 ENCOUNTER — Other Ambulatory Visit: Payer: Self-pay | Admitting: Internal Medicine

## 2023-06-30 DIAGNOSIS — I10 Essential (primary) hypertension: Secondary | ICD-10-CM | POA: Diagnosis not present

## 2023-06-30 DIAGNOSIS — Z5181 Encounter for therapeutic drug level monitoring: Secondary | ICD-10-CM | POA: Diagnosis not present

## 2023-07-02 NOTE — Telephone Encounter (Signed)
error 

## 2023-07-04 ENCOUNTER — Other Ambulatory Visit: Payer: Medicare PPO

## 2023-07-04 ENCOUNTER — Telehealth: Payer: Self-pay

## 2023-07-04 ENCOUNTER — Encounter (HOSPITAL_BASED_OUTPATIENT_CLINIC_OR_DEPARTMENT_OTHER): Payer: Self-pay | Admitting: Cardiology

## 2023-07-04 ENCOUNTER — Ambulatory Visit (HOSPITAL_BASED_OUTPATIENT_CLINIC_OR_DEPARTMENT_OTHER): Payer: Medicare PPO | Admitting: Cardiology

## 2023-07-04 VITALS — BP 142/66 | HR 48 | Ht 68.0 in | Wt 175.0 lb

## 2023-07-04 DIAGNOSIS — I10 Essential (primary) hypertension: Secondary | ICD-10-CM

## 2023-07-04 DIAGNOSIS — I480 Paroxysmal atrial fibrillation: Secondary | ICD-10-CM | POA: Diagnosis not present

## 2023-07-04 DIAGNOSIS — Z79899 Other long term (current) drug therapy: Secondary | ICD-10-CM

## 2023-07-04 DIAGNOSIS — I4892 Unspecified atrial flutter: Secondary | ICD-10-CM | POA: Diagnosis not present

## 2023-07-04 DIAGNOSIS — Z955 Presence of coronary angioplasty implant and graft: Secondary | ICD-10-CM

## 2023-07-04 DIAGNOSIS — I25118 Atherosclerotic heart disease of native coronary artery with other forms of angina pectoris: Secondary | ICD-10-CM

## 2023-07-04 DIAGNOSIS — D6869 Other thrombophilia: Secondary | ICD-10-CM

## 2023-07-04 DIAGNOSIS — Z7901 Long term (current) use of anticoagulants: Secondary | ICD-10-CM

## 2023-07-04 LAB — COMPREHENSIVE METABOLIC PANEL
ALT: 24 U/L (ref 0–53)
AST: 24 U/L (ref 0–37)
Albumin: 4.4 g/dL (ref 3.5–5.2)
Alkaline Phosphatase: 65 U/L (ref 39–117)
BUN: 20 mg/dL (ref 6–23)
CO2: 29 mEq/L (ref 19–32)
Calcium: 9.6 mg/dL (ref 8.4–10.5)
Chloride: 103 mEq/L (ref 96–112)
Creatinine, Ser: 1.18 mg/dL (ref 0.40–1.50)
GFR: 59.23 mL/min — ABNORMAL LOW (ref >60.00)
Glucose, Bld: 98 mg/dL (ref 70–99)
Potassium: 3.9 mEq/L (ref 3.5–5.1)
Sodium: 140 mEq/L (ref 135–145)
Total Bilirubin: 1.1 mg/dL (ref 0.2–1.2)
Total Protein: 7.2 g/dL (ref 6.0–8.3)

## 2023-07-04 LAB — URINALYSIS, ROUTINE W REFLEX MICROSCOPIC
Bilirubin Urine: NEGATIVE
Hgb urine dipstick: NEGATIVE
Ketones, ur: NEGATIVE
Leukocytes,Ua: NEGATIVE
Nitrite: NEGATIVE
Specific Gravity, Urine: 1.025 (ref 1.000–1.030)
Total Protein, Urine: 30 — AB
Urine Glucose: NEGATIVE
Urobilinogen, UA: 1 (ref 0.0–1.0)
pH: 6.5 (ref 5.0–8.0)

## 2023-07-04 LAB — CBC
HCT: 44.8 % (ref 39.0–52.0)
Hemoglobin: 14.9 g/dL (ref 13.0–17.0)
MCHC: 33.3 g/dL (ref 30.0–36.0)
MCV: 90.5 fl (ref 78.0–100.0)
Platelets: 116 10*3/uL — ABNORMAL LOW (ref 150.0–400.0)
RBC: 4.95 Mil/uL (ref 4.22–5.81)
RDW: 17.9 % — ABNORMAL HIGH (ref 11.5–15.5)
WBC: 4 10*3/uL (ref 4.0–10.5)

## 2023-07-04 NOTE — Telephone Encounter (Signed)
See previous note

## 2023-07-04 NOTE — Telephone Encounter (Signed)
Patient walked into office to get orders for lab work before his appointment on Monday. Reviewed patient labs from previous and will put in orders for CMet and CBC - medication management.

## 2023-07-04 NOTE — Addendum Note (Signed)
Addended by: Theressa Stamps on: 07/04/2023 01:23 PM   Modules accepted: Orders

## 2023-07-04 NOTE — Telephone Encounter (Signed)
What was in lab and insisted on a urine sample is needed. Went to speak with patient he was already gone but he did leave a sample. Placed order for urinalysis and provider can review during Monday appt.

## 2023-07-04 NOTE — Progress Notes (Signed)
Cardiology Office Note:  .    Date:  07/04/2023  ID:  Kerrie Buffalo., DOB 19-Jan-1946, MRN [id number] PCP: Tresa Garter, MD  [facility] Providers Cardiologist:  Jodelle Red, MD Electrophysiologist:  Maurice Small, MD     History of Present Illness: .    Nimesh Riolo. is a 77 y.o. male with a hx of CAD with remote stent, paroxysmal atrial flutter s/p cardioversion who is seen for follow up. I initially met him 09/07/2019 as a new consult at the request of [name], Georgina Quint, MD for the evaluation and management of CAD.   CV history: distal LAD stent, last cath 07/19/11 Father died of MI age 62. Hypercholesterolemia: prior LDL 124. Was told in the past his total cholesterol was over 400. Was happy that his total cholesterol is less than 200. Had cardioversion on 01/21/23 with Dr. Shari Prows. Converted with 1 shock to sinus with PACs.    Cardiovascular risk factors: Prior clinical ASCVD: CAD with PCI to LAD. ?CVA in 2006/07/18 (quarter sized bleed in his brain--lightheadedness/dizziness), unclear ?seizure event 07/19/2011 Comorbid conditions: hypertension, hyperlipidemia Prior cardiac testing: cath 2010, 2012. Echo 07/18/2014. MPI 1997, 20002002/07/1707/17/03   Reviewed options for management of arrhythmia. He established care with EP Dr. Nelly Laurence 03/14/23. He was scheduled for ablation in September. In the interim he was started on amiodarone and planned for DCCV in 2 weeks.On 4/26 he underwent DCC x 2 150 J then 200 J biphasic and converted from afib rate 98 to NSR rate 72 bpm. He saw his PCP 4/30 and was feeling better.   At his visit 04/2023, he was maintaining sinus rhythm and feeling good. He denied episodes of Afib since his cardioversion. He is scheduled for ablation with Dr. Nelly Laurence 08/07/2023. In clinic his BP was elevated to 184/70 (148/66 on recheck). Had been 122/70 at his PCP's office. We changed labetalol to 300 mg TID. He wished to discuss if he needed to continue taking  Lasix. He denied issues with swelling. We changed his Lasix and potassium to as needed.    Today, he states he is generally feeling okay. No new concerns today. No issues with palpitations.  At night before bed, he may feel like he has some numbness and swelling in his toes and top of the feet. Not nearly as severe as it was last year. His symptoms do improve by the next morning.  In the office today his blood pressure is 159/62 initially, and 142/66 on manual recheck.   He denies any chest pain, shortness of breath, lightheadedness, headaches, syncope, orthopnea, or PND.  ROS:  Please see the history of present illness. ROS otherwise negative except as noted.  (+) Mild numbness/swelling of bilateral toes  Studies Reviewed: [name]       No new today  Physical Exam:    VS:  BP (!) 142/66 (BP Location: Right Arm, Patient Position: Sitting, Cuff Size: Normal)   Pulse (!) 48   Ht 5\' 8"  (1.727 m)   Wt 175 lb (79.4 kg)   SpO2 95%   BMI 26.61 kg/m    Wt Readings from Last 3 Encounters:  07/04/23 175 lb (79.4 kg)  04/03/23 176 lb 12.8 oz (80.2 kg)  04/01/23 175 lb (79.4 kg)    GEN: Well nourished, well developed in no acute distress HEENT: Normal, moist mucous membranes NECK: No JVD CARDIAC: regular rhythm, normal S1 and S2, no rubs or gallops. No murmur. VASCULAR: Radial and  DP pulses 2+ bilaterally. No carotid bruits RESPIRATORY:  Clear to auscultation without rales, wheezing or rhonchi  ABDOMEN: Soft, non-tender, non-distended MUSCULOSKELETAL:  Ambulates independently SKIN: Warm and dry, no edema NEUROLOGIC:  Alert and oriented x 3. No focal neuro deficits noted. PSYCHIATRIC:  Normal affect   ASSESSMENT AND PLAN: .    Atrial flutter/atrial fibrillation -CHA2DS2/VAS Stroke Risk Points= 4  -no bleeding issues, continue apixaban -tolerating amiodarone. Now holding sinus rhythm post cardioversion. Suspect this will be stopped once he is post ablation -planned for ablation in  September by Dr. Nelly Laurence -now that he is back in SR, he has not had significant swelling, and weight back to his baseline. Will change lasix/potassium to PRN   CAD with history of prior stent Hypercholesterolemia -asymptomatic.  -stopped aspirin with start of apixaban -continue rosuvastatin 20 mg daily -LDL goal <70, last 112, patient preference: declines PCSK9i/additional therapy -reviewed red flag warning signs that need immediate medical attention   Hypertension: Overall goal <130/80 -typically elevated on initial reading here but improves on recheck -continue irbesartan-HCTZ 150-12.5 mg daily. We discussed that taking this at night may exacerbate nocturia, but he feels this is manageable and current regimen is working well -trialed one dose of carvedilol, BP not well controlled, returned to labetalol -continue labetalol 300 mg BID -has been on amlodipine in the past if additional agent needed in the future. Alternative would be to increase irbesartan-HCTZ dose to maximum dosing.    Cardiac risk counseling and prevention recommendations: -recommend heart healthy/Mediterranean diet, with whole grains, fruits, vegetable, fish, lean meats, nuts, and olive oil. Limit salt. -recommend moderate walking, 3-5 times/week for 30-50 minutes each session. Aim for at least 150 minutes.week. Goal should be pace of 3 miles/hours, or walking 1.5 miles in 30 minutes -recommend avoidance of tobacco products. Avoid excess alcohol.  Dispo: Follow-up in 6 months, or sooner as needed.  I,Mathew Stumpf,acting as a Neurosurgeon for [employer], MD.,have documented all relevant documentation on the behalf of Jodelle Red, MD,as directed by  Jodelle Red, MD while in the presence of Jodelle Red, MD.  I, Jodelle Red, MD, have reviewed all documentation for this visit. The documentation on 07/04/23 for the exam, diagnosis, procedures, and orders are all accurate and  complete.   Signed, Jodelle Red, MD

## 2023-07-04 NOTE — Patient Instructions (Signed)
Medication Instructions:  The current medical regimen is effective;  continue present plan and medications.   *If you need a refill on your cardiac medications before your next appointment, please call your pharmacy*   Lab Work: None  Testing/Procedures: None   Follow-Up: At [facility], you and your health needs are our priority.  As part of our continuing mission to provide you with exceptional heart care, we have created designated Provider Care Teams.  These Care Teams include your primary Cardiologist (physician) and Advanced Practice Providers (APPs -  Physician Assistants and Nurse Practitioners) who all work together to provide you with the care you need, when you need it.  We recommend signing up for the patient portal called "MyChart".  Sign up information is provided on this After Visit Summary.  MyChart is used to connect with patients for Virtual Visits (Telemedicine).  Patients are able to view lab/test results, encounter notes, upcoming appointments, etc.  Non-urgent messages can be sent to your provider as well.   To learn more about what you can do with MyChart, go to [email or web address].    Your next appointment:   6 month(s)  Provider:   Bridgette Christopher, MD    Other Instructions None  

## 2023-07-07 ENCOUNTER — Ambulatory Visit (INDEPENDENT_AMBULATORY_CARE_PROVIDER_SITE_OTHER): Payer: Medicare PPO | Admitting: Internal Medicine

## 2023-07-07 ENCOUNTER — Telehealth (HOSPITAL_BASED_OUTPATIENT_CLINIC_OR_DEPARTMENT_OTHER): Payer: Self-pay | Admitting: Cardiology

## 2023-07-07 ENCOUNTER — Encounter: Payer: Self-pay | Admitting: Internal Medicine

## 2023-07-07 VITALS — BP 130/78 | HR 51 | Temp 98.0°F | Ht 68.0 in | Wt 178.0 lb

## 2023-07-07 DIAGNOSIS — E785 Hyperlipidemia, unspecified: Secondary | ICD-10-CM | POA: Diagnosis not present

## 2023-07-07 DIAGNOSIS — D696 Thrombocytopenia, unspecified: Secondary | ICD-10-CM | POA: Diagnosis not present

## 2023-07-07 DIAGNOSIS — R7989 Other specified abnormal findings of blood chemistry: Secondary | ICD-10-CM

## 2023-07-07 DIAGNOSIS — I482 Chronic atrial fibrillation, unspecified: Secondary | ICD-10-CM | POA: Diagnosis not present

## 2023-07-07 DIAGNOSIS — Z8546 Personal history of malignant neoplasm of prostate: Secondary | ICD-10-CM

## 2023-07-07 DIAGNOSIS — I2510 Atherosclerotic heart disease of native coronary artery without angina pectoris: Secondary | ICD-10-CM | POA: Diagnosis not present

## 2023-07-07 LAB — MICROALBUMIN / CREATININE URINE RATIO
Creatinine,U: 213.9 mg/dL
Microalb Creat Ratio: 4.1 mg/g (ref 0.0–30.0)
Microalb, Ur: 8.7 mg/dL — ABNORMAL HIGH (ref 0.0–1.9)

## 2023-07-07 NOTE — Telephone Encounter (Signed)
Patient called about his lab work he had done for Dr. Elonda Husky (PCP) on Friday.  He has a question about his kidney function.

## 2023-07-07 NOTE — Assessment & Plan Note (Signed)
F/u w/Dr Cristal Deer Remote STENT Chronic No angina On Crestor, Eliquis

## 2023-07-07 NOTE — Progress Notes (Signed)
Subjective:  Patient ID: Andrew Joseph., male    DOB: October 19, 1946  Age: 77 y.o. MRN: [id number]  CC: Follow-up (3 MNTH F/U)   HPI Andrew Joseph. presents for CAD, low plt count, CRI  Outpatient Medications Prior to Visit  Medication Sig Dispense Refill   amiodarone (PACERONE) 200 MG tablet Take 200 mg by mouth daily.     diltiazem (CARDIZEM) 30 MG tablet Take up to twice daily for heart rates greater than 90bpm. 60 tablet 1   ELIQUIS 5 MG TABS tablet TAKE 1 TABLET BY MOUTH TWICE DAILY 180 tablet 2   furosemide (LASIX) 20 MG tablet Take 1 tablet (20 mg total) by mouth daily as needed. 30 tablet 6   irbesartan-hydrochlorothiazide (AVALIDE) 150-12.5 MG tablet TAKE 1 TABLET BY MOUTH DAILY 90 tablet 3   labetalol (NORMODYNE) 300 MG tablet Take 1 tablet (300 mg total) by mouth 2 (two) times daily. 180 tablet 3   potassium chloride SA (KLOR-CON M) 20 MEQ tablet Take 1 tablet (20 mEq total) by mouth daily as needed (when you take lasix). 90 tablet 3   rosuvastatin (CRESTOR) 20 MG tablet TAKE 1 TABLET BY MOUTH DAILY 90 tablet 1   triamcinolone cream (KENALOG) 0.5 % APPLY TOPICALLY 3 TIMES DAILY AS NEEDED (IRRITATION) 60 g 1   zolpidem (AMBIEN) 10 MG tablet TAKE 1 TABLET BY MOUTH AT BEDTIME 90 tablet 1   No facility-administered medications prior to visit.    ROS: Review of Systems  Constitutional:  Negative for appetite change, fatigue and unexpected weight change.  HENT:  Negative for congestion, nosebleeds, sneezing, sore throat and trouble swallowing.   Eyes:  Negative for itching and visual disturbance.  Respiratory:  Negative for cough.   Cardiovascular:  Negative for chest pain, palpitations and leg swelling.  Gastrointestinal:  Negative for abdominal distention, blood in stool, diarrhea and nausea.  Genitourinary:  Negative for frequency and hematuria.  Musculoskeletal:  Negative for back pain, gait problem, joint swelling and neck pain.  Skin:  Negative for rash.   Neurological:  Negative for dizziness, tremors, speech difficulty and weakness.  Psychiatric/Behavioral:  Negative for agitation, dysphoric mood and sleep disturbance. The patient is not nervous/anxious.     Objective:  BP 130/78 (BP Location: Right Arm, Patient Position: Sitting, Cuff Size: Large)   Pulse (!) 51   Temp 98 F (36.7 C) (Oral)   Ht 5\' 8"  (1.727 m)   Wt 178 lb (80.7 kg)   SpO2 95%   BMI 27.06 kg/m   BP Readings from Last 3 Encounters:  07/07/23 130/78  07/04/23 (!) 142/66  04/03/23 (!) 148/66    Wt Readings from Last 3 Encounters:  07/07/23 178 lb (80.7 kg)  07/04/23 175 lb (79.4 kg)  04/03/23 176 lb 12.8 oz (80.2 kg)    Physical Exam Constitutional:      General: He is not in acute distress.    Appearance: He is well-developed. He is obese.     Comments: NAD  Eyes:     Conjunctiva/sclera: Conjunctivae normal.     Pupils: Pupils are equal, round, and reactive to light.  Neck:     Thyroid: No thyromegaly.     Vascular: No JVD.  Cardiovascular:     Rate and Rhythm: Normal rate and regular rhythm.     Heart sounds: Normal heart sounds. No murmur heard.    No friction rub. No gallop.  Pulmonary:     Effort: Pulmonary effort is normal. No  respiratory distress.     Breath sounds: Normal breath sounds. No wheezing or rales.  Chest:     Chest wall: No tenderness.  Abdominal:     General: Bowel sounds are normal. There is no distension.     Palpations: Abdomen is soft. There is no mass.     Tenderness: There is no abdominal tenderness. There is no guarding or rebound.  Musculoskeletal:        General: No tenderness. Normal range of motion.     Cervical back: Normal range of motion.  Lymphadenopathy:     Cervical: No cervical adenopathy.  Skin:    General: Skin is warm and dry.     Findings: No rash.  Neurological:     Mental Status: He is alert and oriented to person, place, and time.     Cranial Nerves: No cranial nerve deficit.     Motor: No  abnormal muscle tone.     Coordination: Coordination normal.     Gait: Gait normal.     Deep Tendon Reflexes: Reflexes are normal and symmetric.  Psychiatric:        Behavior: Behavior normal.        Thought Content: Thought content normal.        Judgment: Judgment normal.     Lab Results  Component Value Date   WBC 4.0 07/04/2023   HGB 14.9 07/04/2023   HCT 44.8 07/04/2023   PLT 116.0 (L) 07/04/2023   GLUCOSE 98 07/04/2023   CHOL 164 03/31/2023   TRIG 49.0 03/31/2023   HDL 42.40 03/31/2023   LDLDIRECT 296.1 08/12/2013   LDLCALC 112 (H) 03/31/2023   ALT 24 07/04/2023   AST 24 07/04/2023   NA 140 07/04/2023   K 3.9 07/04/2023   CL 103 07/04/2023   CREATININE 1.18 07/04/2023   BUN 20 07/04/2023   CO2 29 07/04/2023   TSH 3.240 04/14/2023   PSA 0.00 (L) 03/31/2023   INR 0.99 07/31/2011   HGBA1C 5.8 03/09/2014    No results found.  Assessment & Plan:   Problem List Items Addressed This Visit     Dyslipidemia    Cont on Cresor      Coronary artery disease involving native coronary artery of native heart without angina pectoris - Primary    F/u w/Dr Cristal Deer Remote STENT Chronic No angina On Crestor, Eliquis      Relevant Orders   Comprehensive metabolic panel   Lipid panel   Microalbumin / creatinine urine ratio   CBC with Differential/Platelet   PROSTATE CANCER, HX OF   Relevant Orders   PSA   Thrombocytopenia, unspecified (HCC)    Chronic - ?etiology Hematology ref was offered      Atrial fibrillation (HCC)    F/u w/Dr Cristal Deer Ablation pending - Sept 2024 Chronic No angina On Eliquis      Relevant Orders   Comprehensive metabolic panel   Lipid panel   Microalbumin / creatinine urine ratio   CBC with Differential/Platelet   Abnormal serum creatinine level    New borderline low GFR Hydrate better      Relevant Orders   Comprehensive metabolic panel   Lipid panel   Microalbumin / creatinine urine ratio   CBC with  Differential/Platelet      No orders of the defined types were placed in this encounter.     Follow-up: Return in about 4 months (around 11/06/2023) for a follow-up visit.  Sonda Primes, MD

## 2023-07-07 NOTE — Assessment & Plan Note (Addendum)
New borderline low GFR Hydrate better

## 2023-07-07 NOTE — Assessment & Plan Note (Signed)
Chronic - ?etiology Hematology ref was offered

## 2023-07-07 NOTE — Assessment & Plan Note (Addendum)
Cont on [employer]

## 2023-07-07 NOTE — Assessment & Plan Note (Signed)
F/u w/Dr Cristal Deer Ablation pending - Sept 2024 Chronic No angina On Eliquis

## 2023-07-07 NOTE — Telephone Encounter (Signed)
Spoke with patient who saw Dr Cristal Deer 8/2  Amiodarone was discussed at visit, labs done same day by PCP  Total protein in urine 30 Friday, additional labs done today Patient concerned abnormal urine/labs secondary to Amiodarone  He would like labs and medications viewed to make sure Amiodarone/other medications causing issues  Will forward to Ronn Melena NP and Dr Cristal Deer for review

## 2023-07-08 NOTE — Telephone Encounter (Signed)
Pt is requesting a call back from Dr. Loren Racer nurse to review his labs and urinalysis when we have the results.   Please call pt and advise:  [phone]

## 2023-07-08 NOTE — Telephone Encounter (Signed)
Discussed at his OV Thx

## 2023-07-08 NOTE — Telephone Encounter (Signed)
On Amiodarone we recommend monitoring of liver function (AST/ALT) and thyroid function (TSH). Both were normal in May. His liver function from recent labs not resulted yet. Amiodarone would not affect urine protein.   Alver Sorrow, NP

## 2023-07-08 NOTE — Telephone Encounter (Signed)
Advised patient, verbalized understanding  

## 2023-07-14 ENCOUNTER — Telehealth: Payer: Self-pay | Admitting: Internal Medicine

## 2023-07-14 DIAGNOSIS — R809 Proteinuria, unspecified: Secondary | ICD-10-CM

## 2023-07-14 NOTE — Telephone Encounter (Signed)
Pls advise../lmb 

## 2023-07-14 NOTE — Telephone Encounter (Signed)
Patient would like a call regarding recent lab results, states that he has some concerns with kidney issues due to medical history and medications he is on. Best callback number is [phone].

## 2023-07-16 DIAGNOSIS — R809 Proteinuria, unspecified: Secondary | ICD-10-CM | POA: Insufficient documentation

## 2023-07-16 NOTE — Telephone Encounter (Signed)
No change in plans.  Hydrate yourself well. Repeat labs in 3 months 1 week prior to the office visit. See me with the results -3 months follow up Thank you

## 2023-07-16 NOTE — Telephone Encounter (Signed)
Notified pt w/MD response.../lmb 

## 2023-07-18 ENCOUNTER — Ambulatory Visit: Payer: Medicare PPO

## 2023-07-18 ENCOUNTER — Encounter: Payer: Self-pay | Admitting: Cardiovascular Disease

## 2023-07-18 ENCOUNTER — Ambulatory Visit: Payer: Medicare PPO | Admitting: Cardiovascular Disease

## 2023-07-18 VITALS — BP 160/70 | HR 50 | Ht 68.0 in | Wt 174.4 lb

## 2023-07-18 DIAGNOSIS — I4892 Unspecified atrial flutter: Secondary | ICD-10-CM

## 2023-07-18 DIAGNOSIS — Z01810 Encounter for preprocedural cardiovascular examination: Secondary | ICD-10-CM

## 2023-07-18 DIAGNOSIS — I4819 Other persistent atrial fibrillation: Secondary | ICD-10-CM

## 2023-07-18 NOTE — Progress Notes (Signed)
Electrophysiology Office Note:    Date:  07/18/2023   ID:  Kerrie Buffalo., DOB September 08, 1946, MRN [id number]  PCP:  Tresa Garter, MD   [facility] Providers Cardiologist:  Jodelle Red, MD Electrophysiologist:  Maurice Small, MD     Referring MD: Tresa Garter, MD   History of Present Illness:    Andrew Joseph. is a 77 y.o. male with a hx listed below, significant for coronary disease status post PCI, questionable stroke in 2007, hypertension, hyperlipidemia, atrial flutter, referred for arrhythmia management.  He reports that he has had paroxysms of rapid heart rates for years, frequently waking him at night.  He developed persistent atrial flutter and had a DC cardioversion in May 2022.  He did fine for a few years but then had recurrence of atrial flutter. He had a second cardioversion for atrial flutter on January 21, 2023.  He was noted to have early recurrence of flutter and follow-up on March 5.  He notes that he felt better after cardioversion with improved fatigue and shortness of breath.  He notes that he has been less active with atrial flutter.  He has been followed in general cardiology clinic for heart failure symptoms and hypertension.  He presents today for follow-up prior to atrial fibrillation ablation scheduled for September 5.  He reports that he is doing well and has not had any changes in symptoms or diagnoses here recently.   Past Medical History:  Diagnosis Date   Allergic rhinitis, cause unspecified    Allergy    SEASONAL   Anxiety    Blood in stool    BPH (benign prostatic hyperplasia)    CAD (coronary artery disease)    DES mid lad 08/15/2009   Cancer (HCC) 2009   prostate   Chest pain, unspecified    Colon polyps    CVA (cerebrovascular accident due to intracerebral hemorrhage) [facility]) 2007 [place]   History of prostate cancer 2009   Dr Annabell Howells   HTN (hypertension)    Hypercholesterolemia    Hyperlipidemia     Hypokalemia    Insomnia, persistent    Neoplasm of uncertain behavior of skin    Stroke [facility])    2007   Syncope    Tubular adenoma of colon 2010    Past Surgical History:  Procedure Laterality Date   ANGIOPLASTY     with stents   CARDIOVERSION N/A 04/13/2021   Procedure: CARDIOVERSION;  Surgeon: Chrystie Nose, MD;  Location: [facility] ENDOSCOPY;  Service: Cardiovascular;  Laterality: N/A;   CARDIOVERSION N/A 01/21/2023   Procedure: CARDIOVERSION;  Surgeon: Meriam Sprague, MD;  Location: [facility] ENDOSCOPY;  Service: Cardiovascular;  Laterality: N/A;   CARDIOVERSION N/A 03/28/2023   Procedure: CARDIOVERSION;  Surgeon: Wendall Stade, MD;  Location: [facility] INVASIVE CV LAB;  Service: Cardiovascular;  Laterality: N/A;   COLONOSCOPY  2016   Russella Dar, TA's   POLYPECTOMY     PROSTATECTOMY  2009   TONSILLECTOMY      Current Medications: Current Meds  Medication Sig   amiodarone (PACERONE) 200 MG tablet Take 200 mg by mouth daily.   diltiazem (CARDIZEM) 30 MG tablet Take up to twice daily for heart rates greater than 90bpm.   ELIQUIS 5 MG TABS tablet TAKE 1 TABLET BY MOUTH TWICE DAILY   furosemide (LASIX) 20 MG tablet Take 1 tablet (20 mg total) by mouth daily as needed.   irbesartan-hydrochlorothiazide (AVALIDE) 150-12.5 MG tablet TAKE 1 TABLET BY MOUTH  DAILY   labetalol (NORMODYNE) 300 MG tablet Take 1 tablet (300 mg total) by mouth 2 (two) times daily.   potassium chloride SA (KLOR-CON M) 20 MEQ tablet Take 1 tablet (20 mEq total) by mouth daily as needed (when you take lasix).   rosuvastatin (CRESTOR) 20 MG tablet TAKE 1 TABLET BY MOUTH DAILY   triamcinolone cream (KENALOG) 0.5 % APPLY TOPICALLY 3 TIMES DAILY AS NEEDED (IRRITATION)   zolpidem (AMBIEN) 10 MG tablet TAKE 1 TABLET BY MOUTH AT BEDTIME     Allergies:   Amlodipine, Iodine, and Shellfish allergy   Social and Family History: Reviewed in Epic  ROS:   Please see the history of present illness.    All other systems reviewed and  are negative.  EKGs/Labs/Other Studies Reviewed Today:    Echocardiogram:  04/11/2021  1. Left ventricular ejection fraction, by estimation, is 45 to 50%. The  left ventricle has mildly decreased function. The left ventricle  demonstrates global hypokinesis. Left ventricular diastolic parameters  were normal.   2. Right ventricular systolic function is normal. The right ventricular  size is mildly enlarged. There is normal pulmonary artery systolic  pressure.   3. Left atrial size was severely dilated.   4. Right atrial size was moderately dilated.   5. The mitral valve is degenerative. No evidence of mitral valve  regurgitation. No evidence of mitral stenosis.   6. The aortic valve is calcified. There is mild calcification of the  aortic valve. There is mild thickening of the aortic valve. Aortic valve  regurgitation is mild. Mild to moderate aortic valve  sclerosis/calcification is present, without any evidence  of aortic stenosis. Aortic regurgitation PHT measures 351 msec.   7. The inferior vena cava is normal in size with greater than 50%  respiratory variability, suggesting right atrial pressure of 3 mmHg.    Monitors:   Stress testing:   Advanced imaging:    EKG:  Last EKG results: today - atrial fibrillation, prolonged QT 01/03/2023 ECG --   Recent Labs: 03/07/2023: BNP 413.6 04/14/2023: TSH 3.240 07/04/2023: ALT 24; BUN 20; Creatinine, Ser 1.18; Hemoglobin 14.9; Platelets 116.0; Potassium 3.9; Sodium 140     Physical Exam:    VS:  BP (!) 160/70 (BP Location: Left Arm, Patient Position: Sitting, Cuff Size: Large)   Pulse (!) 50   Ht 5\' 8"  (1.727 m)   Wt 174 lb 6.4 oz (79.1 kg)   SpO2 98%   BMI 26.52 kg/m     Wt Readings from Last 3 Encounters:  07/18/23 174 lb 6.4 oz (79.1 kg)  07/07/23 178 lb (80.7 kg)  07/04/23 175 lb (79.4 kg)     GEN:  Well nourished, well developed in no acute distress CARDIAC: iRRR, no murmurs, rubs, gallops RESPIRATORY:  Normal  work of breathing MUSCULOSKELETAL: no edema    ASSESSMENT & PLAN:    Atrial flutter Will perform CTI ablation at the time of atrial fibrillation ablation  Atrial fibrillation Symptomatic, persistent He is scheduled for AF ablation 9/5 Continue amiodarone for 2 months after the ablation Continue anticoagulation TEE on the table at the time of AF ablation. No CT due to contrast allergy  We discussed the indication, rationale, logistics, anticipated benefits, and potential risks of the ablation procedure including but not limited to -- bleed at the groin access site, chest pain, damage to nearby organs such as the diaphragm, lungs, or esophagus, need for a drainage tube, or prolonged hospitalization. I explained that the risk for  stroke, heart attack, need for open chest surgery, or even death is very low but not zero. he  expressed understanding and wishes to proceed.   Coronary vascular disease On labetalol, eliquis  History of contrast allergy TEE rather than CT for pre-op eval          Medication Adjustments/Labs and Tests Ordered: Current medicines are reviewed at length with the patient today.  Concerns regarding medicines are outlined above.  Orders Placed This Encounter  Procedures   EKG 12-Lead   No orders of the defined types were placed in this encounter.    Signed, Maurice Small, MD  07/18/2023 11:28 AM     [facility]

## 2023-07-18 NOTE — Patient Instructions (Signed)
Medication Instructions:  Your physician recommends that you continue on your current medications as directed. Please refer to the Current Medication list given to you today. *If you need a refill on your cardiac medications before your next appointment, please call your pharmacy*   Follow-Up: At [facility], you and your health needs are our priority.  As part of our continuing mission to provide you with exceptional heart care, we have created designated Provider Care Teams.  These Care Teams include your primary Cardiologist (physician) and Advanced Practice Providers (APPs -  Physician Assistants and Nurse Practitioners) who all work together to provide you with the care you need, when you need it.  We recommend signing up for the patient portal called "MyChart".  Sign up information is provided on this After Visit Summary.  MyChart is used to connect with patients for Virtual Visits (Telemedicine).  Patients are able to view lab/test results, encounter notes, upcoming appointments, etc.  Non-urgent messages can be sent to your provider as well.   To learn more about what you can do with MyChart, go to [email or web address].    Your next appointment:   We will schedule follow up after your ablation   Provider:   York Pellant, MD

## 2023-07-19 LAB — CBC
Hematocrit: 42.4 % (ref 37.5–51.0)
Hemoglobin: 14.4 g/dL (ref 13.0–17.7)
MCH: 30.6 pg (ref 26.6–33.0)
MCHC: 34 g/dL (ref 31.5–35.7)
MCV: 90 fL (ref 79–97)
Platelets: 120 10*3/uL — ABNORMAL LOW (ref 150–450)
RBC: 4.71 x10E6/uL (ref 4.14–5.80)
RDW: 15.5 % — ABNORMAL HIGH (ref 11.6–15.4)
WBC: 4 10*3/uL (ref 3.4–10.8)

## 2023-07-19 LAB — BASIC METABOLIC PANEL
BUN/Creatinine Ratio: 13 (ref 10–24)
BUN: 15 mg/dL (ref 8–27)
CO2: 24 mmol/L (ref 20–29)
Calcium: 9.3 mg/dL (ref 8.6–10.2)
Chloride: 104 mmol/L (ref 96–106)
Creatinine, Ser: 1.18 mg/dL (ref 0.76–1.27)
Glucose: 97 mg/dL (ref 70–99)
Potassium: 3.5 mmol/L (ref 3.5–5.2)
Sodium: 143 mmol/L (ref 134–144)
eGFR: 63 mL/min/{1.73_m2} (ref >59)

## 2023-07-21 ENCOUNTER — Telehealth: Payer: Self-pay

## 2023-07-21 NOTE — Telephone Encounter (Signed)
Spoke with patient regarding upcoming ablation, due to personal reasons he requested to cancel his ablation on 9/5. Patient states he doesn't wish to reschedule at this point but wanted an appointment to discuss medications with Dr Nelly Laurence. Scheduled for 08/21/23 - no further needs at this tim

## 2023-08-07 ENCOUNTER — Encounter (HOSPITAL_COMMUNITY): Payer: Self-pay

## 2023-08-07 ENCOUNTER — Ambulatory Visit (HOSPITAL_COMMUNITY): Admit: 2023-08-07 | Payer: Medicare PPO | Admitting: Cardiovascular Disease

## 2023-08-07 SURGERY — ATRIAL FIBRILLATION ABLATION
Anesthesia: General

## 2023-08-21 ENCOUNTER — Ambulatory Visit: Payer: Medicare PPO | Admitting: Cardiovascular Disease

## 2023-09-02 ENCOUNTER — Telehealth: Payer: Self-pay | Admitting: Cardiology

## 2023-09-02 DIAGNOSIS — E785 Hyperlipidemia, unspecified: Secondary | ICD-10-CM

## 2023-09-02 DIAGNOSIS — Z5181 Encounter for therapeutic drug level monitoring: Secondary | ICD-10-CM

## 2023-09-02 NOTE — Telephone Encounter (Signed)
Patient requesting to have a complete blood work and urine done, prior to seeing the dr on 10/7. Please advise

## 2023-09-02 NOTE — Telephone Encounter (Signed)
Returned call to patient,   Advised patient of provider recommendations. Patient states he is concerned about his prior protein showing in his urine. Advised him that he would need to call his PCP to have further urine labs drawn. He is not concerned about his lipids and is only agreeable to an LFT at this time. Ordered and lab hours given to patient.

## 2023-09-02 NOTE — Telephone Encounter (Signed)
BMP, CBC 07/2023 were stable. Lipid last collect 03/2023. If he wishes, can collect FLP/LFT prior to OV. However, if no significant lifestyle changes from last OV low likelihood would have changed significantly from April collection. No cardiac indication for UA.   Alver Sorrow, NP

## 2023-09-02 NOTE — Telephone Encounter (Signed)
Ok to order labs prior to 10/7 OV

## 2023-09-04 DIAGNOSIS — Z5181 Encounter for therapeutic drug level monitoring: Secondary | ICD-10-CM | POA: Diagnosis not present

## 2023-09-04 LAB — HEPATIC FUNCTION PANEL
ALT: 24 [IU]/L (ref 0–44)
AST: 23 [IU]/L (ref 0–40)
Albumin: 4.3 g/dL (ref 3.8–4.8)
Alkaline Phosphatase: 79 [IU]/L (ref 44–121)
Bilirubin Total: 0.8 mg/dL (ref 0.0–1.2)
Bilirubin, Direct: 0.2 mg/dL (ref 0.00–0.40)
Total Protein: 6.5 g/dL (ref 6.0–8.5)

## 2023-09-05 ENCOUNTER — Telehealth (HOSPITAL_BASED_OUTPATIENT_CLINIC_OR_DEPARTMENT_OTHER): Payer: Self-pay

## 2023-09-05 NOTE — Telephone Encounter (Addendum)
Results called to patient who verbalizes understanding!    ----- Message from Andrew Joseph sent at 09/04/2023  7:01 PM EDT ----- Normal liver function. Good result!

## 2023-09-08 ENCOUNTER — Encounter (HOSPITAL_BASED_OUTPATIENT_CLINIC_OR_DEPARTMENT_OTHER): Payer: Self-pay | Admitting: Cardiology

## 2023-09-08 ENCOUNTER — Ambulatory Visit (HOSPITAL_BASED_OUTPATIENT_CLINIC_OR_DEPARTMENT_OTHER): Payer: Medicare PPO | Admitting: Cardiology

## 2023-09-08 VITALS — BP 178/68 | HR 56 | Ht 68.0 in | Wt 174.0 lb

## 2023-09-08 DIAGNOSIS — I10 Essential (primary) hypertension: Secondary | ICD-10-CM

## 2023-09-08 DIAGNOSIS — I4892 Unspecified atrial flutter: Secondary | ICD-10-CM

## 2023-09-08 DIAGNOSIS — I480 Paroxysmal atrial fibrillation: Secondary | ICD-10-CM

## 2023-09-08 DIAGNOSIS — Z7901 Long term (current) use of anticoagulants: Secondary | ICD-10-CM

## 2023-09-08 DIAGNOSIS — I2510 Atherosclerotic heart disease of native coronary artery without angina pectoris: Secondary | ICD-10-CM | POA: Diagnosis not present

## 2023-09-08 DIAGNOSIS — D6869 Other thrombophilia: Secondary | ICD-10-CM | POA: Diagnosis not present

## 2023-09-08 DIAGNOSIS — Z955 Presence of coronary angioplasty implant and graft: Secondary | ICD-10-CM | POA: Diagnosis not present

## 2023-09-08 NOTE — Patient Instructions (Addendum)
Medication Instructions:  Your physician recommends that you continue on your current medications as directed. Please refer to the Current Medication list given to you today.   Follow-Up: At [facility], you and your health needs are our priority.  As part of our continuing mission to provide you with exceptional heart care, we have created designated Provider Care Teams.  These Care Teams include your primary Cardiologist (physician) and Advanced Practice Providers (APPs -  Physician Assistants and Nurse Practitioners) who all work together to provide you with the care you need, when you need it.  We recommend signing up for the patient portal called "MyChart".  Sign up information is provided on this After Visit Summary.  MyChart is used to connect with patients for Virtual Visits (Telemedicine).  Patients are able to view lab/test results, encounter notes, upcoming appointments, etc.  Non-urgent messages can be sent to your provider as well.   To learn more about what you can do with MyChart, go to [email or web address].    Your next appointment:   Follow up scheduled   Other Instructions how to check blood pressure:  -sit comfortably in a chair, feet uncrossed and flat on floor, for 5-10 minutes  -arm ideally should rest at the level of the heart. However, arm should be relaxed and not tense (for example, do not hold the arm up unsupported)  -avoid exercise, caffeine, and tobacco for at least 30 minutes prior to BP reading  -don't take BP cuff reading over clothes (always place on skin directly)  -I like upper arm BP cuffs over wrist cuffs. Omron is a good brand but most arm cuffs work well.

## 2023-09-08 NOTE — Patient Instructions (Signed)
Look into upper arm type blood pressure cuffs (Omron is a good brand, but there are other brands too that are good). Start checking at home and keeping a log. If you take medicine at night, it's ok to check in the morning once you've sat quietly for a time.  If they are consistently more than 150 on the top number, call us.  how to check blood pressure:  -sit comfortably in a chair, feet uncrossed and flat on floor, for 5-10 minutes  -arm ideally should rest at the level of the heart. However, arm should be relaxed and not tense (for example, do not hold the arm up unsupported)  -avoid exercise, caffeine, and tobacco for at least 30 minutes prior to BP reading  -don't take BP cuff reading over clothes (always place on skin directly)  -I prefer to know how well the medication is working, so I would like you to take your readings 1-2 hours after taking your blood pressure medication if possible

## 2023-09-08 NOTE — Progress Notes (Signed)
Cardiology Office Note:  .    Date:  09/08/2023  ID:  Andrew Buffalo., DOB 01-20-1946, MRN [id number] PCP: Tresa Garter, MD  [facility] Providers Cardiologist:  Jodelle Red, MD Electrophysiologist:  Maurice Small, MD     History of Present Illness: .    Andrew Martineau. is a 77 y.o. male with a hx of CAD with remote stent, paroxysmal atrial fibrillation/flutter who is seen for follow up. I initially met him 09/07/2019 as a new consult at the request of [name], Georgina Quint, MD for the evaluation and management of CAD.   CV history: distal LAD stent, last cath October 25, 2011 Father died of MI age 53. Hypercholesterolemia: prior LDL 124. Was told in the past his total cholesterol was over 400.    Cardiovascular risk factors: Prior clinical ASCVD: CAD with PCI to LAD. ?CVA in October 24, 2006 (quarter sized bleed in his brain--lightheadedness/dizziness), unclear ?seizure event 10-25-2011 Comorbid conditions: hypertension, hyperlipidemia Prior cardiac testing: cath 2010, 2012. Echo 10/24/2014. MPI 1997, 200011/23/0211-23-2003   Arrhythmia history: He established care with EP Dr. Nelly Laurence 03/14/23. He was scheduled for ablation in September. In the interim he was started on amiodarone underwent cardioversion. He cancelled ablation that was scheduled for 08/07/23.  Today: Overall feeling well. Has questions regarding long term plan for amiodarone. We discussed tikosyn pros/cons and briefly discussed other antiarrhythmics, as well as rate vs rhythm control strategy. He cancelled his ablation because he read about risk/reward and was concerned about long term durability as well as the risk of the procedure.   Also discussed his elevated blood pressure. He is very concerned about his kidneys, discussed at length. No severe swelling requiring lasix, has occasional mild LE tightness/swelling.    ROS:  Please see the history of present illness. ROS otherwise negative except as noted.   Studies Reviewed:  [name]       No new today  Physical Exam:    VS:  BP (!) 180/80   Pulse (!) 56   Ht 5\' 8"  (1.727 m)   Wt 174 lb (78.9 kg)   BMI 26.46 kg/m    Wt Readings from Last 3 Encounters:  09/08/23 174 lb (78.9 kg)  07/18/23 174 lb 6.4 oz (79.1 kg)  07/07/23 178 lb (80.7 kg)    GEN: Well nourished, well developed in no acute distress HEENT: Normal, moist mucous membranes NECK: No JVD CARDIAC: regular rhythm, normal S1 and S2, no rubs or gallops. No murmur. VASCULAR: Radial and DP pulses 2+ bilaterally. No carotid bruits RESPIRATORY:  Clear to auscultation without rales, wheezing or rhonchi  ABDOMEN: Soft, non-tender, non-distended MUSCULOSKELETAL:  Ambulates independently SKIN: Warm and dry, no edema NEUROLOGIC:  Alert and oriented x 3. No focal neuro deficits noted. PSYCHIATRIC:  Normal affect    ASSESSMENT AND PLAN: .    Atrial flutter/atrial fibrillation -CHA2DS2/VAS Stroke Risk Points= 4  -no bleeding issues, continue apixaban -tolerating amiodarone. Was planned for ablation in Sept but elected to pursue medical therapy. We discussed antiarrhythmics at length today. For now, continue amiodarone. -lasix/potassium PRN as he has swelling when in afib   CAD with history of prior stent Hypercholesterolemia -asymptomatic.  -stopped aspirin with start of apixaban -continue rosuvastatin 20 mg daily -LDL goal <70, last 112, patient preference: declines PCSK9i/additional therapy -reviewed red flag warning signs that need immediate medical attention   Hypertension: Overall goal <130/80 -typically elevated on initial reading here but improves on recheck. Remained elevated on on recheck  today -continue irbesartan-HCTZ 150-12.5 mg daily. We discussed that taking this at night may exacerbate nocturia, but he feels this is manageable and current regimen is working well -trialed one dose of carvedilol, BP not well controlled, returned to labetalol -continue labetalol 300 mg BID -discussed home  BP monitoring today -has been on amlodipine in the past if additional agent needed in the future. Alternative would be to increase irbesartan-HCTZ dose to maximum dosing.    Cardiac risk counseling and prevention recommendations: -recommend heart healthy/Mediterranean diet, with whole grains, fruits, vegetable, fish, lean meats, nuts, and olive oil. Limit salt. -recommend moderate walking, 3-5 times/week for 30-50 minutes each session. Aim for at least 150 minutes.week. Goal should be pace of 3 miles/hours, or walking 1.5 miles in 30 minutes -recommend avoidance of tobacco products. Avoid excess alcohol.  Dispo: Follow-up in 3 months, or sooner as needed.  Total time of encounter: 40 minutes total time of encounter, including 31 minutes spent in face-to-face patient care. This time includes coordination of care and counseling regarding above conditions. Remainder of non-face-to-face time involved reviewing chart documents/testing relevant to the patient encounter and documentation in the medical record.  Jodelle Red, MD, PhD, [facility] [place]  [facility]    Signed, Jodelle Red, MD

## 2023-09-17 ENCOUNTER — Other Ambulatory Visit: Payer: Self-pay | Admitting: Internal Medicine

## 2023-09-20 ENCOUNTER — Other Ambulatory Visit: Payer: Self-pay | Admitting: Internal Medicine

## 2023-10-02 ENCOUNTER — Ambulatory Visit: Payer: Medicare PPO

## 2023-10-02 ENCOUNTER — Other Ambulatory Visit (INDEPENDENT_AMBULATORY_CARE_PROVIDER_SITE_OTHER): Payer: Medicare PPO

## 2023-10-02 DIAGNOSIS — R809 Proteinuria, unspecified: Secondary | ICD-10-CM | POA: Diagnosis not present

## 2023-10-02 LAB — COMPREHENSIVE METABOLIC PANEL
ALT: 22 U/L (ref 0–53)
AST: 22 U/L (ref 0–37)
Albumin: 4.1 g/dL (ref 3.5–5.2)
Alkaline Phosphatase: 69 U/L (ref 39–117)
BUN: 15 mg/dL (ref 6–23)
CO2: 32 meq/L (ref 19–32)
Calcium: 9.3 mg/dL (ref 8.4–10.5)
Chloride: 102 meq/L (ref 96–112)
Creatinine, Ser: 1.23 mg/dL (ref 0.40–1.50)
GFR: 56.26 mL/min — ABNORMAL LOW (ref >60.00)
Glucose, Bld: 95 mg/dL (ref 70–99)
Potassium: 3.5 meq/L (ref 3.5–5.1)
Sodium: 141 meq/L (ref 135–145)
Total Bilirubin: 0.8 mg/dL (ref 0.2–1.2)
Total Protein: 6.8 g/dL (ref 6.0–8.3)

## 2023-10-02 LAB — MICROALBUMIN / CREATININE URINE RATIO
Creatinine,U: 58.2 mg/dL
Microalb Creat Ratio: 4.3 mg/g (ref 0.0–30.0)
Microalb, Ur: 2.5 mg/dL — ABNORMAL HIGH (ref 0.0–1.9)

## 2023-10-06 ENCOUNTER — Ambulatory Visit (INDEPENDENT_AMBULATORY_CARE_PROVIDER_SITE_OTHER): Payer: Medicare PPO | Admitting: Internal Medicine

## 2023-10-06 ENCOUNTER — Encounter: Payer: Self-pay | Admitting: Internal Medicine

## 2023-10-06 VITALS — BP 118/70 | HR 65 | Temp 98.5°F | Ht 68.0 in | Wt 174.0 lb

## 2023-10-06 DIAGNOSIS — I4892 Unspecified atrial flutter: Secondary | ICD-10-CM

## 2023-10-06 DIAGNOSIS — Z0000 Encounter for general adult medical examination without abnormal findings: Secondary | ICD-10-CM

## 2023-10-06 DIAGNOSIS — I482 Chronic atrial fibrillation, unspecified: Secondary | ICD-10-CM

## 2023-10-06 DIAGNOSIS — I10 Essential (primary) hypertension: Secondary | ICD-10-CM | POA: Diagnosis not present

## 2023-10-06 DIAGNOSIS — Z8546 Personal history of malignant neoplasm of prostate: Secondary | ICD-10-CM

## 2023-10-06 NOTE — Assessment & Plan Note (Signed)
Will monitor PSA 

## 2023-10-06 NOTE — Progress Notes (Signed)
Subjective:  Patient ID: Andrew Buffalo., male    DOB: 1946/05/06  Age: 77 y.o. MRN: [id number]  CC: Annual Exam   HPI Andrew Schlitt. presents for a well exam  Outpatient Medications Prior to Visit  Medication Sig Dispense Refill   amiodarone (PACERONE) 200 MG tablet Take 200 mg by mouth daily.     ELIQUIS 5 MG TABS tablet TAKE 1 TABLET BY MOUTH TWICE DAILY 180 tablet 2   irbesartan-hydrochlorothiazide (AVALIDE) 150-12.5 MG tablet TAKE 1 TABLET BY MOUTH DAILY 90 tablet 3   labetalol (NORMODYNE) 300 MG tablet Take 1 tablet (300 mg total) by mouth 2 (two) times daily. 180 tablet 3   rosuvastatin (CRESTOR) 20 MG tablet TAKE 1 TABLET BY MOUTH DAILY 90 tablet 1   zolpidem (AMBIEN) 10 MG tablet TAKE 1 TABLET BY MOUTH AT BEDTIME 90 tablet 1   diltiazem (CARDIZEM) 30 MG tablet Take up to twice daily for heart rates greater than 90bpm. (Patient not taking: Reported on 09/08/2023) 60 tablet 1   furosemide (LASIX) 20 MG tablet Take 1 tablet (20 mg total) by mouth daily as needed. (Patient not taking: Reported on 09/08/2023) 30 tablet 6   potassium chloride SA (KLOR-CON M) 20 MEQ tablet Take 1 tablet (20 mEq total) by mouth daily as needed (when you take lasix). (Patient not taking: Reported on 09/08/2023) 90 tablet 3   triamcinolone cream (KENALOG) 0.5 % APPLY TOPICALLY 3 TIMES DAILY AS NEEDED (IRRITATION) (Patient not taking: Reported on 09/08/2023) 60 g 1   No facility-administered medications prior to visit.    ROS: Review of Systems  Constitutional:  Negative for appetite change, fatigue and unexpected weight change.  HENT:  Negative for congestion, nosebleeds, sneezing, sore throat and trouble swallowing.   Eyes:  Negative for itching and visual disturbance.  Respiratory:  Negative for cough.   Cardiovascular:  Negative for chest pain, palpitations and leg swelling.  Gastrointestinal:  Negative for abdominal distention, blood in stool, diarrhea and nausea.  Genitourinary:  Negative  for frequency and hematuria.  Musculoskeletal:  Negative for back pain, gait problem, joint swelling and neck pain.  Skin:  Negative for rash.  Neurological:  Negative for dizziness, tremors, speech difficulty and weakness.  Psychiatric/Behavioral:  Negative for agitation, dysphoric mood and sleep disturbance. The patient is not nervous/anxious.     Objective:  BP 118/70 (BP Location: Left Arm, Patient Position: Sitting, Cuff Size: Normal)   Pulse 65   Temp 98.5 F (36.9 C) (Oral)   Ht 5\' 8"  (1.727 m)   Wt 174 lb (78.9 kg)   SpO2 95%   BMI 26.46 kg/m   BP Readings from Last 3 Encounters:  10/06/23 118/70  09/08/23 (!) 178/68  07/18/23 (!) 160/70    Wt Readings from Last 3 Encounters:  10/06/23 174 lb (78.9 kg)  09/08/23 174 lb (78.9 kg)  07/18/23 174 lb 6.4 oz (79.1 kg)    Physical Exam Constitutional:      General: He is not in acute distress.    Appearance: He is well-developed.     Comments: NAD  Eyes:     Conjunctiva/sclera: Conjunctivae normal.     Pupils: Pupils are equal, round, and reactive to light.  Neck:     Thyroid: No thyromegaly.     Vascular: No JVD.  Cardiovascular:     Rate and Rhythm: Normal rate and regular rhythm.     Heart sounds: Normal heart sounds. No murmur heard.    No  friction rub. No gallop.  Pulmonary:     Effort: Pulmonary effort is normal. No respiratory distress.     Breath sounds: Normal breath sounds. No wheezing or rales.  Chest:     Chest wall: No tenderness.  Abdominal:     General: Bowel sounds are normal. There is no distension.     Palpations: Abdomen is soft. There is no mass.     Tenderness: There is no abdominal tenderness. There is no guarding or rebound.  Musculoskeletal:        General: No tenderness. Normal range of motion.     Cervical back: Normal range of motion.  Lymphadenopathy:     Cervical: No cervical adenopathy.  Skin:    General: Skin is warm and dry.     Findings: No rash.  Neurological:      Mental Status: He is alert and oriented to person, place, and time.     Cranial Nerves: No cranial nerve deficit.     Motor: No abnormal muscle tone.     Coordination: Coordination normal.     Gait: Gait normal.     Deep Tendon Reflexes: Reflexes are normal and symmetric.  Psychiatric:        Behavior: Behavior normal.        Thought Content: Thought content normal.        Judgment: Judgment normal.     Lab Results  Component Value Date   WBC 4.0 07/18/2023   HGB 14.4 07/18/2023   HCT 42.4 07/18/2023   PLT 120 (L) 07/18/2023   GLUCOSE 95 10/02/2023   CHOL 164 03/31/2023   TRIG 49.0 03/31/2023   HDL 42.40 03/31/2023   LDLDIRECT 296.1 08/12/2013   LDLCALC 112 (H) 03/31/2023   ALT 22 10/02/2023   AST 22 10/02/2023   NA 141 10/02/2023   K 3.5 10/02/2023   CL 102 10/02/2023   CREATININE 1.23 10/02/2023   BUN 15 10/02/2023   CO2 32 10/02/2023   TSH 3.240 04/14/2023   PSA 0.00 (L) 03/31/2023   INR 0.99 07/31/2011   HGBA1C 5.8 03/09/2014   MICROALBUR 2.5 (H) 10/02/2023    No results found.  Assessment & Plan:   Problem List Items Addressed This Visit     Essential hypertension     On Labetalol 300 mg bid and Avalide      Relevant Orders   Comprehensive metabolic panel   TSH   PROSTATE CANCER, HX OF    Will monitor PSA      Well adult exam - Primary    We discussed age appropriate health related issues, including available/recomended screening tests and vaccinations. Labs were ordered to be later reviewed . All questions were answered. We discussed one or more of the following - seat belt use, use of sunscreen/sun exposure exercise, fall risk reduction, second hand smoke exposure, firearm use and storage, seat belt use, a need for adhering to healthy diet and exercise. Labs were ordered.  All questions were answered.  Colon due 2021 Dr Russella Dar. Dr Jason Fila [facility]) did sigm in 01/2021 - ok. Pt declined other GI tests Pt declined all shots      Atrial flutter (HCC)     In NSR now      Atrial fibrillation (HCC)    In NSR      Relevant Orders   TSH      No orders of the defined types were placed in this encounter.     Follow-up: Return in about 6  months (around 04/04/2024) for a follow-up visit.  Sonda Primes, MD

## 2023-10-06 NOTE — Assessment & Plan Note (Signed)
We discussed age appropriate health related issues, including available/recomended screening tests and vaccinations. Labs were ordered to be later reviewed . All questions were answered. We discussed one or more of the following - seat belt use, use of sunscreen/sun exposure exercise, fall risk reduction, second hand smoke exposure, firearm use and storage, seat belt use, a need for adhering to healthy diet and exercise. Labs were ordered.  All questions were answered.  Colon due 2021 Dr Russella Dar. Dr Jason Fila [facility]) did sigm in 01/2021 - ok. Pt declined other GI tests Pt declined all shots

## 2023-10-06 NOTE — Assessment & Plan Note (Signed)
In NSR now 

## 2023-10-06 NOTE — Assessment & Plan Note (Signed)
In NSR 

## 2023-10-06 NOTE — Assessment & Plan Note (Signed)
On Labetalol 300 mg bid and Avalide

## 2023-10-14 ENCOUNTER — Other Ambulatory Visit: Payer: Self-pay

## 2023-10-14 ENCOUNTER — Encounter: Payer: Self-pay | Admitting: Emergency Medicine

## 2023-10-14 ENCOUNTER — Ambulatory Visit
Admission: EM | Admit: 2023-10-14 | Discharge: 2023-10-14 | Disposition: A | Discharge status: Home / Self Care | Payer: Medicare PPO | Attending: Internal Medicine | Admitting: Internal Medicine

## 2023-10-14 DIAGNOSIS — J069 Acute upper respiratory infection, unspecified: Secondary | ICD-10-CM | POA: Diagnosis not present

## 2023-10-14 DIAGNOSIS — Z1152 Encounter for screening for COVID-19: Secondary | ICD-10-CM | POA: Diagnosis not present

## 2023-10-14 LAB — POC COVID19/FLU A&B COMBO
Covid Antigen, POC: NEGATIVE
Influenza A Antigen, POC: NEGATIVE
Influenza B Antigen, POC: NEGATIVE

## 2023-10-14 MED ORDER — BENZONATATE 100 MG PO CAPS: 100.0000 mg | ORAL_CAPSULE | Freq: Three times a day (TID) | ORAL | 0 refills | Status: DC | PRN

## 2023-10-14 MED ORDER — FLUTICASONE PROPIONATE 50 MCG/ACT NA SUSP: 1.0000 | Freq: Every day | NASAL | 0 refills | Status: AC

## 2023-10-14 NOTE — ED Triage Notes (Signed)
Pt here for cough and congestion x 4 days 

## 2023-10-14 NOTE — ED Provider Notes (Addendum)
[facility]    CSN: [id number] Arrival date & time: 10/14/23  1021      History   Chief Complaint Chief Complaint  Patient presents with   Cough    HPI Andrew Joseph. is a 77 y.o. male.   Patient presents with approximately 4-day history of cough and nasal congestion.  Denies any fever.  Denies any obvious known sick contacts but reports that he eats dinner at a restaurant every night so is around several people frequently.  Denies chest pain or shortness of breath.  Denies history of asthma or COPD.  He has taken over-the-counter medication with minimal improvement in symptoms.   Cough   Past Medical History:  Diagnosis Date   Allergic rhinitis, cause unspecified    Allergy    SEASONAL   Anxiety    Blood in stool    BPH (benign prostatic hyperplasia)    CAD (coronary artery disease)    DES mid lad 08/15/2009   Cancer (HCC) 2009   prostate   Chest pain, unspecified    Colon polyps    CVA (cerebrovascular accident due to intracerebral hemorrhage) (HCC) 2007 Spring   History of prostate cancer 2009   Dr Annabell Howells   HTN (hypertension)    Hypercholesterolemia    Hyperlipidemia    Hypokalemia    Insomnia, persistent    Neoplasm of uncertain behavior of skin    Stroke [facility])    2007   Syncope    Tubular adenoma of colon 2010    Patient Active Problem List   Diagnosis Date Noted   Urine test positive for microalbuminuria 07/16/2023   Abnormal serum creatinine level 07/07/2023   Wart 04/01/2023   Shoulder pain, right 04/01/2023   Atrial fibrillation (HCC) 03/14/2023   Encounter for disability determination 07/15/2022   Callus 03/29/2022   Colon polyps 12/25/2021   Atherosclerosis of aorta (HCC) 08/23/2021   Atrial flutter (HCC) 03/19/2021   Stress 03/15/2021   Rash 10/17/2020   History of coronary angioplasty with insertion of stent 09/29/2020   Pain and swelling of right lower leg 06/14/2020   Urinary incontinence 03/15/2020   Actinic  keratosis 10/21/2019   Skin lesion of neck 10/14/2019   Leg cramp 07/04/2019   DOE (dyspnea on exertion) 08/31/2018   Cough 10/11/2014   Thrush 10/11/2014   Ocular migraine 07/07/2014   Vision, loss, sudden 07/07/2014   Shoulder pain, left 07/07/2014   Well adult exam 03/11/2014   Hypokalemia 09/24/2013   Hemorrhoids, internal, with bleeding and grade 2 prolapse 08/12/2013   Thrombocytopenia, unspecified (HCC) 08/12/2013   Seizure (HCC) 07/30/2011   Erectile dysfunction 06/25/2011   Abnormal CBC 09/13/2009   NEOPLASM, SKIN, UNCERTAIN BEHAVIOR 02/22/2009   Insomnia 02/22/2009   Dyslipidemia 11/14/2008   Essential hypertension 11/14/2008   Coronary artery disease involving native coronary artery of native heart without angina pectoris 11/14/2008   Allergic rhinitis 11/14/2008   PROSTATE CANCER, HX OF 11/14/2008    Past Surgical History:  Procedure Laterality Date   ANGIOPLASTY     with stents   CARDIOVERSION N/A 04/13/2021   Procedure: CARDIOVERSION;  Surgeon: Chrystie Nose, MD;  Location: [facility] ENDOSCOPY;  Service: Cardiovascular;  Laterality: N/A;   CARDIOVERSION N/A 01/21/2023   Procedure: CARDIOVERSION;  Surgeon: Meriam Sprague, MD;  Location: [facility] ENDOSCOPY;  Service: Cardiovascular;  Laterality: N/A;   CARDIOVERSION N/A 03/28/2023   Procedure: CARDIOVERSION;  Surgeon: Wendall Stade, MD;  Location: [facility] INVASIVE CV LAB;  Service: Cardiovascular;  Laterality: N/A;   COLONOSCOPY  2016   Russella Dar, TA's   POLYPECTOMY     PROSTATECTOMY  2009   TONSILLECTOMY         Home Medications    Prior to Admission medications   Medication Sig Start Date End Date Taking? Authorizing Provider  benzonatate (TESSALON) 100 MG capsule Take 1 capsule (100 mg total) by mouth every 8 (eight) hours as needed for cough. 10/14/23  Yes Adaeze Better, Rolly Salter E, FNP  fluticasone (FLONASE) 50 MCG/ACT nasal spray Place 1 spray into both nostrils daily. 10/14/23  Yes Rajah Lamba, Rolly Salter E, FNP  amiodarone  (PACERONE) 200 MG tablet Take 200 mg by mouth daily.    [provider]  ELIQUIS 5 MG TABS tablet TAKE 1 TABLET BY MOUTH TWICE DAILY 06/09/23   [name], Georgina Quint, MD  irbesartan-hydrochlorothiazide (AVALIDE) 150-12.5 MG tablet TAKE 1 TABLET BY MOUTH DAILY 09/17/23   [name], Georgina Quint, MD  labetalol (NORMODYNE) 300 MG tablet Take 1 tablet (300 mg total) by mouth 2 (two) times daily. 03/11/23   Alver Sorrow, NP  rosuvastatin (CRESTOR) 20 MG tablet TAKE 1 TABLET BY MOUTH DAILY 05/12/23   [name], Georgina Quint, MD  zolpidem (AMBIEN) 10 MG tablet TAKE 1 TABLET BY MOUTH AT BEDTIME 09/22/23   [name], Georgina Quint, MD    Family History Family History  Problem Relation Age of Onset   Stroke Mother    Heart disease Father        MI   Diabetes Brother        Obese   Colon polyps Neg Hx    Esophageal cancer Neg Hx    Rectal cancer Neg Hx    Stomach cancer Neg Hx    Colon cancer Neg Hx     Social History Social History   Tobacco Use   Smoking status: Never   Smokeless tobacco: Never  Vaping Use   Vaping status: Never Used  Substance Use Topics   Alcohol use: Yes    Comment: glass of wine- occ  per pt - occ mixed drink    Drug use: No     Allergies   Amlodipine, Iodine, and Shellfish allergy   Review of Systems Review of Systems Per HPI  Physical Exam Triage Vital Signs ED Triage Vitals  Encounter Vitals Group     BP 10/14/23 1106 116/62     Systolic BP Percentile --      Diastolic BP Percentile --      Pulse Rate 10/14/23 1106 61     Resp 10/14/23 1106 18     Temp 10/14/23 1106 99.2 F (37.3 C)     Temp Source 10/14/23 1106 Oral     SpO2 10/14/23 1106 93 %     Weight --      Height --      Head Circumference --      Peak Flow --      Pain Score 10/14/23 1107 3     Pain Loc --      Pain Education --      Exclude from Growth Chart --    No data found.  Updated Vital Signs BP 116/62 (BP Location: Left Arm)   Pulse 61   Temp 99.2 F (37.3 C)  (Oral)   Resp 18   SpO2 93%   Visual Acuity Right Eye Distance:   Left Eye Distance:   Bilateral Distance:    Right Eye Near:   Left Eye Near:  Bilateral Near:     Physical Exam Constitutional:      General: He is not in acute distress.    Appearance: Normal appearance. He is not toxic-appearing or diaphoretic.  HENT:     Head: Normocephalic and atraumatic.     Right Ear: Tympanic membrane and ear canal normal.     Left Ear: Tympanic membrane and ear canal normal.     Nose: Congestion present.     Mouth/Throat:     Mouth: Mucous membranes are moist.     Pharynx: Posterior oropharyngeal erythema present.  Eyes:     Extraocular Movements: Extraocular movements intact.     Conjunctiva/sclera: Conjunctivae normal.     Pupils: Pupils are equal, round, and reactive to light.  Cardiovascular:     Rate and Rhythm: Normal rate and regular rhythm.     Pulses: Normal pulses.     Heart sounds: Normal heart sounds.  Pulmonary:     Effort: Pulmonary effort is normal. No respiratory distress.     Breath sounds: Normal breath sounds. No stridor. No wheezing, rhonchi or rales.  Abdominal:     General: Abdomen is flat. Bowel sounds are normal.     Palpations: Abdomen is soft.  Musculoskeletal:        General: Normal range of motion.     Cervical back: Normal range of motion.  Skin:    General: Skin is warm and dry.  Neurological:     General: No focal deficit present.     Mental Status: He is alert and oriented to person, place, and time. Mental status is at baseline.  Psychiatric:        Mood and Affect: Mood normal.        Behavior: Behavior normal.      UC Treatments / Results  Labs (all labs ordered are listed, but only abnormal results are displayed) Labs Reviewed  SARS CORONAVIRUS 2 (TAT 6-24 HRS)  POC COVID19/FLU A&B COMBO    EKG   Radiology No results found.  Procedures Procedures (including critical care time)  Medications Ordered in UC Medications -  No data to display  Initial Impression / Assessment and Plan / UC Course  I have reviewed the triage vital signs and the nursing notes.  Pertinent labs & imaging results that were available during my care of the patient were reviewed by me and considered in my medical decision making (see chart for details).     Patient presents with symptoms likely from a viral upper respiratory infection.  Do not suspect underlying cardiopulmonary process. Symptoms seem unlikely related to ACS, CHF or COPD exacerbations, pneumonia, pneumothorax. Patient is nontoxic appearing and not in need of emergent medical intervention.  Rapid flu and rapid COVID test negative.  COVID PCR pending. There are no adventitious lung sounds on exam and oxygen is sustaining at 95 percent so do not think that emergent evaluation or chest imaging is necessary.   Recommended symptom control with medications, supportive care, fluids, rest.  Return if symptoms fail to improve in 1-2 weeks or you develop shortness of breath, chest pain, severe headache. Patient states understanding and is agreeable.  Discharged with PCP followup.  Final Clinical Impressions(s) / UC Diagnoses   Final diagnoses:  Viral upper respiratory tract infection with cough     Discharge Instructions      Suspect that you have a viral illness as we discussed.  I have prescribed you 2 medications to help with symptoms.  Follow-up if any  symptoms persist or worsen.    ED Prescriptions     Medication Sig Dispense Auth. Provider   benzonatate (TESSALON) 100 MG capsule Take 1 capsule (100 mg total) by mouth every 8 (eight) hours as needed for cough. 21 capsule [place], Chalfant E, [place]   fluticasone [facility]) 50 MCG/ACT nasal spray Place 1 spray into both nostrils daily. 16 g Gustavus Bryant, [place]      PDMP not reviewed this encounter.   Gustavus Bryant, [place] 10/14/23 1232    Gustavus Bryant, [place] 10/14/23 1233

## 2023-10-14 NOTE — Discharge Instructions (Signed)
Suspect that you have a viral illness as we discussed.  I have prescribed you 2 medications to help with symptoms.  Follow-up if any symptoms persist or worsen.

## 2023-10-15 LAB — SARS CORONAVIRUS 2 (TAT 6-24 HRS): SARS Coronavirus 2: NEGATIVE

## 2023-11-03 ENCOUNTER — Other Ambulatory Visit: Payer: Self-pay | Admitting: Internal Medicine

## 2023-11-06 ENCOUNTER — Encounter: Payer: Self-pay | Admitting: Internal Medicine

## 2023-11-06 ENCOUNTER — Ambulatory Visit (INDEPENDENT_AMBULATORY_CARE_PROVIDER_SITE_OTHER): Payer: Medicare PPO

## 2023-11-06 ENCOUNTER — Ambulatory Visit: Payer: Medicare PPO | Admitting: Internal Medicine

## 2023-11-06 VITALS — BP 130/78 | HR 53 | Temp 98.6°F | Ht 68.0 in | Wt 166.0 lb

## 2023-11-06 DIAGNOSIS — I517 Cardiomegaly: Secondary | ICD-10-CM | POA: Diagnosis not present

## 2023-11-06 DIAGNOSIS — J0100 Acute maxillary sinusitis, unspecified: Secondary | ICD-10-CM

## 2023-11-06 DIAGNOSIS — L570 Actinic keratosis: Secondary | ICD-10-CM

## 2023-11-06 DIAGNOSIS — J40 Bronchitis, not specified as acute or chronic: Secondary | ICD-10-CM | POA: Diagnosis not present

## 2023-11-06 DIAGNOSIS — R059 Cough, unspecified: Secondary | ICD-10-CM | POA: Diagnosis not present

## 2023-11-06 DIAGNOSIS — I482 Chronic atrial fibrillation, unspecified: Secondary | ICD-10-CM | POA: Diagnosis not present

## 2023-11-06 DIAGNOSIS — R634 Abnormal weight loss: Secondary | ICD-10-CM

## 2023-11-06 DIAGNOSIS — S2241XA Multiple fractures of ribs, right side, initial encounter for closed fracture: Secondary | ICD-10-CM | POA: Diagnosis not present

## 2023-11-06 DIAGNOSIS — J019 Acute sinusitis, unspecified: Secondary | ICD-10-CM | POA: Insufficient documentation

## 2023-11-06 DIAGNOSIS — R0989 Other specified symptoms and signs involving the circulatory and respiratory systems: Secondary | ICD-10-CM | POA: Diagnosis not present

## 2023-11-06 MED ORDER — CEFDINIR 300 MG PO CAPS: 300.0000 mg | ORAL_CAPSULE | Freq: Two times a day (BID) | ORAL | 0 refills | Status: DC

## 2023-11-06 MED ORDER — HYDROCODONE BIT-HOMATROP MBR 5-1.5 MG/5ML PO SOLN: 5.0000 mL | ORAL | 0 refills | Status: DC | PRN

## 2023-11-06 NOTE — Assessment & Plan Note (Signed)
  On diet  

## 2023-11-06 NOTE — Patient Instructions (Signed)
   Postprocedure instructions :     Keep the wounds clean. You can wash them with liquid soap and water. Pat dry with gauze or a Kleenex tissue  Before applying antibiotic ointment and a Band-Aid.   You need to report immediately  if  any signs of infection develop.    

## 2023-11-06 NOTE — Assessment & Plan Note (Signed)
New Omnicef x 10 d  

## 2023-11-06 NOTE — Progress Notes (Signed)
Subjective:  Patient ID: Andrew Buffalo., male    DOB: Jun 03, 1946  Age: 77 y.o. MRN: [id number]  CC: Medical Management of Chronic Issues (4 MNTH F/U, Pt is still having a tickle in his throat that is also causing him to cough./Discuss amiodarone (PACERONE) 200 MG tablet and Labetalol )  I would like to have him [place] clarified that what it comes from you and the prescription HPI Andrew Joseph. presents for a URI sx's x 10 d. Andrew Joseph went to UC -- (-) COVID, flu C/o tickle in the throat, green d/c. Pt lost appetite and wt... C/o A fib, a spot on the scalp  Outpatient Medications Prior to Visit  Medication Sig Dispense Refill   amiodarone (PACERONE) 200 MG tablet Take 200 mg by mouth daily.     benzonatate (TESSALON) 100 MG capsule Take 1 capsule (100 mg total) by mouth every 8 (eight) hours as needed for cough. 21 capsule 0   ELIQUIS 5 MG TABS tablet TAKE 1 TABLET BY MOUTH TWICE DAILY 180 tablet 2   fluticasone (FLONASE) 50 MCG/ACT nasal spray Place 1 spray into both nostrils daily. 16 g 0   irbesartan-hydrochlorothiazide (AVALIDE) 150-12.5 MG tablet TAKE 1 TABLET BY MOUTH DAILY 90 tablet 3   labetalol (NORMODYNE) 300 MG tablet Take 1 tablet (300 mg total) by mouth 2 (two) times daily. 180 tablet 3   rosuvastatin (CRESTOR) 20 MG tablet TAKE 1 TABLET BY MOUTH DAILY 90 tablet 1   zolpidem (AMBIEN) 10 MG tablet TAKE 1 TABLET BY MOUTH AT BEDTIME 90 tablet 1   No facility-administered medications prior to visit.    ROS: Review of Systems  Constitutional:  Positive for fatigue. Negative for appetite change and unexpected weight change.  HENT:  Positive for congestion and postnasal drip. Negative for nosebleeds, sneezing, sore throat and trouble swallowing.   Eyes:  Negative for itching and visual disturbance.  Respiratory:  Positive for cough.   Cardiovascular:  Negative for chest pain, palpitations and leg swelling.  Gastrointestinal:  Negative for abdominal distention, blood  in stool, diarrhea and nausea.  Genitourinary:  Negative for frequency and hematuria.  Musculoskeletal:  Negative for back pain, gait problem, joint swelling and neck pain.  Skin:  Negative for rash.  Neurological:  Negative for dizziness, tremors, speech difficulty and weakness.  Psychiatric/Behavioral:  Negative for agitation, dysphoric mood and sleep disturbance. The patient is not nervous/anxious.     Objective:  BP 130/78 (BP Location: Left Arm, Patient Position: Sitting, Cuff Size: Normal)   Pulse (!) 53   Temp 98.6 F (37 C) (Oral)   Ht 5\' 8"  (1.727 m)   Wt 166 lb (75.3 kg)   SpO2 95%   BMI 25.24 kg/m   BP Readings from Last 3 Encounters:  11/06/23 130/78  10/14/23 116/62  10/06/23 118/70    Wt Readings from Last 3 Encounters:  11/06/23 166 lb (75.3 kg)  10/06/23 174 lb (78.9 kg)  09/08/23 174 lb (78.9 kg)    Physical Exam Constitutional:      General: He is not in acute distress.    Appearance: Normal appearance. He is well-developed.     Comments: NAD  HENT:     Nose: Congestion and rhinorrhea present.     Mouth/Throat:     Pharynx: Posterior oropharyngeal erythema present.  Eyes:     Conjunctiva/sclera: Conjunctivae normal.     Pupils: Pupils are equal, round, and reactive to light.  Neck:  Thyroid: No thyromegaly.     Vascular: No JVD.  Cardiovascular:     Rate and Rhythm: Normal rate and regular rhythm.     Heart sounds: Normal heart sounds. No murmur heard.    No friction rub. No gallop.  Pulmonary:     Effort: Pulmonary effort is normal. No respiratory distress.     Breath sounds: Normal breath sounds. No wheezing or rales.  Chest:     Chest wall: No tenderness.  Abdominal:     General: Bowel sounds are normal. There is no distension.     Palpations: Abdomen is soft. There is no mass.     Tenderness: There is no abdominal tenderness. There is no guarding or rebound.  Musculoskeletal:        General: No tenderness. Normal range of motion.      Cervical back: Normal range of motion.  Lymphadenopathy:     Cervical: No cervical adenopathy.  Skin:    General: Skin is warm and dry.     Findings: No rash.  Neurological:     Mental Status: He is alert and oriented to person, place, and time.     Cranial Nerves: No cranial nerve deficit.     Motor: No abnormal muscle tone.     Coordination: Coordination normal.     Gait: Gait normal.     Deep Tendon Reflexes: Reflexes are normal and symmetric.  Psychiatric:        Behavior: Behavior normal.        Thought Content: Thought content normal.        Judgment: Judgment normal.   R TM w/fluid Eryth throat   AK on scalp x1   Procedure Note :     Procedure : Cryosurgery   Indication:  Actinic keratosis(es)   Risks including unsuccessful procedure , bleeding, infection, bruising, scar, a need for a repeat  procedure and others were explained to the patient in detail as well as the benefits. Informed consent was obtained verbally.   1  lesion(s)  on scalp   was/were treated with liquid nitrogen on a Q-tip in a usual fasion . Band-Aid was applied and antibiotic ointment was given for a later use.   Tolerated well. Complications none.   Postprocedure instructions :     Keep the wounds clean. You can wash them with liquid soap and water. Pat dry with gauze or a Kleenex tissue  Before applying antibiotic ointment and a Band-Aid.   You need to report immediately  if  any signs of infection develop.   Lab Results  Component Value Date   WBC 4.0 07/18/2023   HGB 14.4 07/18/2023   HCT 42.4 07/18/2023   PLT 120 (L) 07/18/2023   GLUCOSE 95 10/02/2023   CHOL 164 03/31/2023   TRIG 49.0 03/31/2023   HDL 42.40 03/31/2023   LDLDIRECT 296.1 08/12/2013   LDLCALC 112 (H) 03/31/2023   ALT 22 10/02/2023   AST 22 10/02/2023   NA 141 10/02/2023   K 3.5 10/02/2023   CL 102 10/02/2023   CREATININE 1.23 10/02/2023   BUN 15 10/02/2023   CO2 32 10/02/2023   TSH 3.240 04/14/2023   PSA  0.00 (L) 03/31/2023   INR 0.99 07/31/2011   HGBA1C 5.8 03/09/2014   MICROALBUR 2.5 (H) 10/02/2023    No results found.  Assessment & Plan:   Problem List Items Addressed This Visit     Actinic keratosis    See cryo  Atrial fibrillation (HCC)    In NSR on Amiodarone F/u w/Dr Cristal Deer in Jan 2025 Pt is re-considering ablation now      Relevant Orders   Ambulatory referral to Cardiology   Acute sinusitis - Primary    New Omnicef x 10 d      Relevant Medications   cefdinir (OMNICEF) 300 MG capsule   HYDROcodone bit-homatropine (HYCODAN) 5-1.5 MG/5ML syrup   Bronchitis    Get a CXR Omnicef x 10 d      Relevant Orders   DG Chest 2 View   Weight loss    On diet         Meds ordered this encounter  Medications   cefdinir (OMNICEF) 300 MG capsule    Sig: Take 1 capsule (300 mg total) by mouth 2 (two) times daily.    Dispense:  20 capsule    Refill:  0   HYDROcodone bit-homatropine (HYCODAN) 5-1.5 MG/5ML syrup    Sig: Take 5 mLs by mouth every 4 (four) hours as needed for cough.    Dispense:  240 mL    Refill:  0      Follow-up: Return in about 2 months (around 01/07/2024) for a follow-up visit.  Sonda Primes, MD

## 2023-11-06 NOTE — Assessment & Plan Note (Signed)
In NSR on Amiodarone F/u w/Dr Cristal Deer in Jan 2025 Pt is re-considering ablation now

## 2023-11-06 NOTE — Assessment & Plan Note (Signed)
See cryo 

## 2023-11-06 NOTE — Assessment & Plan Note (Signed)
Get a CXR Omnicef x 10 d

## 2023-12-26 ENCOUNTER — Encounter (HOSPITAL_BASED_OUTPATIENT_CLINIC_OR_DEPARTMENT_OTHER): Payer: Self-pay

## 2023-12-30 ENCOUNTER — Other Ambulatory Visit (HOSPITAL_BASED_OUTPATIENT_CLINIC_OR_DEPARTMENT_OTHER): Payer: Self-pay | Admitting: Family

## 2023-12-30 DIAGNOSIS — E785 Hyperlipidemia, unspecified: Secondary | ICD-10-CM | POA: Diagnosis not present

## 2023-12-30 DIAGNOSIS — Z79899 Other long term (current) drug therapy: Secondary | ICD-10-CM | POA: Diagnosis not present

## 2023-12-31 ENCOUNTER — Ambulatory Visit (HOSPITAL_BASED_OUTPATIENT_CLINIC_OR_DEPARTMENT_OTHER): Payer: Medicare PPO | Admitting: Cardiology

## 2023-12-31 ENCOUNTER — Encounter (HOSPITAL_BASED_OUTPATIENT_CLINIC_OR_DEPARTMENT_OTHER): Payer: Self-pay | Admitting: Cardiology

## 2023-12-31 VITALS — BP 132/64 | HR 53 | Resp 97 | Ht 68.0 in | Wt 173.8 lb

## 2023-12-31 DIAGNOSIS — Z7901 Long term (current) use of anticoagulants: Secondary | ICD-10-CM

## 2023-12-31 DIAGNOSIS — Z955 Presence of coronary angioplasty implant and graft: Secondary | ICD-10-CM | POA: Diagnosis not present

## 2023-12-31 DIAGNOSIS — D6869 Other thrombophilia: Secondary | ICD-10-CM

## 2023-12-31 DIAGNOSIS — I480 Paroxysmal atrial fibrillation: Secondary | ICD-10-CM | POA: Diagnosis not present

## 2023-12-31 DIAGNOSIS — I10 Essential (primary) hypertension: Secondary | ICD-10-CM | POA: Diagnosis not present

## 2023-12-31 DIAGNOSIS — E785 Hyperlipidemia, unspecified: Secondary | ICD-10-CM | POA: Diagnosis not present

## 2023-12-31 DIAGNOSIS — I2510 Atherosclerotic heart disease of native coronary artery without angina pectoris: Secondary | ICD-10-CM | POA: Diagnosis not present

## 2023-12-31 DIAGNOSIS — Z79899 Other long term (current) drug therapy: Secondary | ICD-10-CM

## 2023-12-31 LAB — COMPREHENSIVE METABOLIC PANEL
ALT: 26 [IU]/L (ref 0–44)
AST: 23 [IU]/L (ref 0–40)
Albumin: 3.9 g/dL (ref 3.8–4.8)
Alkaline Phosphatase: 90 [IU]/L (ref 44–121)
BUN/Creatinine Ratio: 14 (ref 10–24)
BUN: 15 mg/dL (ref 8–27)
Bilirubin Total: 0.8 mg/dL (ref 0.0–1.2)
CO2: 23 mmol/L (ref 20–29)
Calcium: 9 mg/dL (ref 8.6–10.2)
Chloride: 103 mmol/L (ref 96–106)
Creatinine, Ser: 1.07 mg/dL (ref 0.76–1.27)
Globulin, Total: 2.2 g/dL (ref 1.5–4.5)
Glucose: 94 mg/dL (ref 70–99)
Potassium: 3.3 mmol/L — ABNORMAL LOW (ref 3.5–5.2)
Sodium: 142 mmol/L (ref 134–144)
Total Protein: 6.1 g/dL (ref 6.0–8.5)
eGFR: 71 mL/min/{1.73_m2} (ref >59)

## 2023-12-31 LAB — LIPID PANEL
Chol/HDL Ratio: 3.8 {ratio} (ref 0.0–5.0)
Cholesterol, Total: 185 mg/dL (ref 100–199)
HDL: 49 mg/dL (ref >39)
LDL Chol Calc (NIH): 125 mg/dL — ABNORMAL HIGH (ref 0–99)
Triglycerides: 58 mg/dL (ref 0–149)
VLDL Cholesterol Cal: 11 mg/dL (ref 5–40)

## 2023-12-31 LAB — TSH: TSH: 2.44 u[IU]/mL (ref 0.450–4.500)

## 2023-12-31 NOTE — Progress Notes (Unsigned)
Cardiology Office Note:  .    Date:  12/31/2023  ID:  Andrew Buffalo., DOB 1946-05-16, MRN [id number] PCP: Tresa Garter, MD  [facility] Providers Cardiologist:  Jodelle Red, MD Electrophysiologist:  Maurice Small, MD     History of Present Illness: .    Andrew Joseph. is a 78 y.o. male with a hx of CAD with remote stent, paroxysmal atrial fibrillation/flutter who is seen for follow up. I initially met him 09/07/2019 as a new consult at the request of [name], Georgina Quint, MD for the evaluation and management of CAD.   CV history: distal LAD stent, last cath 01/07/11 Father died of MI age 48. Hypercholesterolemia: prior LDL 124. Was told in the past his total cholesterol was over 400.    Cardiovascular risk factors: Prior clinical ASCVD: CAD with PCI to LAD. ?CVA in 01/07/2006 (quarter sized bleed in his brain--lightheadedness/dizziness), unclear ?seizure event Jan 07, 2011 Comorbid conditions: hypertension, hyperlipidemia Prior cardiac testing: cath 2010, 2012. Echo 07-Jan-2014. MPI 1997, [id number] 06, 2003   Arrhythmia history: He established care with EP Dr. Nelly Laurence 03/14/23. He was scheduled for ablation in September. In the interim he was started on amiodarone underwent cardioversion. He cancelled ablation that was scheduled for 08/07/23.  Today: No current new cardiac concerns. Reviewed his recent labs today. Potassium borderline low. LFTs normal. TSH normal. Reviewed amiodarone again, would prefer to keep amiodarone as it is working and we continue to monitor his labs.  Initial BP elevated today, was stressful to get here.    ROS:  Please see the history of present illness. ROS otherwise negative except as noted.   Studies Reviewed: [name]       No new today  Physical Exam:    VS:  BP (!) 162/60   Pulse (!) 53   Resp (!) 97   Ht 5\' 8"  (1.727 m)   Wt 173 lb 12.8 oz (78.8 kg)   BMI 26.43 kg/m    Wt Readings from Last 3 Encounters:  12/31/23 173 lb 12.8 oz (78.8  kg)  11/06/23 166 lb (75.3 kg)  10/06/23 174 lb (78.9 kg)    GEN: Well nourished, well developed in no acute distress HEENT: Normal, moist mucous membranes NECK: No JVD CARDIAC: regular rhythm, normal S1 and S2, no rubs or gallops. No murmur. VASCULAR: Radial and DP pulses 2+ bilaterally. No carotid bruits RESPIRATORY:  Clear to auscultation without rales, wheezing or rhonchi  ABDOMEN: Soft, non-tender, non-distended MUSCULOSKELETAL:  Ambulates independently SKIN: Warm and dry, no edema NEUROLOGIC:  Alert and oriented x 3. No focal neuro deficits noted. PSYCHIATRIC:  Normal affect    ASSESSMENT AND PLAN: .    Atrial flutter/atrial fibrillation -CHA2DS2/VAS Stroke Risk Points= 4  -no bleeding issues, continue apixaban -tolerating amiodarone. Previously discussed ablation and other antiarrhythmics, after shared decision making will continue amiodarone -lasix/potassium PRN as he has swelling when in afib   CAD with history of prior stent Hypercholesterolemia -asymptomatic.  -stopped aspirin with start of apixaban -continue rosuvastatin 20 mg daily -LDL goal <70, last 125. We have discussed options for changing therapy/management. Per patient preference: declines PCSK9i/additional therapy -reviewed red flag warning signs that need immediate medical attention   Hypertension: Overall goal <130/80 -typically elevated on initial reading here but improves on recheck. Remained elevated on on recheck today -continue irbesartan-HCTZ 150-12.5 mg daily. We discussed that taking this at night may exacerbate nocturia, but he feels this is manageable and current regimen is working well -  trialed one dose of carvedilol, BP not well controlled, returned to labetalol -continue labetalol 300 mg BID -we discussed home BP monitoring -has been on amlodipine in the past if additional agent needed in the future. Alternative would be to increase irbesartan-HCTZ dose to maximum dosing.    Cardiac risk  counseling and prevention recommendations: -recommend heart healthy/Mediterranean diet, with whole grains, fruits, vegetable, fish, lean meats, nuts, and olive oil. Limit salt. -recommend moderate walking, 3-5 times/week for 30-50 minutes each session. Aim for at least 150 minutes.week. Goal should be pace of 3 miles/hours, or walking 1.5 miles in 30 minutes -recommend avoidance of tobacco products. Avoid excess alcohol.  Dispo: Follow-up in 3 months, or sooner as needed.  Jodelle Red, MD, PhD, [facility]  [facility]    Signed, Jodelle Red, MD  Jodelle Red, MD, PhD, [facility]  [facility]  [place]  [facility] & Vascular at [facility] at [facility] [address] [place], [place] [address] [phone]

## 2023-12-31 NOTE — Patient Instructions (Signed)
Medication Instructions:  Your physician recommends that you continue on your current medications as directed. Please refer to the Current Medication list given to you today.  *If you need a refill on your cardiac medications before your next appointment, please call your pharmacy*  Lab Work: NONE  Testing/Procedures: NONE  Follow-Up: At [facility], you and your health needs are our priority.  As part of our continuing mission to provide you with exceptional heart care, we have created designated Provider Care Teams.  These Care Teams include your primary Cardiologist (physician) and Advanced Practice Providers (APPs -  Physician Assistants and Nurse Practitioners) who all work together to provide you with the care you need, when you need it.  We recommend signing up for the patient portal called "MyChart".  Sign up information is provided on this After Visit Summary.  MyChart is used to connect with patients for Virtual Visits (Telemedicine).  Patients are able to view lab/test results, encounter notes, upcoming appointments, etc.  Non-urgent messages can be sent to your provider as well.   To learn more about what you can do with MyChart, go to [email or web address].    Your next appointment:   3 month(s)  Provider:   Jodelle Red, MD

## 2024-01-05 ENCOUNTER — Other Ambulatory Visit: Payer: Self-pay | Admitting: Internal Medicine

## 2024-01-05 ENCOUNTER — Other Ambulatory Visit (INDEPENDENT_AMBULATORY_CARE_PROVIDER_SITE_OTHER): Payer: Medicare PPO

## 2024-01-05 DIAGNOSIS — I482 Chronic atrial fibrillation, unspecified: Secondary | ICD-10-CM

## 2024-01-05 DIAGNOSIS — I10 Essential (primary) hypertension: Secondary | ICD-10-CM | POA: Diagnosis not present

## 2024-01-05 LAB — TSH: TSH: 1.34 u[IU]/mL (ref 0.35–5.50)

## 2024-01-05 LAB — COMPREHENSIVE METABOLIC PANEL WITH GFR
ALT: 12 U/L (ref 0–53)
AST: 15 U/L (ref 0–37)
Albumin: 3.8 g/dL (ref 3.5–5.2)
Alkaline Phosphatase: 64 U/L (ref 39–117)
BUN: 15 mg/dL (ref 6–23)
CO2: 27 meq/L (ref 19–32)
Calcium: 8.8 mg/dL (ref 8.4–10.5)
Chloride: 99 meq/L (ref 96–112)
Creatinine, Ser: 1.22 mg/dL (ref 0.40–1.50)
GFR: 56.71 mL/min — ABNORMAL LOW
Glucose, Bld: 96 mg/dL (ref 70–99)
Potassium: 3.2 meq/L — ABNORMAL LOW (ref 3.5–5.1)
Sodium: 136 meq/L (ref 135–145)
Total Bilirubin: 1.6 mg/dL — ABNORMAL HIGH (ref 0.2–1.2)
Total Protein: 6.9 g/dL (ref 6.0–8.3)

## 2024-01-05 LAB — URINALYSIS, ROUTINE W REFLEX MICROSCOPIC
Bilirubin Urine: NEGATIVE
Ketones, ur: NEGATIVE
Nitrite: NEGATIVE
Specific Gravity, Urine: 1.02 (ref 1.000–1.030)
Total Protein, Urine: 30 — AB
Urine Glucose: NEGATIVE
Urobilinogen, UA: 1 (ref 0.0–1.0)
pH: 6 (ref 5.0–8.0)

## 2024-01-07 ENCOUNTER — Other Ambulatory Visit: Payer: Self-pay | Admitting: Internal Medicine

## 2024-01-07 ENCOUNTER — Encounter: Payer: Self-pay | Admitting: Internal Medicine

## 2024-01-07 ENCOUNTER — Ambulatory Visit: Payer: Medicare PPO | Admitting: Internal Medicine

## 2024-01-07 VITALS — BP 126/70 | HR 53 | Temp 98.3°F | Ht 68.0 in | Wt 170.0 lb

## 2024-01-07 DIAGNOSIS — N390 Urinary tract infection, site not specified: Secondary | ICD-10-CM

## 2024-01-07 DIAGNOSIS — L820 Inflamed seborrheic keratosis: Secondary | ICD-10-CM

## 2024-01-07 DIAGNOSIS — I10 Essential (primary) hypertension: Secondary | ICD-10-CM

## 2024-01-07 DIAGNOSIS — D485 Neoplasm of uncertain behavior of skin: Secondary | ICD-10-CM

## 2024-01-07 LAB — COMPREHENSIVE METABOLIC PANEL
ALT: 15 U/L (ref 0–53)
AST: 19 U/L (ref 0–37)
Albumin: 3.7 g/dL (ref 3.5–5.2)
Alkaline Phosphatase: 59 U/L (ref 39–117)
BUN: 16 mg/dL (ref 6–23)
CO2: 31 meq/L (ref 19–32)
Calcium: 8.9 mg/dL (ref 8.4–10.5)
Chloride: 100 meq/L (ref 96–112)
Creatinine, Ser: 1.08 mg/dL (ref 0.40–1.50)
GFR: 65.63 mL/min (ref >60.00)
Glucose, Bld: 100 mg/dL — ABNORMAL HIGH (ref 70–99)
Potassium: 3.2 meq/L — ABNORMAL LOW (ref 3.5–5.1)
Sodium: 139 meq/L (ref 135–145)
Total Bilirubin: 1 mg/dL (ref 0.2–1.2)
Total Protein: 6.8 g/dL (ref 6.0–8.3)

## 2024-01-07 LAB — CBC WITH DIFFERENTIAL/PLATELET
Basophils Absolute: 0 10*3/uL (ref 0.0–0.1)
Basophils Relative: 0.7 % (ref 0.0–3.0)
Eosinophils Absolute: 0.1 10*3/uL (ref 0.0–0.7)
Eosinophils Relative: 2.9 % (ref 0.0–5.0)
HCT: 39.4 % (ref 39.0–52.0)
Hemoglobin: 13.4 g/dL (ref 13.0–17.0)
Lymphocytes Relative: 15.8 % (ref 12.0–46.0)
Lymphs Abs: 0.7 10*3/uL (ref 0.7–4.0)
MCHC: 33.9 g/dL (ref 30.0–36.0)
MCV: 89.4 fL (ref 78.0–100.0)
Monocytes Absolute: 0.6 10*3/uL (ref 0.1–1.0)
Monocytes Relative: 14.1 % — ABNORMAL HIGH (ref 3.0–12.0)
Neutro Abs: 2.9 10*3/uL (ref 1.4–7.7)
Neutrophils Relative %: 66.5 % (ref 43.0–77.0)
Platelets: 129 10*3/uL — ABNORMAL LOW (ref 150.0–400.0)
RBC: 4.41 Mil/uL (ref 4.22–5.81)
RDW: 16.1 % — ABNORMAL HIGH (ref 11.5–15.5)
WBC: 4.4 10*3/uL (ref 4.0–10.5)

## 2024-01-07 LAB — URINALYSIS, ROUTINE W REFLEX MICROSCOPIC
Bilirubin Urine: NEGATIVE
Ketones, ur: NEGATIVE
Nitrite: NEGATIVE
Specific Gravity, Urine: 1.015 (ref 1.000–1.030)
Total Protein, Urine: 30 — AB
Urine Glucose: NEGATIVE
Urobilinogen, UA: 1 (ref 0.0–1.0)
pH: 7.5 (ref 5.0–8.0)

## 2024-01-07 MED ORDER — CEFUROXIME AXETIL 250 MG PO TABS: 250.0000 mg | ORAL_TABLET | Freq: Two times a day (BID) | ORAL | 1 refills | Status: AC

## 2024-01-07 NOTE — Progress Notes (Signed)
 Subjective:  Patient ID: Andrew Joseph., male    DOB: [birth year]  Age: 78 y.o. MRN: [id number]  CC: Medical Management of Chronic Issues (2 mnth f/u)   HPI Andrew Joseph. presents for CAD, HTN, A fib F/u on abn UA  C/o painful skin lesion on the neck  Outpatient Medications Prior to Visit  Medication Sig Dispense Refill   amiodarone  (PACERONE ) 200 MG tablet Take 200 mg by mouth daily.     ELIQUIS  5 MG TABS tablet TAKE 1 TABLET BY MOUTH TWICE DAILY 180 tablet 2   irbesartan -hydrochlorothiazide  (AVALIDE) 150-12.5 MG tablet TAKE 1 TABLET BY MOUTH DAILY 90 tablet 3   labetalol  (NORMODYNE ) 300 MG tablet Take 1 tablet (300 mg total) by mouth 2 (two) times daily. 180 tablet 3   rosuvastatin  (CRESTOR ) 20 MG tablet TAKE 1 TABLET BY MOUTH DAILY 90 tablet 1   zolpidem  (AMBIEN ) 10 MG tablet TAKE 1 TABLET BY MOUTH AT BEDTIME 90 tablet 1   benzonatate  (TESSALON ) 100 MG capsule Take 1 capsule (100 mg total) by mouth every 8 (eight) hours as needed for cough. (Patient not taking: Reported on 01/07/2024) 21 capsule 0   fluticasone  (FLONASE ) 50 MCG/ACT nasal spray Place 1 spray into both nostrils daily. (Patient not taking: Reported on 01/07/2024) 16 g 0   HYDROcodone  bit-homatropine (HYCODAN) 5-1.5 MG/5ML syrup Take 5 mLs by mouth every 4 (four) hours as needed for cough. (Patient not taking: Reported on 01/07/2024) 240 mL 0   cefdinir  (OMNICEF ) 300 MG capsule Take 1 capsule (300 mg total) by mouth 2 (two) times daily. (Patient not taking: Reported on 01/07/2024) 20 capsule 0   No facility-administered medications prior to visit.    ROS: Review of Systems  Constitutional:  Negative for appetite change, fatigue and unexpected weight change.  HENT:  Negative for congestion, nosebleeds, sneezing, sore throat and trouble swallowing.   Eyes:  Negative for itching and visual disturbance.  Respiratory:  Negative for cough.   Cardiovascular:  Negative for chest pain, palpitations and leg swelling.   Gastrointestinal:  Negative for abdominal distention, blood in stool, diarrhea and nausea.  Genitourinary:  Negative for decreased urine volume, dysuria, frequency, genital sores, hematuria, scrotal swelling and urgency.  Musculoskeletal:  Negative for back pain, gait problem, joint swelling and neck pain.  Skin:  Negative for rash.  Neurological:  Negative for dizziness, tremors, speech difficulty and weakness.  Psychiatric/Behavioral:  Negative for agitation, dysphoric mood and sleep disturbance. The patient is not nervous/anxious.     Objective:  BP 126/70 (BP Location: Left Arm, Patient Position: Sitting, Cuff Size: Normal)   Pulse (!) 53   Temp 98.3 F (36.8 C) (Oral)   Ht 5' 8 (1.727 m)   Wt 170 lb (77.1 kg)   SpO2 96%   BMI 25.85 kg/m   BP Readings from Last 3 Encounters:  01/07/24 126/70  12/31/23 132/64  11/06/23 130/78    Wt Readings from Last 3 Encounters:  01/07/24 170 lb (77.1 kg)  12/31/23 173 lb 12.8 oz (78.8 kg)  11/06/23 166 lb (75.3 kg)    Physical Exam Constitutional:      General: He is not in acute distress.    Appearance: He is well-developed.     Comments: NAD  Eyes:     Conjunctiva/sclera: Conjunctivae normal.     Pupils: Pupils are equal, round, and reactive to light.  Neck:     Thyroid : No thyromegaly.     Vascular: No JVD.  Cardiovascular:     Rate and Rhythm: Normal rate and regular rhythm.     Heart sounds: Normal heart sounds. No murmur heard.    No friction rub. No gallop.  Pulmonary:     Effort: Pulmonary effort is normal. No respiratory distress.     Breath sounds: Normal breath sounds. No wheezing or rales.  Chest:     Chest wall: No tenderness.  Abdominal:     General: Bowel sounds are normal. There is no distension.     Palpations: Abdomen is soft. There is no mass.     Tenderness: There is no abdominal tenderness. There is no guarding or rebound.  Musculoskeletal:        General: No tenderness. Normal range of motion.      Cervical back: Normal range of motion.  Lymphadenopathy:     Cervical: No cervical adenopathy.  Skin:    General: Skin is warm and dry.     Findings: No rash.  Neurological:     Mental Status: He is alert and oriented to person, place, and time.     Cranial Nerves: No cranial nerve deficit.     Motor: No abnormal muscle tone.     Coordination: Coordination normal.     Gait: Gait normal.     Deep Tendon Reflexes: Reflexes are normal and symmetric.  Psychiatric:        Behavior: Behavior normal.        Thought Content: Thought content normal.        Judgment: Judgment normal.   L anterior neck base inflamed lesion 6x4 mm   Procedure Note :     Procedure :  Skin biopsy   Indication:  Changing mole (s ),  Suspicious lesion(s)   Risks including unsuccessful procedure , bleeding, infection, bruising, scar, a need for another complete procedure and others were explained to the patient in detail as well as the benefits. Informed consent was obtained verbally.  The patient was placed in a decubitus position.  Lesion #1 on L anterior neck base inflamed lesion 6x4 mm   Skin over lesion #1  was prepped with Betadine and alcohol  and anesthetized with 1 cc of 2% lidocaine  and epinephrine , using a 25-gauge 1 inch needle.  Shave biopsy with a sterile Dermablade was carried out in the usual fashion. Hyfrecator was used to destroy the rest of the lesion potentially left behind and for hemostasis. Band-Aid was applied with antibiotic ointment.      Postprocedure instructions :    A Band-Aid should be  changed once or twice daily. You can take a shower tomorrow.  Keep the wounds clean. You can wash them with liquid soap and water. Pat dry with gauze or a Kleenex tissue before applying antibiotic ointment and a Band-Aid.   You need to report immediately  if fever, chills or any signs of infection develop.    The biopsy results should be available in 1 -2 weeks.   Lab Results  Component  Value Date   WBC 4.0 07/18/2023   HGB 14.4 07/18/2023   HCT 42.4 07/18/2023   PLT 120 (L) 07/18/2023   GLUCOSE 96 01/05/2024   CHOL 185 12/30/2023   TRIG 58 12/30/2023   HDL 49 12/30/2023   LDLDIRECT 296.1 08/12/2013   LDLCALC 125 (H) 12/30/2023   ALT 12 01/05/2024   AST 15 01/05/2024   NA 136 01/05/2024   K 3.2 (L) 01/05/2024   CL 99 01/05/2024   CREATININE 1.22  01/05/2024   BUN 15 01/05/2024   CO2 27 01/05/2024   TSH 1.34 01/05/2024   PSA 0.00 (L) 03/31/2023   INR 0.99 07/31/2011   HGBA1C 5.8 03/09/2014   MICROALBUR 2.5 (H) 10/02/2023    No results found.  Assessment & Plan:   Problem List Items Addressed This Visit     NEOPLASM, SKIN, UNCERTAIN BEHAVIOR   L anterior neck base inflamed lesion 6x4 mm Will remove      Essential hypertension    On Labetalol  300 mg bid and Avalide      Relevant Orders   Urinalysis   Comprehensive metabolic panel   CBC with Differential/Platelet   UTI (urinary tract infection) - Primary   Rx abx Repeat UA Ordered renal US       Relevant Medications   cefUROXime  (CEFTIN ) 250 MG tablet   Other Relevant Orders   US  RENAL   Urinalysis   Comprehensive metabolic panel   CBC with Differential/Platelet      Meds ordered this encounter  Medications   cefUROXime  (CEFTIN ) 250 MG tablet    Sig: Take 1 tablet (250 mg total) by mouth 2 (two) times daily with a meal for 10 days.    Dispense:  14 tablet    Refill:  1      Follow-up: Return in about 3 months (around 04/05/2024) for a follow-up visit.  Marolyn Noel, MD

## 2024-01-07 NOTE — Assessment & Plan Note (Signed)
 Rx abx Repeat UA Ordered renal US 

## 2024-01-07 NOTE — Assessment & Plan Note (Signed)
 L anterior neck base inflamed lesion 6x4 mm Will remove

## 2024-01-07 NOTE — Addendum Note (Signed)
 Addended by: Hildegard Low R on: 01/07/2024 11:19 AM   Modules accepted: Orders

## 2024-01-07 NOTE — Patient Instructions (Signed)
Postprocedure instructions :    A Band-Aid should be  changed once or twice daily. You can take a shower tomorrow.  Keep the wounds clean. You can wash them with liquid soap and water. Pat dry with gauze or a Kleenex tissue before applying antibiotic ointment and a Band-Aid.   You need to report immediately  if fever, chills or any signs of infection develop.    The biopsy results should be available in 1 -2 weeks.   

## 2024-01-07 NOTE — Assessment & Plan Note (Signed)
On Labetalol 300 mg bid and Avalide

## 2024-01-08 ENCOUNTER — Ambulatory Visit
Admission: RE | Admit: 2024-01-08 | Discharge: 2024-01-08 | Disposition: A | Discharge status: Home / Self Care | Payer: Medicare PPO | Source: Ambulatory Visit | Attending: Internal Medicine | Admitting: Internal Medicine

## 2024-01-08 DIAGNOSIS — N390 Urinary tract infection, site not specified: Secondary | ICD-10-CM | POA: Diagnosis not present

## 2024-01-09 ENCOUNTER — Other Ambulatory Visit: Payer: Self-pay | Admitting: Internal Medicine

## 2024-01-09 DIAGNOSIS — E876 Hypokalemia: Secondary | ICD-10-CM

## 2024-01-09 MED ORDER — POTASSIUM CHLORIDE CRYS ER 20 MEQ PO TBCR: 20.0000 meq | EXTENDED_RELEASE_TABLET | Freq: Two times a day (BID) | ORAL | 0 refills | Status: DC

## 2024-01-09 NOTE — Assessment & Plan Note (Signed)
 Re-start KCl 20 bid x 1 mo

## 2024-01-11 ENCOUNTER — Encounter (HOSPITAL_BASED_OUTPATIENT_CLINIC_OR_DEPARTMENT_OTHER): Payer: Self-pay | Admitting: Cardiology

## 2024-01-13 LAB — DERMATOLOGY PATHOLOGY

## 2024-01-14 ENCOUNTER — Other Ambulatory Visit: Payer: Self-pay | Admitting: Internal Medicine

## 2024-01-14 DIAGNOSIS — N390 Urinary tract infection, site not specified: Secondary | ICD-10-CM

## 2024-01-14 DIAGNOSIS — E876 Hypokalemia: Secondary | ICD-10-CM

## 2024-02-02 ENCOUNTER — Ambulatory Visit: Payer: Medicare PPO | Admitting: Internal Medicine

## 2024-02-05 ENCOUNTER — Other Ambulatory Visit (INDEPENDENT_AMBULATORY_CARE_PROVIDER_SITE_OTHER): Payer: Medicare PPO

## 2024-02-05 DIAGNOSIS — E876 Hypokalemia: Secondary | ICD-10-CM | POA: Diagnosis not present

## 2024-02-05 DIAGNOSIS — N390 Urinary tract infection, site not specified: Secondary | ICD-10-CM | POA: Diagnosis not present

## 2024-02-05 LAB — COMPREHENSIVE METABOLIC PANEL
ALT: 20 U/L (ref 0–53)
AST: 24 U/L (ref 0–37)
Albumin: 4.1 g/dL (ref 3.5–5.2)
Alkaline Phosphatase: 70 U/L (ref 39–117)
BUN: 14 mg/dL (ref 6–23)
CO2: 28 meq/L (ref 19–32)
Calcium: 9.5 mg/dL (ref 8.4–10.5)
Chloride: 99 meq/L (ref 96–112)
Creatinine, Ser: 1.33 mg/dL (ref 0.40–1.50)
GFR: 51.09 mL/min — ABNORMAL LOW (ref >60.00)
Glucose, Bld: 98 mg/dL (ref 70–99)
Potassium: 4 meq/L (ref 3.5–5.1)
Sodium: 135 meq/L (ref 135–145)
Total Bilirubin: 1 mg/dL (ref 0.2–1.2)
Total Protein: 7 g/dL (ref 6.0–8.3)

## 2024-02-05 LAB — URINALYSIS
Bilirubin Urine: NEGATIVE
Hgb urine dipstick: NEGATIVE
Ketones, ur: NEGATIVE
Leukocytes,Ua: NEGATIVE
Nitrite: NEGATIVE
Specific Gravity, Urine: 1.01 (ref 1.000–1.030)
Total Protein, Urine: NEGATIVE
Urine Glucose: NEGATIVE
Urobilinogen, UA: 0.2 (ref 0.0–1.0)
pH: 7 (ref 5.0–8.0)

## 2024-02-06 ENCOUNTER — Encounter: Payer: Self-pay | Admitting: Internal Medicine

## 2024-02-06 ENCOUNTER — Ambulatory Visit: Payer: Medicare PPO | Admitting: Internal Medicine

## 2024-02-06 VITALS — BP 110/62 | HR 55 | Temp 98.3°F | Ht 68.0 in | Wt 161.0 lb

## 2024-02-06 DIAGNOSIS — R634 Abnormal weight loss: Secondary | ICD-10-CM | POA: Diagnosis not present

## 2024-02-06 DIAGNOSIS — N189 Chronic kidney disease, unspecified: Secondary | ICD-10-CM | POA: Insufficient documentation

## 2024-02-06 DIAGNOSIS — I4892 Unspecified atrial flutter: Secondary | ICD-10-CM | POA: Diagnosis not present

## 2024-02-06 DIAGNOSIS — N1831 Chronic kidney disease, stage 3a: Secondary | ICD-10-CM

## 2024-02-06 DIAGNOSIS — N390 Urinary tract infection, site not specified: Secondary | ICD-10-CM | POA: Diagnosis not present

## 2024-02-06 DIAGNOSIS — I482 Chronic atrial fibrillation, unspecified: Secondary | ICD-10-CM

## 2024-02-06 DIAGNOSIS — I10 Essential (primary) hypertension: Secondary | ICD-10-CM

## 2024-02-06 MED ORDER — POTASSIUM CHLORIDE ER 8 MEQ PO TBCR: 8.0000 meq | EXTENDED_RELEASE_TABLET | Freq: Every day | ORAL | 11 refills | Status: AC

## 2024-02-06 NOTE — Assessment & Plan Note (Addendum)
 In NSR on Amiodarone On Eliquis

## 2024-02-06 NOTE — Assessment & Plan Note (Signed)
In NSR now 

## 2024-02-06 NOTE — Assessment & Plan Note (Signed)
 GFR 51 Hydrate better

## 2024-02-06 NOTE — Assessment & Plan Note (Signed)
UA is clear

## 2024-02-06 NOTE — Progress Notes (Signed)
 Subjective:  Patient ID: Andrew Buffalo., male    DOB: December 30, 1945  Age: 78 y.o. MRN: [id number]  CC: Medical Management of Chronic Issues (6 MNTH F/U)   HPI Andrew Halt. presents for recent dental work - on Amoxicillin. Can  F/u UTI, HTN, A fib C/o wt loss  Outpatient Medications Prior to Visit  Medication Sig Dispense Refill   amiodarone (PACERONE) 200 MG tablet Take 200 mg by mouth daily.     amoxicillin-clavulanate (AUGMENTIN) 875-125 MG tablet Take 1 tablet by mouth 2 (two) times daily.     ELIQUIS 5 MG TABS tablet TAKE 1 TABLET BY MOUTH TWICE DAILY 180 tablet 2   irbesartan-hydrochlorothiazide (AVALIDE) 150-12.5 MG tablet TAKE 1 TABLET BY MOUTH DAILY 90 tablet 3   labetalol (NORMODYNE) 300 MG tablet Take 1 tablet (300 mg total) by mouth 2 (two) times daily. 180 tablet 3   rosuvastatin (CRESTOR) 20 MG tablet TAKE 1 TABLET BY MOUTH DAILY 90 tablet 1   traMADol (ULTRAM) 50 MG tablet Take 50 mg by mouth every 8 (eight) hours as needed.     zolpidem (AMBIEN) 10 MG tablet TAKE 1 TABLET BY MOUTH AT BEDTIME 90 tablet 1   potassium chloride SA (KLOR-CON M) 20 MEQ tablet Take 1 tablet (20 mEq total) by mouth 2 (two) times daily. 60 tablet 0   benzonatate (TESSALON) 100 MG capsule Take 1 capsule (100 mg total) by mouth every 8 (eight) hours as needed for cough. (Patient not taking: Reported on 12/31/2023) 21 capsule 0   fluticasone (FLONASE) 50 MCG/ACT nasal spray Place 1 spray into both nostrils daily. (Patient not taking: Reported on 12/31/2023) 16 g 0   HYDROcodone bit-homatropine (HYCODAN) 5-1.5 MG/5ML syrup Take 5 mLs by mouth every 4 (four) hours as needed for cough. (Patient not taking: Reported on 12/31/2023) 240 mL 0   No facility-administered medications prior to visit.    ROS: Review of Systems  Constitutional:  Positive for unexpected weight change. Negative for appetite change and fatigue.  HENT:  Positive for dental problem. Negative for congestion, nosebleeds,  sneezing, sore throat and trouble swallowing.   Eyes:  Negative for itching and visual disturbance.  Respiratory:  Negative for cough.   Cardiovascular:  Negative for chest pain, palpitations and leg swelling.  Gastrointestinal:  Negative for abdominal distention, blood in stool, diarrhea and nausea.  Genitourinary:  Negative for frequency and hematuria.  Musculoskeletal:  Negative for back pain, gait problem, joint swelling and neck pain.  Skin:  Negative for rash.  Neurological:  Negative for dizziness, tremors, speech difficulty and weakness.  Psychiatric/Behavioral:  Negative for agitation, dysphoric mood, sleep disturbance and suicidal ideas. The patient is not nervous/anxious.     Objective:  BP 110/62   Pulse (!) 55   Temp 98.3 F (36.8 C) (Oral)   Ht 5\' 8"  (1.727 m)   Wt 161 lb (73 kg)   SpO2 95%   BMI 24.48 kg/m   BP Readings from Last 3 Encounters:  02/06/24 110/62  01/07/24 126/70  12/31/23 132/64    Wt Readings from Last 3 Encounters:  02/06/24 161 lb (73 kg)  01/07/24 170 lb (77.1 kg)  12/31/23 173 lb 12.8 oz (78.8 kg)    Physical Exam Constitutional:      General: He is not in acute distress.    Appearance: Normal appearance. He is well-developed.     Comments: NAD  Eyes:     Conjunctiva/sclera: Conjunctivae normal.  Pupils: Pupils are equal, round, and reactive to light.  Neck:     Thyroid: No thyromegaly.     Vascular: No JVD.  Cardiovascular:     Rate and Rhythm: Normal rate and regular rhythm.     Heart sounds: Normal heart sounds. No murmur heard.    No friction rub. No gallop.  Pulmonary:     Effort: Pulmonary effort is normal. No respiratory distress.     Breath sounds: Normal breath sounds. No wheezing or rales.  Chest:     Chest wall: No tenderness.  Abdominal:     General: Bowel sounds are normal. There is no distension.     Palpations: Abdomen is soft. There is no mass.     Tenderness: There is no abdominal tenderness. There is no  guarding or rebound.  Musculoskeletal:        General: No tenderness. Normal range of motion.     Cervical back: Normal range of motion and neck supple.     Right lower leg: No edema.     Left lower leg: No edema.  Lymphadenopathy:     Cervical: No cervical adenopathy.  Skin:    General: Skin is warm and dry.     Findings: No rash.  Neurological:     Mental Status: He is alert and oriented to person, place, and time.     Cranial Nerves: No cranial nerve deficit.     Motor: No abnormal muscle tone.     Coordination: Coordination normal.     Gait: Gait normal.     Deep Tendon Reflexes: Reflexes are normal and symmetric.  Psychiatric:        Behavior: Behavior normal.        Thought Content: Thought content normal.        Judgment: Judgment normal.     Lab Results  Component Value Date   WBC 4.4 01/07/2024   HGB 13.4 01/07/2024   HCT 39.4 01/07/2024   PLT 129.0 (L) 01/07/2024   GLUCOSE 98 02/05/2024   CHOL 185 12/30/2023   TRIG 58 12/30/2023   HDL 49 12/30/2023   LDLDIRECT 296.1 08/12/2013   LDLCALC 125 (H) 12/30/2023   ALT 20 02/05/2024   AST 24 02/05/2024   NA 135 02/05/2024   K 4.0 02/05/2024   CL 99 02/05/2024   CREATININE 1.33 02/05/2024   BUN 14 02/05/2024   CO2 28 02/05/2024   TSH 1.34 01/05/2024   PSA 0.00 (L) 03/31/2023   INR 0.99 07/31/2011   HGBA1C 5.8 03/09/2014   MICROALBUR 2.5 (H) 10/02/2023    US RENAL Result Date: 01/08/2024 : PROCEDURE: US RENAL HISTORY: Patient is a 78 y/o  M with UTI. COMPARISON: None available TECHNIQUE: Two-dimensional grayscale and color Doppler ultrasound of the kidneys was performed. FINDINGS: The urinary bladder demonstrates normal anechoic echogenicity. The bilateral ureteral jets are not visualized. Bladder volume is 159 mL. The right kidney measures 10.2 x 5.4 x 5.1 cm. Renal cortical echotexture is mildly increased. There is no hydronephrosis. There are no stones. There are no cysts. The left kidney measures 11.1 x 5.8 x  4.7 cm. Renal cortical echotexture is mildly increased. There is no hydronephrosis. There are no stones. There is a septated cyst measuring 1.4 cm at the lateral superior pole. IMPRESSION: 1. Mildly increased echogenicity of the bilateral renal cortices with left renal cyst, consistent with medical renal disease. Thank you for allowing [place] to assist in the care of this patient. Electronically Signed  By: Lestine Box M.D.   On: 01/08/2024 23:21    Assessment & Plan:   Problem List Items Addressed This Visit     Essential hypertension    On Labetalol 300 mg bid and Avalide      Atrial flutter (HCC)   In NSR now      Atrial fibrillation (HCC)   In NSR on Amiodarone On Eliquis      Relevant Orders   Comprehensive metabolic panel   Urinalysis   Weight loss - Primary   Due to recent teeth extraction Pureed soups Loaded potato soup Cheddar broccoli Tomato bisque Milk shake      UTI (urinary tract infection)   UA is clear      CRI (chronic renal insufficiency)   GFR 51 Hydrate better      Relevant Orders   Comprehensive metabolic panel   Urinalysis      Meds ordered this encounter  Medications   potassium chloride (KLOR-CON) 8 MEQ tablet    Sig: Take 1 tablet (8 mEq total) by mouth daily.    Dispense:  30 tablet    Refill:  11      Follow-up: Return in about 3 months (around 05/08/2024) for a follow-up visit.  Sonda Primes, MD

## 2024-02-06 NOTE — Assessment & Plan Note (Addendum)
 Due to recent teeth extraction Pureed soups Loaded potato soup Cheddar broccoli Tomato bisque Milk shake

## 2024-02-06 NOTE — Patient Instructions (Addendum)
 Loaded potato soup Cheddar broccoli Tomato bisque Milk shake

## 2024-02-06 NOTE — Assessment & Plan Note (Signed)
On Labetalol 300 mg bid and Avalide

## 2024-02-10 ENCOUNTER — Telehealth: Payer: Self-pay | Admitting: Internal Medicine

## 2024-02-10 NOTE — Telephone Encounter (Signed)
 Pt is requesting a call back about his labs & UA from 3.6.25.   Please review and advise pt: [phone]

## 2024-02-18 NOTE — Telephone Encounter (Signed)
 Urinalysis was normal.  Renal function test GFR was slightly decreased.  Hydrate yourself well.  Will repeat tests before his next appointment.  Thank you

## 2024-02-19 NOTE — Telephone Encounter (Signed)
 Spoke with the pt and was able to inform him of providers response. Pt stated understanding and has no questions or concerns at this time.

## 2024-02-23 ENCOUNTER — Other Ambulatory Visit: Payer: Self-pay | Admitting: Cardiovascular Disease

## 2024-02-23 DIAGNOSIS — I4892 Unspecified atrial flutter: Secondary | ICD-10-CM

## 2024-02-23 DIAGNOSIS — I4819 Other persistent atrial fibrillation: Secondary | ICD-10-CM

## 2024-03-13 ENCOUNTER — Other Ambulatory Visit: Payer: Self-pay | Admitting: Internal Medicine

## 2024-03-22 ENCOUNTER — Other Ambulatory Visit: Payer: Self-pay | Admitting: Internal Medicine

## 2024-04-06 ENCOUNTER — Ambulatory Visit: Payer: Medicare PPO | Admitting: Internal Medicine

## 2024-04-12 ENCOUNTER — Encounter (HOSPITAL_BASED_OUTPATIENT_CLINIC_OR_DEPARTMENT_OTHER): Payer: Self-pay | Admitting: Cardiology

## 2024-04-12 ENCOUNTER — Ambulatory Visit (HOSPITAL_BASED_OUTPATIENT_CLINIC_OR_DEPARTMENT_OTHER): Payer: Medicare PPO | Admitting: Cardiology

## 2024-04-12 VITALS — BP 138/64 | HR 51 | Ht 68.0 in | Wt 168.4 lb

## 2024-04-12 DIAGNOSIS — D6869 Other thrombophilia: Secondary | ICD-10-CM

## 2024-04-12 DIAGNOSIS — E785 Hyperlipidemia, unspecified: Secondary | ICD-10-CM | POA: Diagnosis not present

## 2024-04-12 DIAGNOSIS — Z79899 Other long term (current) drug therapy: Secondary | ICD-10-CM

## 2024-04-12 DIAGNOSIS — Z7901 Long term (current) use of anticoagulants: Secondary | ICD-10-CM | POA: Diagnosis not present

## 2024-04-12 DIAGNOSIS — I10 Essential (primary) hypertension: Secondary | ICD-10-CM

## 2024-04-12 DIAGNOSIS — I2510 Atherosclerotic heart disease of native coronary artery without angina pectoris: Secondary | ICD-10-CM

## 2024-04-12 DIAGNOSIS — I480 Paroxysmal atrial fibrillation: Secondary | ICD-10-CM

## 2024-04-12 DIAGNOSIS — Z955 Presence of coronary angioplasty implant and graft: Secondary | ICD-10-CM

## 2024-04-12 MED ORDER — AMIODARONE HCL 200 MG PO TABS: 200.0000 mg | ORAL_TABLET | Freq: Every day | ORAL | 3 refills | Status: DC

## 2024-04-12 NOTE — Patient Instructions (Signed)
 Medication Instructions:  Your physician recommends that you continue on your current medications as directed. Please refer to the Current Medication list given to you today.   Lab Work: Your physician recommends that you return for lab work in 4 months: LFT and TSH  Follow-Up: Please follow up in 4 months with Dr. Veryl Gottron, Slater Duncan, NP or Neomi Banks, NP

## 2024-04-12 NOTE — Progress Notes (Signed)
 Cardiology Office Note:  .    Date:  04/12/2024  ID:  Andrew Donalds., DOB 10/10/1946, MRN [id number] PCP: Genia Kettering, MD  [facility] Providers Cardiologist:  Sheryle Donning, MD Electrophysiologist:  Efraim Grange, MD     History of Present Illness: .    Andrew Kneifl. is a 78 y.o. male with a hx of CAD with remote stent, paroxysmal atrial fibrillation/flutter who is seen for follow up. I initially met him 09/07/2019 as a new consult at the request of [name], Oakley Bellman, MD for the evaluation and management of CAD.   CV history: distal LAD stent, last cath 05-03-2011 Father died of MI age 89. Hypercholesterolemia: prior LDL 124. Was told in the past his total cholesterol was over 400.    Cardiovascular risk factors: Prior clinical ASCVD: CAD with PCI to LAD. ?CVA in 2006-05-02 (quarter sized bleed in his brain--lightheadedness/dizziness), unclear ?seizure event 05/03/11 Comorbid conditions: hypertension, hyperlipidemia Prior cardiac testing: cath 2010, 2012. Echo 05-02-2014. MPI 1997, 200006/01/2002June 01, 2003   Arrhythmia history: He established care with EP Dr. Arlester Ladd 03/14/23. He was scheduled for ablation in September. In the interim he was started on amiodarone  underwent cardioversion. He cancelled ablation that was scheduled for 08/07/23.  Today: Overall doing well. Pending major dental work, will hold apixaban  for several days prior, which is fine. Has not had bleeding issues other than dental extractions.   Initial BP elevated today, was stressful to get here. Improved on recheck.   Hasn't noticed any symptoms of afib recently. Discussed signs/symptoms of both afib and MI, when to seen emergency medical care. Asking about asymptomatic screening, discussed at length. Able to walk several miles at a time if needed.   ROS:  Denies chest pain, shortness of breath at rest or with normal exertion. No PND, orthopnea, LE edema or unexpected weight gain. No syncope or  palpitations. ROS otherwise negative except as noted.   Studies Reviewed: Aaron Aas       No new today  Physical Exam:    VS:  BP 138/64   Pulse (!) 51   Ht 5\' 8"  (1.727 m)   Wt 168 lb 6.4 oz (76.4 kg)   SpO2 99%   BMI 25.61 kg/m    Wt Readings from Last 3 Encounters:  04/12/24 168 lb 6.4 oz (76.4 kg)  02/06/24 161 lb (73 kg)  01/07/24 170 lb (77.1 kg)    GEN: Well nourished, well developed in no acute distress HEENT: Normal, moist mucous membranes NECK: No JVD CARDIAC: regular rhythm, normal S1 and S2, no rubs or gallops. No murmur. VASCULAR: Radial and DP pulses 2+ bilaterally. No carotid bruits RESPIRATORY:  Clear to auscultation without rales, wheezing or rhonchi  ABDOMEN: Soft, non-tender, non-distended MUSCULOSKELETAL:  Ambulates independently SKIN: Warm and dry, no edema NEUROLOGIC:  Alert and oriented x 3. No focal neuro deficits noted. PSYCHIATRIC:  Normal affect    ASSESSMENT AND PLAN: .    Atrial flutter/atrial fibrillation, now paroxysmal -CHA2DS2/VAS Stroke Risk Points= 4  -no bleeding issues, continue apixaban . Ok to hold briefly for procedures. -tolerating amiodarone , started 03/2023. We have discussed ablation and other antiarrhythmics, after shared decision making will continue amiodarone . Thyroid , LFTs stable in last 6 mos, will order today to have drawn prior to next follow up -lasix /potassium only PRN as he has swelling when in afib   CAD with history of prior stent Hypercholesterolemia -asymptomatic.  -stopped aspirin  with start of apixaban  -continue rosuvastatin  20 mg  daily -LDL goal <70, last 125. We have discussed options for changing therapy/management. Per patient preference: declines PCSK9i/additional therapy -asking about asymptomatic testing, discussed guidelines at length -reviewed red flag warning signs that need immediate medical attention   Hypertension: Overall goal <130/80 -typically elevated on initial reading here but improves on  recheck -continue irbesartan -HCTZ 150-12.5 mg daily -trialed carvedilol , BP not well controlled, returned to labetalol  -continue labetalol  300 mg BID (more commonly takes 150 mg BID depending on his blood pressure) -has been on amlodipine  in the past if additional agent needed in the future. Alternative would be to increase irbesartan -HCTZ dose to maximum dosing.    Cardiac risk counseling and prevention recommendations: -recommend heart healthy/Mediterranean diet, with whole grains, fruits, vegetable, fish, lean meats, nuts, and olive oil. Limit salt. -recommend moderate walking, 3-5 times/week for 30-50 minutes each session. Aim for at least 150 minutes.week. Goal should be pace of 3 miles/hours, or walking 1.5 miles in 30 minutes -recommend avoidance of tobacco products. Avoid excess alcohol.  Dispo: Follow-up in 4 months per patient preference, or sooner as needed.  Total time of encounter: I spent 42 minutes dedicated to the care of this patient on the date of this encounter to include pre-visit review of records, face-to-face time with the patient discussing conditions above, and clinical documentation with the electronic health record. We specifically spent time today discussing amiodarone  therapy, blood pressure, goals/management of cholesterol, review of labs, discussion of signs/symptoms that need medical attention.   Signed, Sheryle Donning, MD  Sheryle Donning, MD, PhD, [facility]  [facility]  [place]  [facility] at [facility] at [facility] [address], [address] [place], [place] [address] [phone]

## 2024-04-17 ENCOUNTER — Other Ambulatory Visit: Payer: Self-pay | Admitting: Internal Medicine

## 2024-05-03 ENCOUNTER — Other Ambulatory Visit (HOSPITAL_BASED_OUTPATIENT_CLINIC_OR_DEPARTMENT_OTHER): Payer: Self-pay | Admitting: Cardiology

## 2024-05-03 ENCOUNTER — Other Ambulatory Visit (INDEPENDENT_AMBULATORY_CARE_PROVIDER_SITE_OTHER)

## 2024-05-03 DIAGNOSIS — I484 Atypical atrial flutter: Secondary | ICD-10-CM

## 2024-05-03 DIAGNOSIS — N1831 Chronic kidney disease, stage 3a: Secondary | ICD-10-CM | POA: Diagnosis not present

## 2024-05-03 DIAGNOSIS — I10 Essential (primary) hypertension: Secondary | ICD-10-CM

## 2024-05-03 DIAGNOSIS — I482 Chronic atrial fibrillation, unspecified: Secondary | ICD-10-CM

## 2024-05-03 DIAGNOSIS — I25118 Atherosclerotic heart disease of native coronary artery with other forms of angina pectoris: Secondary | ICD-10-CM

## 2024-05-03 LAB — COMPREHENSIVE METABOLIC PANEL WITH GFR
ALT: 41 U/L (ref 0–53)
AST: 38 U/L — ABNORMAL HIGH (ref 0–37)
Albumin: 4.3 g/dL (ref 3.5–5.2)
Alkaline Phosphatase: 82 U/L (ref 39–117)
BUN: 14 mg/dL (ref 6–23)
CO2: 29 meq/L (ref 19–32)
Calcium: 9.4 mg/dL (ref 8.4–10.5)
Chloride: 103 meq/L (ref 96–112)
Creatinine, Ser: 1.15 mg/dL (ref 0.40–1.50)
GFR: 60.73 mL/min (ref >60.00)
Glucose, Bld: 95 mg/dL (ref 70–99)
Potassium: 3.6 meq/L (ref 3.5–5.1)
Sodium: 138 meq/L (ref 135–145)
Total Bilirubin: 1.1 mg/dL (ref 0.2–1.2)
Total Protein: 6.9 g/dL (ref 6.0–8.3)

## 2024-05-03 LAB — URINALYSIS
Bilirubin Urine: NEGATIVE
Hgb urine dipstick: NEGATIVE
Ketones, ur: NEGATIVE
Leukocytes,Ua: NEGATIVE
Nitrite: NEGATIVE
Specific Gravity, Urine: 1.01 (ref 1.000–1.030)
Total Protein, Urine: NEGATIVE
Urine Glucose: NEGATIVE
Urobilinogen, UA: 0.2 (ref 0.0–1.0)
pH: 7 (ref 5.0–8.0)

## 2024-05-05 ENCOUNTER — Ambulatory Visit: Payer: Self-pay | Admitting: Internal Medicine

## 2024-05-10 ENCOUNTER — Encounter: Payer: Self-pay | Admitting: Internal Medicine

## 2024-05-10 ENCOUNTER — Ambulatory Visit (INDEPENDENT_AMBULATORY_CARE_PROVIDER_SITE_OTHER): Admitting: Internal Medicine

## 2024-05-10 VITALS — BP 118/64 | HR 55 | Temp 98.6°F | Ht 68.0 in | Wt 162.0 lb

## 2024-05-10 DIAGNOSIS — L570 Actinic keratosis: Secondary | ICD-10-CM | POA: Diagnosis not present

## 2024-05-10 DIAGNOSIS — K635 Polyp of colon: Secondary | ICD-10-CM | POA: Diagnosis not present

## 2024-05-10 DIAGNOSIS — J40 Bronchitis, not specified as acute or chronic: Secondary | ICD-10-CM

## 2024-05-10 DIAGNOSIS — E785 Hyperlipidemia, unspecified: Secondary | ICD-10-CM | POA: Diagnosis not present

## 2024-05-10 DIAGNOSIS — Z8546 Personal history of malignant neoplasm of prostate: Secondary | ICD-10-CM | POA: Diagnosis not present

## 2024-05-10 DIAGNOSIS — I2510 Atherosclerotic heart disease of native coronary artery without angina pectoris: Secondary | ICD-10-CM

## 2024-05-10 DIAGNOSIS — I10 Essential (primary) hypertension: Secondary | ICD-10-CM

## 2024-05-10 DIAGNOSIS — G4700 Insomnia, unspecified: Secondary | ICD-10-CM | POA: Diagnosis not present

## 2024-05-10 MED ORDER — TRIAZOLAM 0.25 MG PO TABS: 0.2500 mg | ORAL_TABLET | Freq: Every evening | ORAL | 3 refills | Status: DC | PRN

## 2024-05-10 NOTE — Assessment & Plan Note (Signed)
 Past due colon or sig. Last colon - 2016, 2023 sigmoidoscopy w/Dr Janet Medico Will sch w/GI

## 2024-05-10 NOTE — Assessment & Plan Note (Signed)
On Labetalol 300 mg bid and Avalide

## 2024-05-10 NOTE — Progress Notes (Signed)
 Subjective:  Patient ID: Andrew Donalds., male    DOB: March 06, 1946  Age: 77 y.o. MRN: [id number]  CC: Medical Management of Chronic Issues (3 mnth f/u)   HPI Andrew Donalds. presents for wt loss (dental implants x6) F/u dyslipidemia, CAD, HTN C/o cough returned  Outpatient Medications Prior to Visit  Medication Sig Dispense Refill   amiodarone  (PACERONE ) 200 MG tablet Take 1 tablet (200 mg total) by mouth daily. 90 tablet 3   ELIQUIS  5 MG TABS tablet TAKE 1 TABLET BY MOUTH TWICE A DAY 180 tablet 2   fluticasone  (FLONASE ) 50 MCG/ACT nasal spray Place 1 spray into both nostrils daily. 16 g 0   irbesartan -hydrochlorothiazide  (AVALIDE) 150-12.5 MG tablet TAKE 1 TABLET BY MOUTH DAILY 90 tablet 3   labetalol  (NORMODYNE ) 300 MG tablet TAKE 1 TABLET BY MOUTH 3 TIMES DAILY 270 tablet 3   potassium chloride  (KLOR-CON ) 8 MEQ tablet Take 1 tablet (8 mEq total) by mouth daily. 30 tablet 11   rosuvastatin  (CRESTOR ) 20 MG tablet TAKE 1 TABLET BY MOUTH DAILY 90 tablet 1   No facility-administered medications prior to visit.    ROS: Review of Systems  Constitutional:  Positive for unexpected weight change. Negative for appetite change and fatigue.  HENT:  Negative for congestion, nosebleeds, sneezing, sore throat and trouble swallowing.   Eyes:  Negative for itching and visual disturbance.  Respiratory:  Positive for cough.   Cardiovascular:  Negative for chest pain, palpitations and leg swelling.  Gastrointestinal:  Negative for abdominal distention, blood in stool, diarrhea and nausea.  Genitourinary:  Negative for frequency and hematuria.  Musculoskeletal:  Negative for back pain, gait problem, joint swelling and neck pain.  Skin:  Negative for rash.  Neurological:  Negative for dizziness, tremors, speech difficulty and weakness.  Psychiatric/Behavioral:  Positive for sleep disturbance. Negative for agitation and dysphoric mood. The patient is not nervous/anxious.     Objective:   BP 118/64   Pulse (!) 55   Temp 98.6 F (37 C) (Oral)   Ht 5\' 8"  (1.727 m)   Wt 162 lb (73.5 kg)   SpO2 94%   BMI 24.63 kg/m   BP Readings from Last 3 Encounters:  05/10/24 118/64  04/12/24 138/64  02/06/24 110/62    Wt Readings from Last 3 Encounters:  05/10/24 162 lb (73.5 kg)  04/12/24 168 lb 6.4 oz (76.4 kg)  02/06/24 161 lb (73 kg)    Physical Exam Constitutional:      General: He is not in acute distress.    Appearance: He is well-developed.     Comments: NAD  Eyes:     Conjunctiva/sclera: Conjunctivae normal.     Pupils: Pupils are equal, round, and reactive to light.  Neck:     Thyroid : No thyromegaly.     Vascular: No JVD.  Cardiovascular:     Rate and Rhythm: Normal rate and regular rhythm.     Heart sounds: Normal heart sounds. No murmur heard.    No friction rub. No gallop.  Pulmonary:     Effort: Pulmonary effort is normal. No respiratory distress.     Breath sounds: Normal breath sounds. No wheezing or rales.  Chest:     Chest wall: No tenderness.  Abdominal:     General: Bowel sounds are normal. There is no distension.     Palpations: Abdomen is soft. There is no mass.     Tenderness: There is no abdominal tenderness. There is no guarding  or rebound.  Musculoskeletal:        General: No tenderness. Normal range of motion.     Cervical back: Normal range of motion.  Lymphadenopathy:     Cervical: No cervical adenopathy.  Skin:    General: Skin is warm and dry.     Findings: No rash.  Neurological:     Mental Status: He is alert and oriented to person, place, and time.     Cranial Nerves: No cranial nerve deficit.     Motor: No abnormal muscle tone.     Coordination: Coordination normal.     Gait: Gait normal.     Deep Tendon Reflexes: Reflexes are normal and symmetric.  Psychiatric:        Behavior: Behavior normal.        Thought Content: Thought content normal.        Judgment: Judgment normal.   Actinic keratosis(es) x1 on  scalp    Procedure Note :     Procedure : Cryosurgery   Indication:  Actinic keratosis(es) x1 on scalp   Risks including unsuccessful procedure , bleeding, infection, bruising, scar, a need for a repeat  procedure and others were explained to the patient in detail as well as the benefits. Informed consent was obtained verbally.    1 lesion(s)  on   scalp was/were treated with liquid nitrogen on a Q-tip in a usual fasion . Band-Aid was applied and antibiotic ointment was given for a later use.   Tolerated well. Complications none.   Postprocedure instructions :     Keep the wounds clean. You can wash them with liquid soap and water. Pat dry with gauze or a Kleenex tissue  Before applying antibiotic ointment and a Band-Aid.   You need to report immediately  if  any signs of infection develop.   Lab Results  Component Value Date   WBC 4.4 01/07/2024   HGB 13.4 01/07/2024   HCT 39.4 01/07/2024   PLT 129.0 (L) 01/07/2024   GLUCOSE 95 05/03/2024   CHOL 185 12/30/2023   TRIG 58 12/30/2023   HDL 49 12/30/2023   LDLDIRECT 296.1 08/12/2013   LDLCALC 125 (H) 12/30/2023   ALT 41 05/03/2024   AST 38 (H) 05/03/2024   NA 138 05/03/2024   K 3.6 05/03/2024   CL 103 05/03/2024   CREATININE 1.15 05/03/2024   BUN 14 05/03/2024   CO2 29 05/03/2024   TSH 1.34 01/05/2024   PSA 0.00 (L) 03/31/2023   INR 0.99 07/31/2011   HGBA1C 5.8 03/09/2014   MICROALBUR 2.5 (H) 10/02/2023    US  RENAL Result Date: 01/08/2024 : PROCEDURE: US  RENAL HISTORY: Patient is a 78 y/o  M with UTI. COMPARISON: None available TECHNIQUE: Two-dimensional grayscale and color Doppler ultrasound of the kidneys was performed. FINDINGS: The urinary bladder demonstrates normal anechoic echogenicity. The bilateral ureteral jets are not visualized. Bladder volume is 159 mL. The right kidney measures 10.2 x 5.4 x 5.1 cm. Renal cortical echotexture is mildly increased. There is no hydronephrosis. There are no stones. There are no  cysts. The left kidney measures 11.1 x 5.8 x 4.7 cm. Renal cortical echotexture is mildly increased. There is no hydronephrosis. There are no stones. There is a septated cyst measuring 1.4 cm at the lateral superior pole. IMPRESSION: 1. Mildly increased echogenicity of the bilateral renal cortices with left renal cyst, consistent with medical renal disease. Thank you for allowing us  to assist in the care of this patient. Electronically Signed  By: Beula Brunswick M.D.   On: 01/08/2024 23:21    Assessment & Plan:   Problem List Items Addressed This Visit     Dyslipidemia   Cont on Cresor      Relevant Orders   TSH   Lipid panel   Insomnia, persistent   Chronic Zolpidem  prn - not working any longer Will try Halcion prn  Potential benefits of a long term benzodiazepines  use as well as potential risks  and complications were explained to the patient and were aknowledged.      Essential hypertension    On Labetalol  300 mg bid and Avalide      Relevant Orders   Comprehensive metabolic panel with GFR   Coronary artery disease involving native coronary artery of native heart without angina pectoris - Primary   F/u w/Dr Veryl Gottron Remote STENT Chronic No angina On Crestor , Eliquis       Relevant Orders   CBC with Differential/Platelet   TSH   Lipid panel   PROSTATE CANCER, HX OF   Relevant Orders   PSA   Actinic keratosis   Scalp - see procedure      Colon polyps   Past due colon or sig. Last colon - 2016, 2023 sigmoidoscopy w/Dr Janet Medico Will sch w/GI      Relevant Orders   Ambulatory referral to Gastroenterology   Bronchitis   Better         Meds ordered this encounter  Medications   triazolam (HALCION) 0.25 MG tablet    Sig: Take 1-2 tablets (0.25-0.5 mg total) by mouth at bedtime as needed for sleep.    Dispense:  60 tablet    Refill:  3      Follow-up: Return in about 3 months (around 08/10/2024) for a follow-up visit.  Anitra Barn, MD

## 2024-05-10 NOTE — Patient Instructions (Signed)
   Postprocedure instructions :     Keep the wounds clean. You can wash them with liquid soap and water. Pat dry with gauze or a Kleenex tissue  Before applying antibiotic ointment and a Band-Aid.   You need to report immediately  if  any signs of infection develop.    

## 2024-05-10 NOTE — Assessment & Plan Note (Signed)
 Scalp - see procedure

## 2024-05-10 NOTE — Assessment & Plan Note (Addendum)
 Chronic Zolpidem  prn - not working any longer Will try Halcion prn  Potential benefits of a long term benzodiazepines  use as well as potential risks  and complications were explained to the patient and were aknowledged.

## 2024-05-10 NOTE — Assessment & Plan Note (Signed)
Cont on [employer]

## 2024-05-10 NOTE — Assessment & Plan Note (Signed)
 Better

## 2024-05-10 NOTE — Assessment & Plan Note (Signed)
F/u w/Dr Cristal Deer Remote STENT Chronic No angina On Crestor, Eliquis

## 2024-05-24 DIAGNOSIS — H2513 Age-related nuclear cataract, bilateral: Secondary | ICD-10-CM | POA: Diagnosis not present

## 2024-06-03 DIAGNOSIS — H43813 Vitreous degeneration, bilateral: Secondary | ICD-10-CM | POA: Diagnosis not present

## 2024-06-03 DIAGNOSIS — H25813 Combined forms of age-related cataract, bilateral: Secondary | ICD-10-CM | POA: Diagnosis not present

## 2024-06-25 DIAGNOSIS — I10 Essential (primary) hypertension: Secondary | ICD-10-CM | POA: Diagnosis not present

## 2024-06-25 DIAGNOSIS — H25811 Combined forms of age-related cataract, right eye: Secondary | ICD-10-CM | POA: Diagnosis not present

## 2024-06-28 ENCOUNTER — Telehealth: Payer: Self-pay | Admitting: *Deleted

## 2024-06-28 DIAGNOSIS — K626 Ulcer of anus and rectum: Secondary | ICD-10-CM | POA: Diagnosis not present

## 2024-06-28 NOTE — Telephone Encounter (Signed)
 Patient with diagnosis of afib on Eliquis  for anticoagulation.    Procedure: FLEX SIGMOIDOSCOPY  Date of procedure: 07/14/24   CHA2DS2-VASc Score = 4   This indicates a 4.8% annual risk of stroke. The patient's score is based upon: CHF History: 0 HTN History: 1 Diabetes History: 0 Stroke History: 0 Vascular Disease History: 1 Age Score: 2 Gender Score: 0    CrCl 54 mL/min Platelet count 129    Per office protocol, patient can hold Eliquis   for 2 days prior to procedure.     **This guidance is not considered finalized until pre-operative APP has relayed final recommendations.**

## 2024-06-28 NOTE — Telephone Encounter (Signed)
 Please advise holding Eliquis  prior to flex sigmoidoscopy on 07/14/2024.  Thank you!  DW

## 2024-06-28 NOTE — Telephone Encounter (Signed)
   Pre-operative Risk Assessment    Patient Name: Andrew Joseph.  DOB: 1946-03-01 MRN: [id number]   Date of last office visit: 04/12/24 DR. BRIDGETTE CHRISTOPHER Date of next office visit: 08/17/24 DR. BRIDGETTE CHRISTOPHER   Request for Surgical Clearance    Procedure:  FLEX SIGMOIDOSCOPY  Date of Surgery:  Clearance 07/14/24                                Surgeon: DR. ROSALYNN POTTIER Surgeon's Group or Practice Name:  [facility]  Phone number:  [phone] Fax number:  [phone]   Type of Clearance Requested:   - Medical  - Pharmacy:  Hold Apixaban  (Eliquis )     Type of Anesthesia:  Not Indicated   Additional requests/questions:    [name]   06/28/2024, 1:22 PM

## 2024-06-28 NOTE — Telephone Encounter (Signed)
 Dr. Lonni,   You saw this patient on 04/12/2024. Per office protocol, will you please comment on medical clearance for flex sigmoidoscopy? The pharmacy team will be contacted regarding recommendations to hold Eliquis .  Please route your response to P CV DIV Preop. I will communicate with requesting office once you have given recommendations.   Thank you!  Barnie Hila, NP

## 2024-07-02 ENCOUNTER — Telehealth: Payer: Self-pay

## 2024-07-02 NOTE — Telephone Encounter (Signed)
 PT HAS A 2ND CLEARANCE REQUEST IN THE SYSTEM WITH A DATE OF 07/14/24 FOR ANOTHER SURGEONS OFFICE      Pre-operative Risk Assessment    Patient Name: Andrew Joseph.  DOB: 31-Dec-1945 MRN: [id number]   Date of last office visit: 04/12/24 SHELDA BRUCKNER, MD Date of next office visit: 08/17/24 SHELDA BRUCKNER, MD  Request for Surgical Clearance    Procedure:  EXCISIONAL BIOPSY OF A MAXILLARY GINGIVAL LESION  Date of Surgery:  Clearance TBD                                Surgeon:  OZELL HURDLE, DDS, MD Surgeon's Group or Practice Name:  [facility] Phone number:  [phone] Fax number:  [phone]   Type of Clearance Requested:   - Medical  - Pharmacy:  Hold Apixaban  (Eliquis ) 48 HOURS PRIOR   Type of Anesthesia:  Not Indicated   Additional requests/questions:    Signed, Lucie DELENA Ku   07/02/2024, 5:06 PM

## 2024-07-05 NOTE — Telephone Encounter (Signed)
     Primary Cardiologist: Shelda Bruckner, MD  Chart reviewed as part of pre-operative protocol coverage. Given past medical history and time since last visit, based on ACC/AHA guidelines, Sinan Tuch. would be at acceptable risk for the planned procedure without further cardiovascular testing.   Procedure: FLEX SIGMOIDOSCOPY  Date of procedure: 07/14/24     CHA2DS2-VASc Score = 4   This indicates a 4.8% annual risk of stroke. The patient's score is based upon: CHF History: 0 HTN History: 1 Diabetes History: 0 Stroke History: 0 Vascular Disease History: 1 Age Score: 2 Gender Score: 0     CrCl 54 mL/min Platelet count 129      Per office protocol, patient can hold Eliquis   for 2 days prior to procedure.  I will route this recommendation to the requesting party via Epic fax function and remove from pre-op pool.  Please call with questions.  Josefa HERO. Mackenzi Krogh NP-C     07/05/2024, 10:43 AM [facility] [address] Office [phone] Fax [phone]

## 2024-07-06 NOTE — Telephone Encounter (Signed)
 Patient with diagnosis of atrial fibrillation on Eliquis  for anticoagulation.    Procedure:  EXCISIONAL BIOPSY OF A MAXILLARY GINGIVAL LESION   Date of Surgery:  Clearance TBD      CHA2DS2-VASc Score = 6   This indicates a 9.7% annual risk of stroke. The patient's score is based upon: CHF History: 0 HTN History: 1 Diabetes History: 0 Stroke History: 2 Vascular Disease History: 1 Age Score: 2 Gender Score: 0   Chart states small brain bleed in 2007 w/questionable stroke  CrCl 54 Platelet count 129  Patient has not had an Afib/aflutter ablation within the last 3 months or DCCV within the last 30 days  Patient does not require pre-op antibiotics for dental procedure.  Per office protocol, patient can hold Eliquis  for 2 days prior to procedure.   Patient will not need bridging with Lovenox (enoxaparin) around procedure.  **This guidance is not considered finalized until pre-operative APP has relayed final recommendations.**

## 2024-07-06 NOTE — Telephone Encounter (Signed)
 Daisy with [facility] is following up requesting updates on clearance. She has patient called today trying to expedite procedure due to being in severe pain. Please advise.

## 2024-07-06 NOTE — Telephone Encounter (Signed)
 Per surgeons office pt would like to expedite procedure. Will route to the Preop Pool for review.

## 2024-07-06 NOTE — Telephone Encounter (Signed)
   Patient Name: Andrew Joseph.  DOB: 1946-03-14 MRN: [id number]  Primary Cardiologist: Shelda Bruckner, MD  Chart reviewed as part of pre-operative protocol coverage.  Patient was contacted today regarding excisional biopsy of a maxillary gingival lesion.  Patient reports that he is doing very well, today he denies chest pain, shortness of breath, lower extremity edema, fatigue, palpitations, melena, hematuria, hemoptysis, diaphoresis, weakness, presyncope, syncope, orthopnea, and PND.  He is able to achieve greater than 4 METS of activity.    Given past medical history and time since last visit, based on ACC/AHA guidelines, Andrew Pickup. is at acceptable risk for the planned procedure without further cardiovascular testing.   Patient does not require pre-op antibiotics for dental procedure.   Per office protocol, patient can hold Eliquis  for 2 days prior to procedure.  Please resume as soon as safe to do so from a bleeding standpoint.   The patient was advised that if he develops new symptoms prior to surgery to contact our office to arrange for a follow-up visit, and he verbalized understanding.  I will route this recommendation to the requesting party via Epic fax function and remove from pre-op pool.  Please call with questions.  Andrew Holcomb D Jyllian Haynie, NP 07/06/2024, 11:41 AM

## 2024-07-09 DIAGNOSIS — C059 Malignant neoplasm of palate, unspecified: Secondary | ICD-10-CM | POA: Diagnosis not present

## 2024-07-22 DIAGNOSIS — C069 Malignant neoplasm of mouth, unspecified: Secondary | ICD-10-CM | POA: Diagnosis not present

## 2024-07-31 DIAGNOSIS — C069 Malignant neoplasm of mouth, unspecified: Secondary | ICD-10-CM | POA: Diagnosis not present

## 2024-08-03 ENCOUNTER — Other Ambulatory Visit

## 2024-08-04 DIAGNOSIS — C050 Malignant neoplasm of hard palate: Secondary | ICD-10-CM | POA: Diagnosis not present

## 2024-08-10 ENCOUNTER — Ambulatory Visit: Admitting: Internal Medicine

## 2024-08-11 DIAGNOSIS — C050 Malignant neoplasm of hard palate: Secondary | ICD-10-CM | POA: Diagnosis not present

## 2024-08-16 NOTE — H&P (View-Only) (Signed)
 Chief complaint:   Chief Complaint  Patient presents with  . Consult    (R) maxillary and alveolar ridge mass Biopsy 08/11/24 Invasive squamous cell carcinoma, keratinizing type, moderately differentiated. Growing over the last few months      Initial visit- History of present illness:  He  has been referred by AndrewBritt.   Andrew Joseph is a 78 y.o. with past medical history significant for atrial fibrillation ( on elliquis )  presenting for evaluation of SCC of the right maxillary alveolar ridge.      Reports having dental implants He then presented to his dentist with maxillary pain.  Eventually a mass was noted and he was referred to [facility], who obtained a biopsy.  He was then seen by AndrewBritt who sent the patient to us .   Today he reports the mass has grown significantly recently.  He is having trouble talking and eating He is only taking liquids. He is losing weight.   He lives by himself. He has very little social support.  He is a retired Firefighter   . Functional status: able to climb stairs?  Yes   Alcohol use:  reports that he does not currently use alcohol.   Tobacco:  reports that he has never smoked. He has never used smokeless tobacco.   History  . Past Medical History: Medical History[1] .   New patient sheets reviewed/scanned in epic    . Past Surgical History:Surgical History[2]    New patient sheets reviewed/scanned in epic  . Medications: Medication list is documented in [facility] and was reviewed. He  has a current medication list which includes the following prescription(s): chlorhexidine, labetalol , zolpidem , amiodarone , eliquis , irbesartan -hydrochlorothiazide , and rosuvastatin .  . Allergies: Allergies[3] New patient sheets reviewed/scanned in epic  . Family history: family history is not on file.     New patient sheets reviewed/scanned in epic  . Social History      City: [address] [place] [address]       The patient is  here today alone      Living arrangements - the patient lives by himself. Does not have great support system      Employment :  retired Investment banker, corporate       Alcohol use:  reports that he does not currently use alcohol.        Tobacco:  reports that he has never smoked. He has never used smokeless tobacco.    Review of Systems  The patient was asked about symptoms referable to the constitutional, respiratory, cardiovascular, gastrointestinal, genitourinary, musculoskeletal, endocrine, hematologic/lymphatic, allergy/immunologic, eyes, otorhinolaryngologic, dermatologic, neurologic, and psychiatric systems. These were negative with the exception of those described in the HPI and PMH, as well as pertinent positives/negatives listed below.     Physical Examination  Blood pressure 131/61, pulse 57, resp. rate 18, height 1.727 m (5' 8), weight 64.4 kg (142 lb), SpO2 99%.  The head and neck examination included the following elements which were found to be within normal limits unless otherwise noted below: general appearance, mood and affect, quality of voice, inspection of head and face, cranial nerve examination, external ears, ear canals, and tympanic membranes, external nose, nasal mucosa, septum and turbinates, lips, oral cavity,oropharynx, larynx, pharyngeal walls and pyriform sinuses, cervical lymphatics,  neck and thyroid  bed, pupils and extraocular motion, carotid pulses bilaterally, temporomandibular joint, respiratory assessment for effort, stridor, lung symmetry, and retractions, rashes or lesions on head or neck, and facial nerve function.  Salient findings:  Large fungating mass off the right maxilla, crossing midline Fullness of the right pre-maxillary soft tissue Right handed Left allen - ok for radial flap         No obvious palpable neck nodes   Data reviewed   Pathology   Final Diagnosis  A. Outside Case Consultation/Review; [facility] [place], [place]);  [id number], Specimen Collection Date: 07/09/2024:   RIGHT HARD PALATE, BIOPSY: Invasive squamous cell carcinoma, keratinizing type, moderately differentiated.    Electronically signed by Jon Ronnald Nephew, MD on 08/12/2024 at 1111 ED      Imaging  07/31/24 CT neck Images and report was reviewed      IMPRESSION:  1. There is a 2.1 x 3.1 x 1.8 cm enhancing soft tissue density mass along the anterior maxillary alveolar ridge on the right that presumably correlates with patient's tumor. Of note, there is extensive deep bony invasion into and possibly through the subjacent alveolar ridge. Some destructive bony changes extending to the anterior inferior margin of the right maxillary sinus also noted.  2.  Small subcentimeter lymph nodes within the bilateral neck are strictly nonspecific. No separative or necrotic adenopathy.  3.  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     Impression/Plan  1. SCC of the right maxillary alveolar ridge - Bx of the mass showed SCC - CT neck showed a erosive lesion of the right maxilla involving the pre-maxillary soft tissue - on exam today ( 2 weeks from his CT scan), the mass appears very large  - We will obtain staging CT neck and chest with contrast - pre-op evaluation - CTA of the legs for possible fibula flap  We discussed treatment options: A. Surgery followed by adjuvant XRT +/- chemotherapy B. Definitive chemoradiation C. Palliative approach  Option A is considered standard of treatment for oral cavity SCC  Surgery will involve: bilateral infrastructure maxillectomy, possible facial skin resection, bilateral neck dissection, tracheostomy, osteocutaneous vs soft tissue flap, midface plating, split thickness skin graft  We discussed surgical outcomes, perioperative recovery period, need for DHT vs G tube, possible need for prolonged tracheostomy.   He lives alone and would like to consider rehab  facility after surgery  We had extensive discussion about the risk, benefit and alternatives to the procedure. After discussion,  the patient has elected to proceed.  Risks of this procedure have been discussed. These risks include but are not limited to, risks of anesthesia such as heart attack, stroke, death; risks of any surgery such as bleeding, infection, blood clots, pneumonia; risks of this particular surgery including:  risks of any tumor surgery, such as loss of bodily function, recurrence, need for further procedures and/or other treatments  risks of the maxillary cancer resection, including changes to speech, swallowing, taste, and manipulation of food in the mouth, facial deformity, drooling, facial numbness.   - risks of the neck dissection were discussed with the patient including, poor cosmesis, wound infection, shoulder weakness, adhesive capsulitis, pain, chylous fistula (at risk  in level IV/V and IV neck dissection), cranial nerve deficits (XI: shoulder dysfunction, X: hoarseness, swallowing difficulties, aspiration, IX: swallowing difficulties, XII: speech changes, swallowing difficulties, VII: marginal mandibular branch weakness and rare total facial paralysis unless the neck disease is high in zone II); anesthesia (numbness) of the neck skin, earlobe, and possibly the tongue; lymphedema, anesthetic risks (stroke, death, MI); possibility of need for further surgery or other treatment (XRT/chemotherapy).  Risks of tracheostomy were discussed which  include but are not limited to bleeding, scar,  infection, difficulty with speech, difficulty swallowing, pneumothorax ,pneumomediastinum, subcutaneous emphysema, damage to the esophagus, injury to the recurrent laryngeal nerve, accidental decannulation, tracheal stenosis, A frame deformity, tracheoesophageal fistula, tracheocutaneous fistula, tracheoinnominate fistula with catastrophic bleeding.  risks to the leg such as numbness,  weakness of the lower leg or feet, distal ischemia, need for skin graft, poor cosmetic outcome; risk of the anastomosis including thrombosis or flap failure            [1] Past Medical History: Diagnosis Date  . High cholesterol   . Hypertension   . Prostate cancer    (CMD)   [2] Past Surgical History: Procedure Laterality Date  . PROSTATECTOMY    [3] Allergies Allergen Reactions  . Iodine Palpitations    Affected my heart some how  . Amlodipine  Swelling  . Shellfish Containing Products Rash    Turns red

## 2024-08-17 ENCOUNTER — Ambulatory Visit (HOSPITAL_BASED_OUTPATIENT_CLINIC_OR_DEPARTMENT_OTHER): Admitting: Cardiology

## 2024-08-17 DIAGNOSIS — C069 Malignant neoplasm of mouth, unspecified: Secondary | ICD-10-CM | POA: Diagnosis not present

## 2024-08-17 NOTE — Progress Notes (Incomplete)
 Cardiology Office Note:  .    Date:  08/17/2024  ID:  Andrew KANDICE Jerrye Mickey., DOB 06-17-1946, MRN [id number] PCP: Garald Karlynn GAILS, MD   [facility] Providers Cardiologist:  Shelda Bruckner, MD Electrophysiologist:  Eulas FORBES Furbish, MD     History of Present Illness: .    Andrew Joseph. is a 78 y.o. male with a hx of CAD with remote stent, paroxysmal atrial fibrillation/flutter who is seen for follow up. I initially met him 09/07/2019 as a new consult at the request of [name], Karlynn GAILS, MD for the evaluation and management of CAD.   CV history: distal LAD stent, last cath 2011-09-01 Father died of MI age 63. Hypercholesterolemia: prior LDL 124. Was told in the past his total cholesterol was over 400.    Cardiovascular risk factors: Prior clinical ASCVD: CAD with PCI to LAD. ?CVA in August 31, 2006 (quarter sized bleed in his brain--lightheadedness/dizziness), unclear ?seizure event 2011/09/01 Comorbid conditions: hypertension, hyperlipidemia Prior cardiac testing: cath 2010, 2012. Echo 2014-08-31. MPI 1997, 200009/30/0209-30-03   Arrhythmia history: He established care with EP Dr. Furbish 03/14/23. He was scheduled for ablation in 31-Aug-2024. In the interim he was started on amiodarone  underwent cardioversion. He cancelled ablation that was scheduled for 08/07/23.  Today: Overall doing well. Pending major dental work, will hold apixaban  for several days prior, which is fine. Has not had bleeding issues other than dental extractions.   Initial BP elevated today, was stressful to get here. Improved on recheck.   Hasn't noticed any symptoms of afib recently. Discussed signs/symptoms of both afib and MI, when to seen emergency medical care. Asking about asymptomatic screening, discussed at length. Able to walk several miles at a time if needed.   ROS:  Denies chest pain, shortness of breath at rest or with normal exertion. No PND, orthopnea, LE edema or unexpected weight gain. No syncope or  palpitations. ROS otherwise negative except as noted.   Studies Reviewed: SABRA       No new today  Physical Exam:    VS:  There were no vitals taken for this visit.   Wt Readings from Last 3 Encounters:  05/10/24 162 lb (73.5 kg)  04/12/24 168 lb 6.4 oz (76.4 kg)  02/06/24 161 lb (73 kg)    GEN: Well nourished, well developed in no acute distress HEENT: Normal, moist mucous membranes NECK: No JVD CARDIAC: regular rhythm, normal S1 and S2, no rubs or gallops. No murmur. VASCULAR: Radial and DP pulses 2+ bilaterally. No carotid bruits RESPIRATORY:  Clear to auscultation without rales, wheezing or rhonchi  ABDOMEN: Soft, non-tender, non-distended MUSCULOSKELETAL:  Ambulates independently SKIN: Warm and dry, no edema NEUROLOGIC:  Alert and oriented x 3. No focal neuro deficits noted. PSYCHIATRIC:  Normal affect    ASSESSMENT AND PLAN: .    Atrial flutter/atrial fibrillation, now paroxysmal -CHA2DS2/VAS Stroke Risk Points= 4  -no bleeding issues, continue apixaban . Ok to hold briefly for procedures. -tolerating amiodarone , started 03/2023. We have discussed ablation and other antiarrhythmics, after shared decision making will continue amiodarone . Thyroid , LFTs stable in last 6 mos, will order today to have drawn prior to next follow up -lasix /potassium only PRN as he has swelling when in afib   CAD with history of prior stent Hypercholesterolemia -asymptomatic.  -stopped aspirin  with start of apixaban  -continue rosuvastatin  20 mg daily -LDL goal <70, last 125. We have discussed options for changing therapy/management. Per patient preference: declines PCSK9i/additional therapy -asking about asymptomatic testing, discussed guidelines  at length -reviewed red flag warning signs that need immediate medical attention   Hypertension: Overall goal <130/80 -typically elevated on initial reading here but improves on recheck -continue irbesartan -HCTZ 150-12.5 mg daily -trialed carvedilol ,  BP not well controlled, returned to labetalol  -continue labetalol  300 mg BID (more commonly takes 150 mg BID depending on his blood pressure) -has been on amlodipine  in the past if additional agent needed in the future. Alternative would be to increase irbesartan -HCTZ dose to maximum dosing.    Cardiac risk counseling and prevention recommendations: -recommend heart healthy/Mediterranean diet, with whole grains, fruits, vegetable, fish, lean meats, nuts, and olive oil. Limit salt. -recommend moderate walking, 3-5 times/week for 30-50 minutes each session. Aim for at least 150 minutes.week. Goal should be pace of 3 miles/hours, or walking 1.5 miles in 30 minutes -recommend avoidance of tobacco products. Avoid excess alcohol.  Dispo: Follow-up in 4 months per patient preference, or sooner as needed.  Signed, Shelda Bruckner, MD  Shelda Bruckner, MD, PhD, [facility]  [facility]  [place]  [facility] at [facility] at [facility] [address] [place], [place] [address] [phone]

## 2024-08-24 DIAGNOSIS — C069 Malignant neoplasm of mouth, unspecified: Secondary | ICD-10-CM | POA: Diagnosis not present

## 2024-08-27 DIAGNOSIS — R1311 Dysphagia, oral phase: Secondary | ICD-10-CM | POA: Diagnosis not present

## 2024-08-27 DIAGNOSIS — C069 Malignant neoplasm of mouth, unspecified: Secondary | ICD-10-CM | POA: Diagnosis not present

## 2024-08-27 DIAGNOSIS — Z7901 Long term (current) use of anticoagulants: Secondary | ICD-10-CM | POA: Diagnosis not present

## 2024-08-27 DIAGNOSIS — I10 Essential (primary) hypertension: Secondary | ICD-10-CM | POA: Diagnosis not present

## 2024-08-27 DIAGNOSIS — I2510 Atherosclerotic heart disease of native coronary artery without angina pectoris: Secondary | ICD-10-CM | POA: Diagnosis not present

## 2024-08-27 DIAGNOSIS — I480 Paroxysmal atrial fibrillation: Secondary | ICD-10-CM | POA: Diagnosis not present

## 2024-08-27 DIAGNOSIS — R918 Other nonspecific abnormal finding of lung field: Secondary | ICD-10-CM | POA: Diagnosis not present

## 2024-08-27 DIAGNOSIS — E785 Hyperlipidemia, unspecified: Secondary | ICD-10-CM | POA: Diagnosis not present

## 2024-08-27 DIAGNOSIS — Z8673 Personal history of transient ischemic attack (TIA), and cerebral infarction without residual deficits: Secondary | ICD-10-CM | POA: Diagnosis not present

## 2024-08-27 DIAGNOSIS — Z79899 Other long term (current) drug therapy: Secondary | ICD-10-CM | POA: Diagnosis not present

## 2024-08-27 DIAGNOSIS — R569 Unspecified convulsions: Secondary | ICD-10-CM | POA: Diagnosis not present

## 2024-08-31 DIAGNOSIS — R633 Feeding difficulties, unspecified: Secondary | ICD-10-CM | POA: Diagnosis not present

## 2024-08-31 DIAGNOSIS — Z4659 Encounter for fitting and adjustment of other gastrointestinal appliance and device: Secondary | ICD-10-CM | POA: Diagnosis not present

## 2024-08-31 DIAGNOSIS — J449 Chronic obstructive pulmonary disease, unspecified: Secondary | ICD-10-CM | POA: Diagnosis not present

## 2024-08-31 DIAGNOSIS — K121 Other forms of stomatitis: Secondary | ICD-10-CM | POA: Diagnosis not present

## 2024-08-31 DIAGNOSIS — E8779 Other fluid overload: Secondary | ICD-10-CM | POA: Diagnosis not present

## 2024-08-31 DIAGNOSIS — C770 Secondary and unspecified malignant neoplasm of lymph nodes of head, face and neck: Secondary | ICD-10-CM | POA: Diagnosis not present

## 2024-08-31 DIAGNOSIS — C4442 Squamous cell carcinoma of skin of scalp and neck: Secondary | ICD-10-CM | POA: Diagnosis not present

## 2024-08-31 DIAGNOSIS — R1311 Dysphagia, oral phase: Secondary | ICD-10-CM | POA: Diagnosis not present

## 2024-08-31 DIAGNOSIS — E1122 Type 2 diabetes mellitus with diabetic chronic kidney disease: Secondary | ICD-10-CM | POA: Diagnosis not present

## 2024-08-31 DIAGNOSIS — C4440 Unspecified malignant neoplasm of skin of scalp and neck: Secondary | ICD-10-CM | POA: Diagnosis not present

## 2024-08-31 DIAGNOSIS — R1310 Dysphagia, unspecified: Secondary | ICD-10-CM | POA: Diagnosis not present

## 2024-08-31 DIAGNOSIS — I130 Hypertensive heart and chronic kidney disease with heart failure and stage 1 through stage 4 chronic kidney disease, or unspecified chronic kidney disease: Secondary | ICD-10-CM | POA: Diagnosis not present

## 2024-08-31 DIAGNOSIS — N189 Chronic kidney disease, unspecified: Secondary | ICD-10-CM | POA: Diagnosis not present

## 2024-08-31 DIAGNOSIS — D62 Acute posthemorrhagic anemia: Secondary | ICD-10-CM | POA: Diagnosis not present

## 2024-08-31 DIAGNOSIS — I351 Nonrheumatic aortic (valve) insufficiency: Secondary | ICD-10-CM | POA: Diagnosis not present

## 2024-08-31 DIAGNOSIS — C410 Malignant neoplasm of bones of skull and face: Secondary | ICD-10-CM | POA: Diagnosis not present

## 2024-08-31 DIAGNOSIS — E8809 Other disorders of plasma-protein metabolism, not elsewhere classified: Secondary | ICD-10-CM | POA: Diagnosis not present

## 2024-08-31 DIAGNOSIS — Z930 Tracheostomy status: Secondary | ICD-10-CM | POA: Diagnosis not present

## 2024-08-31 DIAGNOSIS — C030 Malignant neoplasm of upper gum: Secondary | ICD-10-CM | POA: Diagnosis not present

## 2024-08-31 DIAGNOSIS — C069 Malignant neoplasm of mouth, unspecified: Secondary | ICD-10-CM | POA: Diagnosis not present

## 2024-08-31 DIAGNOSIS — Z01818 Encounter for other preprocedural examination: Secondary | ICD-10-CM | POA: Diagnosis not present

## 2024-08-31 DIAGNOSIS — I5022 Chronic systolic (congestive) heart failure: Secondary | ICD-10-CM | POA: Diagnosis not present

## 2024-08-31 DIAGNOSIS — I2510 Atherosclerotic heart disease of native coronary artery without angina pectoris: Secondary | ICD-10-CM | POA: Diagnosis not present

## 2024-09-01 DIAGNOSIS — C410 Malignant neoplasm of bones of skull and face: Secondary | ICD-10-CM | POA: Diagnosis not present

## 2024-09-01 DIAGNOSIS — C030 Malignant neoplasm of upper gum: Secondary | ICD-10-CM | POA: Diagnosis not present

## 2024-09-01 DIAGNOSIS — C770 Secondary and unspecified malignant neoplasm of lymph nodes of head, face and neck: Secondary | ICD-10-CM | POA: Diagnosis not present

## 2024-09-01 DIAGNOSIS — C069 Malignant neoplasm of mouth, unspecified: Secondary | ICD-10-CM | POA: Diagnosis not present

## 2024-09-01 NOTE — Interval H&P Note (Signed)
-------------------------------------------------------------------------------   Attestation signed by Francyne Soles, MD at 09/03/2024  9:42 PM I saw and evaluated the patient and reviewed the resident's note. I agree with the resident's findings and plan.   Electronically signed by: Francyne Soles, MD 09/03/2024 9:41 PM  -------------------------------------------------------------------------------  The surgical history has been reviewed and remains accurate without interval change. The patient was re-examined and patient's physiologic condition has not changed significantly in the last 30 days. The condition still exists that makes this procedure necessary. The treatment plan remains the same, without new options for care. No new pharmacological allergies or types of therapy has been initiated that would change the plan or the appropriateness of the plan. The patient and/or family understand the potential benefits and risks.

## 2024-09-04 ENCOUNTER — Other Ambulatory Visit: Payer: Self-pay | Admitting: Internal Medicine

## 2024-09-06 DIAGNOSIS — C069 Malignant neoplasm of mouth, unspecified: Secondary | ICD-10-CM | POA: Diagnosis not present

## 2024-09-07 DIAGNOSIS — C4442 Squamous cell carcinoma of skin of scalp and neck: Secondary | ICD-10-CM | POA: Diagnosis not present

## 2024-09-08 DIAGNOSIS — C4442 Squamous cell carcinoma of skin of scalp and neck: Secondary | ICD-10-CM | POA: Diagnosis not present

## 2024-09-09 DIAGNOSIS — C4442 Squamous cell carcinoma of skin of scalp and neck: Secondary | ICD-10-CM | POA: Diagnosis not present

## 2024-09-09 NOTE — Telephone Encounter (Signed)
 Subject: Formal Expedited Authorization Request for Acute Inpatient Rehabilitation Requesting Physician: Ryan Moats, MD   NPI: [id number]  (MD Rationale for Expedited Authorization is attached) Expedited Initial Authorization Request  Preservice Authorization request - Inpatient Rehabilitation Facility (IRF) Preadmission Screening Form is attached - this includes patient's name, current location, current level of function, goals, and expectations for IRF Admission.  Also included are chart documents for the current setting patient is in. [facility] firmly believes this patient meets ALL the CMS IRF Guidelines for admission which state: There must be a reasonable expectation that the patient meets all the following requirements at the time of the patients' admission to IRF. Requires the active and ongoing therapeutic intervention of multiple therapy disciplines (PT, OT, SLP, or Orthotics) one of which must be Physical or Occupational therapy. Generally requires and can reasonably be expected to actively participate in, and benefit from, an intensive rehabilitation therapy program. Under current standards, this intensive rehab therapy program generally consists of at least 3 hours of therapy (PT, OT, SP, or orthotics) per day at least 5 days per week. Is sufficiently stable at the time of admission to the IRF to be able to actively participate in the intensive rehabilitation program that is described. Requires physician supervision by a rehabilitation physician, defined as a licensed with a specialized training and experience in inpatient rehab. CMS requires a preadmission screening tool to be completed by a clinician and approved by the rehabilitation physician. CMS states "We are placing more weight on the rehab physician's decision to admit the patient to the IRF." The [facility] physician has approved this patient for admission and we, respectfully,  request your concurrence that this patient meets criteria.   Initial Authorization: Expedited (Fast) - Dr. Moats feels the above patient's health and recovery will be adversely affected if required to wait the standard timeframe for authorization and the recommended Acute Inpatient Rehab is further delayed.  Reason for Expedited Request: Dr. Moats and the patient strongly agree delay in transfer to the required Inpatient Rehabilitation Hospital will put in jeopardy the patient's health and recovery. This could lead to further decline and complications. Dr. Moats feels the patient meets Medicare Criteria for Acute Inpatient Rehab and will benefit from Acute inpatient Rehab that cannot be accomplished at a lower level of care. We formally request an expedited initial authorization. Please review the clinical information sent with this request and information received via this fax.  Request: On behalf of the patient, please provide me a copy of all medical records, other documents and clinical guidelines used to render your decision if denied. Thank you.

## 2024-09-10 DIAGNOSIS — C4442 Squamous cell carcinoma of skin of scalp and neck: Secondary | ICD-10-CM | POA: Diagnosis not present

## 2024-09-11 DIAGNOSIS — C4442 Squamous cell carcinoma of skin of scalp and neck: Secondary | ICD-10-CM | POA: Diagnosis not present

## 2024-09-11 NOTE — Progress Notes (Signed)
 ------------------------------------------------------------------------------- Attestation signed by Gini Dennise Pinto, MD at 09/13/2024  2:48 PM I independently saw and evaluated the patient on 09/11/24. I reviewed the clinical data in EHR and discussed the assessment/Plan with the resident and agree with plan of care as documented with editions as below if needed/pertinent.   Amritpal Pannu MD Hospitalist attending.     -------------------------------------------------------------------------------  [facility] Internal Medicine Consult Note  Admission Date: 09/01/2024  Date: 09/11/2024 Reason for Consult: hypertension   Hospital Course:  Andrew Barich. is a 78 y.o. 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   He tolerated the procedure well with an EBL of 1100 mL and received 2 L of LR, 3 L of plasmalyte, 2 units of pRBC. He was admitted to the SICU for monitoring. He was transferred out of the ICU on 10/2. EGS was consulted and patient has planned G tube placement tomorrow, 10/7. His Eliquis  has been held since 10/2.   At the time of initial evaluation, patient reports feeling well without acute complaints. He denies pain. BP has been elevated, ranging between 160/53 to as high as 198/52. He has bradycardia to 50's. Otherwise HDS. He denies chest pain, dyspnea, headaches, vision changes. He does have peripheral edema that is worsening.   He has been receiving only 50 mg losartan  daily until today when it was increased to 150 mg, hydrochlorothiazide  12.5 mg daily (started yesterday), and PRN IV hydralazine 20 mg x1. He has not been  receiving his BB due to bradycardia. He reports chronic bradycardia in the 50's even as an outpatient.   Update:  General Medicine signed off on 10/10 with final BP recommendations provided.  Primary team reached out again today to reengage us  about patient's continued peripheral edema.  BP has remained fluctuating between 120-150 mmHg systolic.  Urine output yesterday 1 L with Lasix  40 mg p.o. BID.  Creatinine has improved to 0.8 (from 1.1).  Albumin still low at 2.4. Per RN, skin graft site on LLE has been leaking clear fluid through the bandage.    ASSESSMENT/PLAN: Andrew Spikes. is a 78 y.o. 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 Internal Medicine is consulted for elevated blood pressure.    #Asymptomatic hypertension  #Hypoalbuminemia  #Peripheral edema  -- He has been asymptomatic from BP standpoint -- Has been hypervolemic on exam with mildly elevated BNP to 300.  Initially received IV Lasix  which was then titrated to maintenance dosing p.o. Lasix  40 mg once daily. Primary team increased dosing to BID  yesterday.  -- Likely also having fluid retention from his low albumin, and this will take time to improve now that tube feeds are started via G tube. As he continues to mobilize with PT, this should also improve. -- Given patient's stable blood pressures and acceptable UOP,  PLAN: - IV lasix  40 mg x1 now  - Please obtain BMP and BNP tomorrow am with labs  - Our team  will re-assess need for IV diuretics tomorrow  - Apply compression stocking on RLE  - Continue daily mobilization with PT or RN as able  - Continue losartan  100 mg daily  - Continue hydralazine 25 mg  PO TID. Would recommend discontinuing on discharge     The patient was discussed with Dr. Lynnea who directed the plan of care. General medicine will SIGN OFF. Please re-engage us  if any questions or concerns arise while patient is still admitted.    Electronically signed by: Isaiah Mallie Counts, MD 09/11/2024 3:30 PM   OBJECTIVE: Vital Signs: Temp:  [97.3 F (36.3 C)-98.1 F (36.7 C)] 97.3 F (36.3 C) Heart Rate:  [57-74] 64 Resp:  [16-18] 16 BP: (122-163)/(44-61) 153/45    Intake/Output Summary (Last 24 hours) at 09/11/2024 1530 Last data filed at 09/11/2024 1243 Gross per 24 hour  Intake 700 ml  Output 800 ml  Net -100 ml      CONSTITUTIONAL: alert and oriented x3, no acute distress MOUTH/THROAT: moist mucous membranes NECK: s/p closed tracheotomy, staples in place  CARDIOVASCULAR: normal rate, regular rhythm; no murmurs appreciated RESPIRATORY: comfortable on room air, normal respiratory effort, trace bibasilar crackles  ABDOMEN: normal bowel sounds in all 4 quadrants, soft, non-tender, non-distended, G tube in left abdomen  NEURO: CN II-XII grossly intact, spontaneously moves all extremities, no focal deficits noted MSK:  2+ lower extremity edema noted, 1+ dependent upper extremity edema in forearms  SKIN: warm, dry    Labs, radiology images, medication and microbiology have been reviewed and can be viewed in the EMR. Select results detailed.   CBC  Recent Labs    09/11/24 0607  WBC 9.40  RBC 2.78*  HGB 8.5*  HCT 24.8*  MCV 89.2  MCH 30.7  MCHC 34.4  RDW 17.3*  PLT 201  MPV 8.6    BMP  Recent Labs    09/11/24 0607  NA 140  K 3.5  CL 104  CO2 31  BUN 33*  CREATININE 0.87  CALCIUM  7.5*    Radiology Results (last 72 hours)     ** No results found for the last 72 hours. **       Electronically signed by: Isaiah Mallie Counts, MD 09/11/2024 3:30 PM

## 2024-09-13 NOTE — Telephone Encounter (Signed)
 Medical record collection - see flowsheet

## 2024-09-15 NOTE — Procedures (Signed)
-------------------------------------------------------------------------------   Attestation signed by Francyne Soles, MD at 09/15/2024 10:44 PM The patient was noted to have small dehiscence in the posterior flap with a granular lesion in the soft palate. Given how quickly his tumor grew, the area is somewhat concerning. I recommended a biopsy and we obtained that today  I performed the entire procedure.   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 1cc of 1% Lidocaine  with Epinephrine  1:100,000 was injected into the area of concern. Using the 4mm punch, several small samples of tissue were removed and sent for pathology. Hemostasis was achieved using a combination of silver nitrate and manual pressure. The patient tolerated the procedure well.

## 2024-09-15 NOTE — Progress Notes (Addendum)
 Case Management Update  Date: 09/15/2024   Time: 1:28 PM   Patient Type: Inpatient      Post Acute Placement Status: Review currently in progress.  Case Management Coordination Status: Coordination In-Progress    Anticipated Discharge Location: Home  If Plan A discharging location is not feasible: Potential Plan B: Home with Home Health  CM called [employer] spoke with [place], appeal still in review, can take 1-2 additional days.   CM spoke with team and pt, plan for tomorrow, if insurance hasn't given us  a response, then would plan to dc tomorrow. Pt and family agreeable, they have home set up and do not feel he needs additional DME. Preference HH agency is [facility], CM sent to [place] to see if they can accept [facility], checkin on [place] branch. Per family, will be going to [address] [place] [address] if he ends up not going to [place]. Family agreeable to conversation and plan  CM called all reps on file to see if they would still take pt if appeal ends up getting overturned for approval. Left voicemail on main line.   CM sent referral to [facility] with [facility], waiting on response. CM sent to [facility] with [facility]  CM received a call from [place], can take him if medically ready and appeal is overturned.    [facility] not contracted, CM spoke to pt and family, no other preferences. Choice form completed. CM sent to [facility]. [facility] not in network. CM contacted Becky with [facility], they will check and call back  [facility] accepted for HHPT, SN, OT, and Speech therapy, not HH aide unfortunately.    Andrew Gaskins, RN

## 2024-09-16 NOTE — Progress Notes (Signed)
   09/16/24 1400  Encounter Information  Time Initiated 1400  Time Spend (min.) 55  Encounter Type Initial  Provider Type CPE resident  Contact With Patient;Care team;Family  Referral Source Other (Comment) (Rounding)  Referral Reason Patient (Support)  Spiritual Context  Patient Behavioral/Emotional State Appropriate;Persevering;Pleasant;Shame;Vulnerable  Spiritual Strengths Sense of trust/divine care;Awareness of the divine  Spiritual/Religious Practices Connection with family/friends;Prayer;Reading religious texts  Spiritual Encounter  Spiritual Issues/Themes Connecting experiences and beliefs;Coping with illness/injury;Relationship with God;Trauma  Spiritual Outcomes Engaged spiritual practices;Oriented to chaplaincy services;Processed emotions  Spiritual Interventions Engaged in life review;Explored belief system;Identified strengths/potential;Helped reframe experience;Prayer  Plan/Recommendations/Referral  Plan Services complete. Consult for further needs.

## 2024-09-16 NOTE — Progress Notes (Signed)
 Acute Physical Therapy Treatment  Visit Count: PT Visit Count: 6  Precautions: Other Therapy Precautions: Fall risk, Other (Comment), Seizures Comment: aspiration precautions; feeding tube; fx boot LLE  Medical Diagnosis/Course: PT Diagnosis/Course PT Medical Dx: Oral cancer Pertinent Medical Course: Per progress note 10/13 Andrew Joseph. is a 78 y.o. male who  has a past medical history of Acute sinusitis, Allergic rhinitis, Atrial fibrillation    (CMD), Atrial flutter    (CMD), Bronchitis, CAD involving native coronary artery without angina pectoris, Callus, Colon polyps, Cough, CRI (chronic renal insufficiency), DOE (dyspnea on exertion), Dyslipidemia, ED (erectile dysfunction), Hardening of the aorta (main artery of the heart), Hemorrhoids, internal, with bleeding, High cholesterol, History of coronary angioplasty with insertion of stent, Hypertension, Hypokalemia, Insomnia, persistent, Leg cramp, Neoplasm of uncertain behavior of skin, Ocular migraine, Pain and swelling of right lower leg, Prostate cancer    (CMD), Rash, Seizure    (CMD), Shoulder pain, left, Shoulder pain, right, Skin lesion of neck, 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 who was taken to the OR on 10/2 for maxillectomy, BND, L fibula free flap, L STSG, trach with Dr. Murry. G-tube placed 10/7, wound vac and leg drains removed. Patient capped successfully and decannulated PM of 10/9 and remaining drains removed. Stoma sutured and patient transitioned to PO lasix  in preparation for discharge to rehab as patient's fluid status continues to improve. Doppler stitch and every 3rd staple removed 10/10.  Assessment and Plan  Assessment: Pt was willing and motivated to work with PT today. Pt oriented and  able to follow all commands appropriately during session. Pt presents with improvements in functional mobility and activity tolerance during session. Pt performed multiple sit <> stand transfers with CGA-minA using BUE support and then one UE support when standing. Pt ambulated ~60 feet x2 reps with CGA using RW. Pt mildly unsteady on his feet with increased reliance noted on RW for stability. Increased assistance needed for stability when patient was not using RW and increased assistance needed for standing from chair without using BUE support. Pt's gait speed was 0.48 m/s indicating patient is a high falls risk and limited household ambulator. Pt required increased time for boot application and shoe application with up to minA needed for correct boot application with all straps attached appropriately. Extended time spent education patient on discharge recommendations and goals of PT. Rest breaks needed throughout session due to increased WOB and fatigue with mobility. Pt is a higher falls risk based on performance during session. Pt presents during PT session with deficits in strength, endurance, standing balance, activity tolerance, and gait stability. Pt was limited during session by fatigue. Pt scored a 18/24 on the [facility] Mobility 6 Click Assessment indicating patient demonstrated moderate impairment for functional mobility during PT session. Pt will continue to benefit from skilled acute PT services to further address deficits noted during PT session. Continue with PT POC. Pt is far from baseline levels of functioning, has great family support, is a high falls risk, and can tolerate 3 hours of therapy daily indicating he is a great candidate for post acute rehab.  Problem list: Impairments/Limitations: Activity Tolerance Deficits, Ambulation Deficits, Balance Deficits, Bed mobility deficits, Mobility deficits, Muscle weakness, Transfer deficits   PT Recommendation: PT Recommendation: Can tolerate 3 hrs of  therapy, Rehabilitation Facility (if pt  goes home, recommend increased assistance with mobility and HHPT services) PT Equipment Recommended: Walker-rolling (if goes home)   Home Living Environment: Type of Home House  Home Layout Multi-level (could move bedroom to main level to begin if needed but has no shower on main level)  Tax inspector - number 5  Exterior Stairs - Psychologist, sport and exercise - number 12-13  Interior Stairs - rails Left going up  [employer] / Tub Tub/Shower Unit  [employer] Regular toilet height  Clinical biochemist Currently Using none PTA  Additional Comments     Prior Level of Function: Level of Independence Independent  Lives With Alone  Person(s) able to assist at d/c Daughter reports pt can have as much assist as needed upon discharge per PT eval  Patient Responsibilities Driving, Home management, Manage Finances, Personal ADLs, Shopping, Manage Medications, Meal Preparation, Social Participation  Requires Assist With     PT Goals:  Patient and Family Goals: return to PLOF Treatment Plan/Goals Established with Patient/Caregiver: Yes  Encounter Problems     Encounter Problems (Active)     Physical Therapy     Patient will go supine to/from sit Independently     Start:  09/03/24    Expected End:  10/15/24      10/16: not assessed today ALF       Patient will perform sit-to-stand Independently using Walker-rolling     Start:  09/03/24    Expected End:  10/15/24      10/16: CGA for transfers ALF       Patient will ambulate 300 feet Independently using Walker-rolling     Start:  09/03/24    Expected End:  10/15/24      10/6 - 30 feet 2x with swedish walker - Min A x 1. Second person assist for line management only 10/16: ambulated ~60 feet x2 with CGA using RW ALF      Patient will go up/down 5 stairs Independently using unilateral rails     Start:  09/03/24    Expected End:  10/15/24      10/16:  not addressed today ALF       Patient will improve dynamic standing balance to good     Start:  09/03/24    Expected End:  10/15/24      10/16: CGA-minA using RW for dynamic standing activities ALF      Patient/caregiver will be able to verbalize 2 fall prevention strategies to decrease risk of falls during functional mobility     Start:  09/03/24    Expected End:  10/15/24      10/16: addressed falls risk with patient ALF      Pt will increase score on SPPB to at least 11/12 to demonstrate improved safety and independence with all standing and ambulation activities.      Start:  09/03/24    Expected End:  10/15/24      10/16: scored 6/12 ALF          Rehab Potential:  Rehab Potential: Good Complexity/Co-morbidities that Impact POC: Motivation, Pain, Reduced activity level, Time since onset/Exacerbation Impairments/Limitations: Activity Tolerance Deficits, Ambulation Deficits, Balance Deficits, Bed mobility deficits, Mobility deficits, Muscle weakness, Transfer deficits  Plan:  Patient's Prior Level of Function Independent Independent / Mod I (AMPAC 22-24)     (3pts)   Patient's Current Level of Function (18) Needs a Little Help (AMPAC 12-21)    (2pts)  Likelihood for functional decline without therapy .  Fairly Likely    (2pts)  Likelihood  to make significant functional gains with therapy. Fairly Likely    (2pts)  Acute therapy likely to change discharge disposition. Fairly Likely    (2pts)   Recommended Frequency: 5-6 times a week (>12 points total)  PT Frequency: 5x week  Recommend Duration:  For 3 weeks  Recommend Interventions: Patient/Caregiver education, Therapeutic activity, Therapeutic exercise, Gait/mobility training, Self care home management, Neuromuscular re-education   SUBJECTIVE  Interdisciplinary Team Communication Communication: Patient, Nursing   General Family/Caregiver Present: alone Patient subjective: I will do whatever you want me to  do.  Subjective: I will do whatever you want me to do.  Pain: Pain Assessment Pain Assessment: 0-10 Pain Score  : 1 (unable to rate pain) Pain Type: Acute pain Pain Location: Leg Pain Orientation: Left Pain Descriptors: Sore Pain Frequency: Constant/Continuous Pain Onset: Gradual Clinical Progression: Gradually worsening Pain Intervention(s): Distraction, Elevated, Ambulation/Increased activity, Repositioned, RN notified (Comment), Rest, Environmental changes, Emotional support   OBJECTIVE  Cognition Orientation Level: Oriented x4 Affect/Behavior: Impacted therapy session Affect/Behavior: Anxious Safety Awareness: Intact  Vitals:  Stable during session  Balance: Sitting - Static: Distant supervision Sitting - Dynamic: Close supervision Standing - Static: Contact Guard Assistance Standing - Dynamic: Contact Guard Assistance, Minimal Assistance Balance Additional Comments: Able to maintain sitting EOB balance with SBA for lower body ADLs of putting shoes and boot on prior to OOB mobility. pt did require up to minA for boot application and positioning. Pt able to maintain standing balance with CGA-minA using RW. See SPPB for more details.     Interventions: Therapeutic Activity:  Bed Mobility Brace Applied: Yes Brace Type: camboot Donning/Doffing Brace: Minimal Assistance, Extra time, Verbal Cues Transfers Assistive Device: Walker-rolling Sit to Stand: Geophysicist/field seismologist Stand to Sit: [employer] Transfers  Additional Comments: Pt performed sit <> stand transfer from chair using BUE support with CGA using RW. Pt performed 5x sit <> stand transfer from chair using one hand with minA and RW. Pt attempted to perform sit <> stand transfer from chair without arm support however was unable to achieve full standing with maxA. Cues provided for proper hand placement when standing and eccentric control when sitting. Gait Training: Gait Deviation: Decreased  Stance Time-Left, Decreased Gait Speed, Cadence- Decreased, Base of Support-narrow, Decreased stride length -Left, Forward Flexion, Shuffle Assistive Device: Walker-rolling Gait Distance (ft): 60 ft Gait Assistance: Contact Guard Assistance Gait Additional Comments: Pt ambulated ~60 feet x2 reps with CGA using RW. Decreased cadence and step length throughout with a step to pattern. Reduced foot clearance noted on LLE  with reduced stance time and step length. Narrow BOS noted throughout with forward flexed posture. Increased reliance on RW during ambulation as pt became fatigued. Mildly unsteady however had no LOB episodes. Cues provided to stand up tall within walker and for proper RW use.      Patient educated on: PT POC, role of PT, importance of OOB mobility and progressing mobility, activity pacing, deep breathing, safety with mobility, orientation, proper device use, discharge recommendations, goals of PT, proper boot application and positioning   Outcomes AM-PAC - Basic Mobility:    Flowsheet Row Most Recent Value  AM-PAC 6-Clicks - Basic Mobility  Turning from you back to your side while in a flat bed without using bed rails? None  Moving from lying on your back to sitting on the side of a flat bed without using bed rails?  A little  Moving to and from a bed to a chair (including a wheelchair)? A little  Standing up from a chair using your arms (e.g, wheelchair, or bedside chair)? A little  To walk in a hospital room? A little  Climbing 3-5 steps with a railing? A lot  AM-PAC Total Score 18   Gait Speed Gait Velocity Gait Velocity: 0.48   SPPB Short Physical Performance Battery (SPPB) Side by Side Stand Time Score: Held for 10 sec Semi Tandem Stand Time Score: Held for 10 sec Tandem Stand Time: Held for 3 to 9.99 seconds >>Total Balance Tests score: 3 Time for first 4-meter walk: More than 8.70 seconds Aids for first walk: Rolling walker Time for second 4-meter walk: 6.21 to  8.70 seconds Aids for second walk: Rolling walker Time for the shorter of the two walks: 8.33 >>Total Gait Speed Test Scoring: 2 Safe to stand without help: Yes Single Chair Stand Results: Participant used arms to stand or Test not completed >>Repeated chair test score: 1 - 16.70 seconds or more Score: 6   Condition After Therapy:  Up in chair, Nursing notified of condition, All needs within reach, Fall interventions in place, Lines intact, Heels floated  Treatment Times Time In: 1125 Time Out: 1210 Time in Timed codes: 45 Total Treatment Time: 45     Treatment/Procedures Time Entry Therapeutic Activity minutes: 45   Charges           09/16/2024   Code Description Service Provider Modifiers Quantity  [id number] Hc Pt Therapeut Actvity Direct Pt Contact Each 15 Min Rosina Ezzard Greet, PT GP 3        Time of Service Note Type Status  None

## 2024-09-16 NOTE — Progress Notes (Signed)
 Acute Rehab Consult Request  Appropriateness: Liaison to continue to follow patient to determine appropriateness for acute rehab hospitalization Details: Patient with new concerns of wound and disease progression; pending further work up. CM initiated appeal remains pending response. Patient not yet medically appropriate for IPR. Acute Rehab Liaison Contact Info: Earon Rivest [phone]

## 2024-09-17 NOTE — Discharge Summary (Signed)
 ------------------------------------------------------------------------------- Attestation signed by Francyne Soles, MD at 09/17/2024  4:31 PM I saw and evaluated the patient and reviewed the resident's note. I agree with the resident's findings and plan.   Electronically signed by: Francyne Soles, MD 09/17/2024 4:31 PM  -------------------------------------------------------------------------------  Otolaryngology Discharge Summary  Patient ID: Andrew Joseph. [id number] 78 y.o. [birth year]  Admit date: 09/01/2024 Admitting Physician: Francyne Soles, MD Admission Condition: good Admission Diagnoses:   Present on Admission: . Primary oral squamous cell carcinoma    (CMD) . Squamous cell carcinoma of head and neck   Discharge date and time: 09/17/2024 at approximately 11:31 AM Discharge Physician: Francyne Soles, MD  Discharged Condition: good Discharge Diagnoses:  Principal Problem:   Primary oral squamous cell carcinoma    (CMD) Active Problems:   Squamous cell carcinoma of head and neck Resolved Problems:   * No resolved hospital problems. *  Procedures/Surgeries performed during hospitalization:  Tracheostomy (31600) Bilateral modified radical neck dissection (38724,bilateral) Bilateral infrastructure maxillectomy (68774 bilateral) Maxillary jaw reconstruction with Stryker recon plating - 2mm Stryker recon plate (78755) Complex vestibuloplasty (59154) 6.    Left osteocutaneous fibula free flap reconstruction of mandible with a 9cm x 9cm cm rectangular shaped skin paddle (CPT 20969) Microvascular anastomosis: a. Left peroneal artery to right  facial artery b. Left peroneal vena comitante #1 to branch of right internal juguar vein with 3.0 mm coupler C. Left peroneal vena comitante #2 to branch of right external juguar vein with 3.5. mm coupler 7 . Split-thickness skin graft resurfacing of left leg skin paddle defect measuring 11 cm x 11 cm ( 121 cm squared) ( CPT 15120 per  100sqcm, CPT 15121 each add 100sqcm) Wound vac placement 8. Dobhoff tube placement (44500) LAPAROSCOPIC GASTROSTOMY TUBE PLACEMENT (Abdomen)  Indication for Admission: Post-operative observation   Hospital Course: Emad Brechtel. is a 78 y.o. male who  has a past medical history of Acute sinusitis, Allergic rhinitis, Atrial fibrillation    (CMD), Atrial flutter    (CMD), Bronchitis, CAD involving native coronary artery without angina pectoris, Callus, Colon polyps, Cough, CRI (chronic renal insufficiency), DOE (dyspnea on exertion), Dyslipidemia, ED (erectile dysfunction), Hardening of the aorta (main artery of the heart), Hemorrhoids, internal, with bleeding, High cholesterol, History of coronary angioplasty with insertion of stent, Hypertension, Hypokalemia, Insomnia, persistent, Leg cramp, Neoplasm of uncertain behavior of skin, Ocular migraine, Pain and swelling of right lower leg, Prostate cancer    (CMD), Rash, Seizure    (CMD), Shoulder pain, left, Shoulder pain, right, Skin lesion of neck, 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 who was taken to the OR on 10/2 for maxillectomy, BND, L fibula free flap, L STSG, trach with Dr. Soles. They were taken to the PACU immediately following the procedure and admitted to the SICU for post-operative monitoring. Pt did well in SICU and was transferred to floor on POD 2. Continuous TF started POD 1 and pt advanced to bolus TF. 10/6 TF held at [facility] for GT placement 10/7. G-tube placed 10/7, wound vac and leg drains removed. Patient capped successfully and decannulated PM of 10/9 and remaining drains removed. Stoma sutured and patient transitioned to PO lasix  in preparation for discharge to rehab as patient's fluid status continues to  improve. Doppler stitch and every 3rd staple removed 10/10. Patient insurance denied IPR  so patient will remain inpatient over weekend. Currently waiting on insurance approval for IPR. Medicine continuing to monitor fluid status, s/p IV lasix  80 mg x 2 10/12, 10/13, 10/14. TTE obtained per medicine to eval for HF, showing LVEF 55-60%. Per medicine, continue to eval fluid status, no need for further IV diuresis at this time, ok to stop hydralazine and start hydrochlorothiazide .  SLP was consulted for a swallowing eval which patient was cleared for pured diet however due to possible wound dehiscence diet was held and patient will continue tube feeds at this time.  Patient continued to remain stable throughout the rest of his hospital stay.  Patient was noted to have a new ulcer on his palate on 10/15 which a biopsy was obtained and a CT scan.  The biopsy showed no signs of recurrence.  Will plan to discharge today home after IPR appeal was denied.  Follow-ups arranged with ENT, dentistry, radiation, med onc next week.  At the time of discharge, the patient was afebrile, in no acute distress and vital signs were stable. New discharge medications were discussed in detail and the patient stated understanding of use and administration. The patient verbalized understanding of all discharge instructions and therefore was released.  Consults: Treatment Team:  Surgeon: Francyne Soles, MD Surgeon: Rudell Cory, DMD Surgeon: Laurel Cheral Monte, MD  Significant Diagnostic Studies:   As above  Discharge Exam: General: male, NAD Respiratory: stoma sutured strong voice. Minimal secretions Head and Neck: Neck soft, flat. Incision(s) to L neck cdi. Trach stoma site sutured and c/d/I w/o dehiscence.               Free Flap: No signs of hematoma, fluid collection, or dehiscence.               Free Flap Donor Site: L fibula site with w/o dressing in place, healing well              Skin Graft Donor Site: calf  with bandage in place without strikethrough             CN II-IV grossly intact Enteral Access: G-tube in place Extremities: warm, well perfused. Moving all equally, no gross deficits.  Disposition: home  Discharge Medications and Instructions:  Discharge Medications:   Medication List     PAUSE taking these medications    labetaloL  300 mg tablet Wait to take this until your doctor or other care provider tells you to start again. Until follow up with PCP Commonly known as: NORMODYNE  Take 150 mg by mouth 2 (two) times a day.       START taking these medications    acetaminophen  500 mg tablet Commonly known as: TYLENOL  Take 2 tablets (1,000 mg total) by mouth every 6 (six) hours for 10 days. *Avoid acetaminophen  (Tylenol ) from other sources. Maximum of 4,000mg  per day* (Take 2 tablets (1,000 mg total) by mouth every 6 (six) hours for 10 days.)   aspirin  81 mg EC tablet Take 1 tablet (81 mg total) by mouth daily for 14 days. Start taking on: September 18, 2024   calcium  citrate 950 mg (200 mg calcium ) Tab Commonly known as: CALCITRATE Take 1 tablet (950 mg total) by mouth daily. Start taking on: September 18, 2024   famotidine  20 mg tablet Commonly known as: PEPCID  Take 1 tablet (20 mg total) by mouth 2 (two) times a day.       CONTINUE taking these medications    amiodarone  200 mg tablet Commonly known  as: PACERONE  Take 200 mg by mouth daily.   Eliquis  5 mg Tab Generic drug: apixaban  Take 5 mg by mouth 2 (two) times a day.   irbesartan -hydroCHLOROthiazide  150-12.5 mg per tablet Commonly known as: AVALIDE Take 1 tablet by mouth daily.   rosuvastatin  20 mg tablet Commonly known as: CRESTOR  Take 20 mg by mouth daily.   zolpidem  10 mg tablet Commonly known as: AMBIEN  Take 10 mg by mouth nightly.       STOP taking these medications    diphenhydrAMINE 50 mg capsule Commonly known as: BENADRYL   predniSONE  50 mg tablet Commonly known as: DELTASONE           Where to Get Your Medications     These medications were sent to [facility] [address] [place] [address]    Hours: Open Monday 12am to Friday 11:59pm; Sat-Sun: Closed; Holidays: Closed Thanksgiving Phone: [phone]  acetaminophen  500 mg tablet aspirin  81 mg EC tablet calcium  citrate 950 mg (200 mg calcium ) Tab famotidine  20 mg tablet    See AVS for patient instructions. Discharge Orders     Ambulatory referral to Medical Oncology     Details:    Reason for Referral: Solid Tumor   Referring for Leukemia?: No   Referring for Transplant or Cell Therapy?: No   DETAILED AND EXTENSIVE ORAL EVALUATION - PROBLEM FOCUSED, BY REPORT     Home Health Skilled Nursing     Details:    Home Health Skilled Nursing Service:  Education Wound Care     Education on Wound Care: Yes   Monitoring for Healing: Yes   Education Needed: Other (see comment) Comment - tube feeds   Comments: Apply bacitracin to incision twice daily Peridex solution QID, Please have oral swabs at bedside  Tube feed education: Nutren 2 @ 250 ml x 4 times daily, 30 ml water after each feedings   Occupational Therapy Home Health Coordination     Details:    Actions:  Evaluate and Treat Home Safety Evaluation     Therapy Frequency: 2-3x/week   Physical Therapy Home Health Coordination     Details:    Actions:  Evaluate and Treat Home Safety Evaluation     Therapy Frequency: 2-3x/week   Speech Therapy Home Health Coordination      Details:    Actions: Evaluate and Treat   Tube Feeds Coordination     Details:    Enteral Access: G-Tube   Size: 18 fr mic-key 2.7 cm   Method of Administration: Syringe (30/month B4034)   Enteral Options: Formula   Formula Name: Nutren 2 or equivalent   Feeding Schedule: Nutren 2 @ 250 x 4 times daily   Water Flushes: 30 ml water flushes after each feeding, extra 390 ml fluid daily   Length of need: Other (please specify)   Ambulatory  referral to Radiation Oncology    Additional instructions:   [facility] office) - Tube Feed Phone: [phone]      Scheduled Future Appointments       Provider Department Dept Phone Center   09/21/2024 9:00 AM [facility] [place] [facility] [place] - MPM ENT [phone] [facility] [place]   09/21/2024 11:30 AM CONSULT DENTIST [facility]  - DENTISTRY [phone] [facility] [place]   09/21/2024 2:00 PM [place] [facility] - Radiation Oncology [phone] [facility] Comp [place]   09/21/2024 3:00 PM [employer] [place] [facility]  [facility] [place] [address] Hematology Oncology [phone] [facility] Comp [place]       The patient was instructed to call if he develops a fever greater than 101.5, any drainage from the incision (when applicable), redness extending from his incision, unable to void, or any other questions or concerns.    Electronically signed by: Asberry Gaines Ferrari, PA-C 09/17/2024 11:31 AM

## 2024-09-17 NOTE — Progress Notes (Signed)
 Case Management Discharge Note        CSN: [id number] DOB: February 10, 1946 Service: Ear/Nose/Throat Location: C918/A  Patient Class: Inpatient  DC Disposition: : Home Health Care  Discharge DC Disposition: : Home Health Care Homecare Referral: (SN) Skilled Nursing, (ST) Speech Therapy, (PT) Physical Therapy, (OT) Occupational Therapy Homecare/Hospice Agency(s) chosen: [facility] Infusion Referral : [facility] Referral Agency(s) Chosen: [facility]  Discharge Referrals Patient Preference: Chosen geographical local area/county shared with patient/family: Yes Date chosen geographical local area/county list shared with patient/family: 09/03/24 Patient Preference for Post-Acute Provider Form completed: Yes Case closed, patient/family agree with disposition plan: Yes    CM received notification that [employer] upheld IPR denial. CM notified pt, pt's son, unit staff, and primary team.    Patient discharged to son's residence ([address] [place] [address]) with [facility], PT, OT, and ST services through [facility] as well as TF ([facility]) through [facility]. CM notified Rhoda [facility] Liaison [phone], of patient's planned discharge today. CM notified Flavia BROCKS., [facility] liaison [phone], of pt's DC today; Nadine to speak with pt/family at the bedside. Patient able to arrange own discharge transportation. Pt and pt's son are amenable to discharge plan. DME company and [facility] agency information added to AVS. CN and primary RN aware of above discharge plan; aware to send pt with 3 days worth of TF supplies/formula.     Case Management Coordination Status: Coordination Complete     Andrew Eveleen Hummer, RN Office: [phone]

## 2024-09-17 NOTE — Progress Notes (Signed)
   09/17/24 1351  PT Treatment Type  Visit Type Treatment  Reason Eval/Treat Not Completed  Reason Eval/Treat Not Completed Other   Pt  with active DC orders in chart. PT to defer at this time pending DC.

## 2024-09-17 NOTE — Care Plan (Signed)
 Problem: Health Behavior: Goal: Knowledge of disease or condition will improve by discharge Outcome: Progressing Goal: Understanding of proper healthcare maintenance will be met by discharge Description: Understanding of proper healthcare maintenance will be met by discharge Outcome: Progressing Goal: Verbalization of signs and symptoms of infection will improve by discharge Outcome: Progressing   Problem: Coping: Goal: Ability to verbalize feelings will improve by discharge Outcome: Progressing Goal: Ability to verbalize ways to enhance comfort will improve by discharge Outcome: Progressing   Problem: Fluid Volume: Goal: Ability to maintain a balanced intake and output will improve by discharge Description: Ability to maintain a balanced intake and output will improve by discharge Outcome: Progressing   Problem: Health Behavior Goal: Understanding of treatment plan will improve by discharge Outcome: Progressing Goal: Compliance with treatment plan for underlying cause of condition will improve by discharge Outcome: Progressing Goal: Identification of resources available to assist in meeting health care needs will improve by discharge Outcome: Progressing Goal: Ability to care for self will improve by discharge Outcome: Progressing   Problem: Health Behavior: Goal: MCB Ability to state ways to decrease the risk of falls will be met by discharge Description: Ability to state ways to decrease the risk of falls will improve by discharge Outcome: Progressing   Problem: Safety: Goal: Will remain free from falls by discharge Description: Will remain free from falls by discharge Outcome: Progressing   Problem: Knowledge Deficit Goal: Patient/family/caregiver demonstrates understanding of disease process, treatment plan, medications, and discharge instructions Description: Complete learning assessment and assess knowledge base. Outcome: Progressing   Problem: Compromised Skin  Integrity Goal: Skin integrity is maintained or improved Description: Assess and monitor skin integrity. Identify patients at risk for skin breakdown on admission and per policy. Collaborate with interdisciplinary team and initiate plans and interventions as needed.    Outcome: Progressing Goal: Fluid and electrolyte balance are achieved/maintained Description: Assess and monitor vital signs (orthostatic vitals if applicable), fluid intake and output, urine color, labs, skin turgor, mucous membranes, jugular venous distention, edema, circumference of edematous extremities and abdominal girth, respiratory status, and mental status.  Monitor for signs and symptoms of hypovolemia (tachycardia, rapid breathing, decreased urine output, postural hypotension, confusion, syncope).  Monitor for signs and symptoms of hypervolemia (strong rapid pulse, shortness of breath, difficulty breathing lying down, crackles heard in lung fields, edema). Collaborate with interdisciplinary team and initiate plan and interventions as ordered. Outcome: Progressing Goal: Nutritional status is improving Description: Monitor and assess patient for malnutrition (ex- brittle hair, bruises, dry skin, pale skin and conjunctiva, muscle wasting, smooth red tongue, and disorientation). Collaborate with interdisciplinary team and initiate plan and interventions as ordered.  Monitor patient's weight and dietary intake as ordered or per policy. Utilize nutrition screening tool and intervene per policy. Determine patient's food preferences and provide high-protein, high-caloric foods as appropriate.  Outcome: Progressing   Problem: Pain Goal: Improvement in pain assessment Outcome: Progressing   Problem: INFECTION - ADULT Goal: Absence of infection during hospitalization Description: INTERVENTIONS: 1. Assess and monitor for signs and symptoms of infection 2. Monitor lab/diagnostic results 3. Monitor all insertion sites i.e.,  indwelling lines, tubes and drains 4. Monitor endotracheal (as able) and nasal secretions for changes in amount and color 5. Institute appropriate cooling/warming therapies per order 6. Administer medications as ordered 7. Instruct and encourage patient and family to use good hand hygiene technique 8. Identify and instruct in appropriate isolation precautions for identified infection/condition Outcome: Progressing Goal: Absence of fever/infection during anticipated neutropenic period Description: INTERVENTIONS  1. Monitor WBC 2. Administer growth factors as ordered 3. Implement neutropenic guidelines as ordered Outcome: Progressing   Problem: DISCHARGE PLANNING Goal: Discharge to home or other facility with appropriate resources Description: INTERVENTIONS: 1. Identify barriers to discharge w/pt and caregiver 2. Arrange for needed discharge resources and transportation as appropriate 3. Identify discharge learning needs (meds, wound care, etc) 4. Arrange for interpreters to assist at discharge as needed 5. Refer to Case Management Department for coordinating discharge planning if the patient needs post-hospital services based on physician order or complex needs related to functional status, cognitive ability or social support system Outcome: Progressing   Problem: Chronic Conditions and Co-Morbidities Goal: Patient's chronic conditions and co-morbidity symptoms are monitored and maintained or improved Description: INTERVENTIONS: 1. Monitor and assess patient's chronic conditions and comorbid symptoms for stability, deterioration, or improvement 2. Collaborate with multidisciplinary team to address chronic and comorbid conditions and prevent exacerbation or deterioration 3. Update acute care plan with appropriate goals if chronic or comorbid symptoms are exacerbated and prevent overall improvement and discharge Outcome: Progressing

## 2024-09-18 DIAGNOSIS — I10 Essential (primary) hypertension: Secondary | ICD-10-CM | POA: Diagnosis not present

## 2024-09-18 DIAGNOSIS — I2510 Atherosclerotic heart disease of native coronary artery without angina pectoris: Secondary | ICD-10-CM | POA: Diagnosis not present

## 2024-09-18 DIAGNOSIS — Z483 Aftercare following surgery for neoplasm: Secondary | ICD-10-CM | POA: Diagnosis not present

## 2024-09-18 DIAGNOSIS — E7800 Pure hypercholesterolemia, unspecified: Secondary | ICD-10-CM | POA: Diagnosis not present

## 2024-09-18 DIAGNOSIS — I4891 Unspecified atrial fibrillation: Secondary | ICD-10-CM | POA: Diagnosis not present

## 2024-09-18 DIAGNOSIS — C4442 Squamous cell carcinoma of skin of scalp and neck: Secondary | ICD-10-CM | POA: Diagnosis not present

## 2024-09-18 DIAGNOSIS — I4892 Unspecified atrial flutter: Secondary | ICD-10-CM | POA: Diagnosis not present

## 2024-09-18 DIAGNOSIS — C61 Malignant neoplasm of prostate: Secondary | ICD-10-CM | POA: Diagnosis not present

## 2024-09-18 DIAGNOSIS — Z431 Encounter for attention to gastrostomy: Secondary | ICD-10-CM | POA: Diagnosis not present

## 2024-09-19 NOTE — Progress Notes (Signed)
 PAL Call  Contacted by patient's family member regarding left calf pain. Patient is s/p maxillectomy and reconstruction with left fibular flap on 10/1. Discharge on 10/17. Since discharge, patient notes soreness of the left calf that has become more noticeable with time. Denies firmness to touch, calf asymmetry, discoloration, wound dehiscence, change in sensation/color/warmth of distal extremity. He has been walking intermittently but not putting much stress on the leg itself. He has been taking tylenol  only for pain.   Will provide short course refill of oxycodone  to take in addition to tylenol  until follow up. I discussed this pain may be related to surgery, however, it is not typical to develop new pain this far out without significant stressor. I advised the patient and family to present to a local ED ASAP in the event pain continues to worsen or they notice new calf asymmetry, color change, or concern regarding distal extremity. They expressed understanding and will seek medical care if these symptoms arise. Follow up scheduled with Dr. Murry 10/21.   Electronically signed by: Lucie Con Janetta Austin 09/19/2024 9:30 AM

## 2024-09-20 ENCOUNTER — Telehealth: Payer: Self-pay | Admitting: *Deleted

## 2024-09-20 DIAGNOSIS — E7800 Pure hypercholesterolemia, unspecified: Secondary | ICD-10-CM | POA: Diagnosis not present

## 2024-09-20 DIAGNOSIS — I10 Essential (primary) hypertension: Secondary | ICD-10-CM | POA: Diagnosis not present

## 2024-09-20 DIAGNOSIS — I4892 Unspecified atrial flutter: Secondary | ICD-10-CM | POA: Diagnosis not present

## 2024-09-20 DIAGNOSIS — C030 Malignant neoplasm of upper gum: Secondary | ICD-10-CM | POA: Diagnosis not present

## 2024-09-20 DIAGNOSIS — Z431 Encounter for attention to gastrostomy: Secondary | ICD-10-CM | POA: Diagnosis not present

## 2024-09-20 DIAGNOSIS — R1319 Other dysphagia: Secondary | ICD-10-CM | POA: Diagnosis not present

## 2024-09-20 DIAGNOSIS — C4442 Squamous cell carcinoma of skin of scalp and neck: Secondary | ICD-10-CM | POA: Diagnosis not present

## 2024-09-20 DIAGNOSIS — Z483 Aftercare following surgery for neoplasm: Secondary | ICD-10-CM | POA: Diagnosis not present

## 2024-09-20 DIAGNOSIS — C61 Malignant neoplasm of prostate: Secondary | ICD-10-CM | POA: Diagnosis not present

## 2024-09-20 DIAGNOSIS — I4891 Unspecified atrial fibrillation: Secondary | ICD-10-CM | POA: Diagnosis not present

## 2024-09-20 DIAGNOSIS — E43 Unspecified severe protein-calorie malnutrition: Secondary | ICD-10-CM | POA: Diagnosis not present

## 2024-09-20 DIAGNOSIS — I2510 Atherosclerotic heart disease of native coronary artery without angina pectoris: Secondary | ICD-10-CM | POA: Diagnosis not present

## 2024-09-20 NOTE — Transitions of Care (Post Inpatient/ED Visit) (Signed)
 09/20/2024  Name: Andrew Joseph. MRN: [id number] DOB: 11/19/1946  Today's TOC FU Call Status: Today's TOC FU Call Status:: Successful TOC FU Call Completed TOC FU Call Complete Date: 09/20/24 Patient's Name and Date of Birth confirmed.  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 Explained to request medications to be transferred to preferred pharmacy.)  Items Reviewed: Did you receive and understand the discharge instructions provided?: Yes Medications obtained,verified, and reconciled?: Yes (Medications Reviewed) Any new allergies since your discharge?: No Dietary orders reviewed?: Yes Type of Diet Ordered:: Tube feedings Do you have support at home?: Yes People in Home [RPT]: child(ren), adult Name of Support/Comfort Primary Source: Discharged home with Son to [place]   Medications Reviewed Today: Medications Reviewed Today     Reviewed by Lucky Andrea LABOR, RN (Registered Nurse) on 09/20/24 at 1414  Med List Status: <None>   Medication Order Taking? Sig Documenting Provider Last Dose Status Informant  acetaminophen  (TYLENOL ) 500 MG tablet [id number] Yes Take 1,000 mg by mouth every 6 (six) hours. Take for 10 days [provider]  Active   amiodarone  (PACERONE ) 200 MG tablet [id number] Yes Take 1 tablet (200 mg total) by mouth daily. Lonni Slain, MD  Active   aspirin  EC 81 MG tablet [id number]  Yes Take 81 mg by mouth daily. Swallow whole. [provider]  Active   calcium  citrate (CALCITRATE - DOSED IN MG ELEMENTAL CALCIUM ) 950 (200 Ca) MG tablet [id number] Yes Take 200 mg of elemental calcium  by mouth daily. [provider]  Active   ELIQUIS  5 MG TABS tablet [id number] Yes TAKE 1 TABLET BY MOUTH TWICE A DAY Webb, Padonda B, FNP  Active   famotidine  (PEPCID ) 20 MG tablet [id number] Yes Take 20 mg by mouth 2 (two) times daily. [provider]  Active   fluticasone  (FLONASE ) 50 MCG/ACT nasal spray [id number]  Place 1 spray into both nostrils daily.  Patient not taking: Reported on 09/20/2024   Hazen Darryle BRAVO, FNP  Active   irbesartan -hydrochlorothiazide  (AVALIDE) 150-12.5 MG tablet [id number] Yes TAKE 1 TABLET BY MOUTH DAILY [name], Aleksei V, MD  Active   labetalol  (NORMODYNE ) 300 MG tablet [id number]  TAKE 1 TABLET BY MOUTH 3 TIMES DAILY  Patient not taking: Reported on 09/20/2024   Lonni Slain, MD  Active   oxyCODONE  (OXY IR/ROXICODONE ) 5 MG immediate release tablet [id number] Yes Take 5 mg by mouth every 6 (six) hours as needed for severe pain (pain score 7-10). [provider]  Active   potassium chloride  (KLOR-CON ) 8 MEQ tablet [id number]  Take 1 tablet (8 mEq total) by mouth daily.  Patient not taking: Reported on 09/20/2024   [name], Aleksei V, MD  Active   rosuvastatin  (CRESTOR ) 20 MG tablet [id number] Yes TAKE 1 TABLET BY MOUTH DAILY [name], Aleksei V, MD  Active   triazolam  (HALCION ) 0.25 MG tablet [id number]  Take 1-2 tablets (0.25-0.5 mg total) by mouth at bedtime as needed for sleep.  Patient not taking: Reported on 09/20/2024   [name], Karlynn GAILS, MD  Active  Home Care and Equipment/Supplies: Were Home Health Services Ordered?: Yes Name of Home Health Agency:: [facility] Has Agency set up a time to come to your home?: Yes First Home Health Visit Date: 09/20/24 Any new equipment or medical supplies  ordered?: No  Functional Questionnaire: Do you need assistance with bathing/showering or dressing?: No Do you need assistance with meal preparation?: No Do you need assistance with eating?: No Do you have difficulty maintaining continence: No Do you need assistance with getting out of bed/getting out of a chair/moving?: No Do you have difficulty managing or taking your medications?: No  Follow up appointments reviewed: PCP Follow-up appointment confirmed?: No (Patient is staying with his son in [place] . He will schedule with PCP upon his return to [place].) MD Provider Line Number:[phone] Given: No Specialist Hospital Follow-up appointment confirmed?: Yes Date of Specialist follow-up appointment?: 09/21/24 Follow-Up Specialty Provider:: ENT, Dentist, Radiation and Oncology Do you need transportation to your follow-up appointment?: No Do you understand care options if your condition(s) worsen?: Yes-patient verbalized understanding  SDOH Interventions Today    Flowsheet Row Most Recent Value  SDOH Interventions   Food Insecurity Interventions Intervention Not Indicated  Housing Interventions Intervention Not Indicated  Transportation Interventions Intervention Not Indicated  Utilities Interventions Intervention Not Indicated    Andrea Dimes RN, BSN [place]  [facility] RN Care Manager [phone]

## 2024-09-21 DIAGNOSIS — R252 Cramp and spasm: Secondary | ICD-10-CM | POA: Diagnosis not present

## 2024-09-21 DIAGNOSIS — C061 Malignant neoplasm of vestibule of mouth: Secondary | ICD-10-CM | POA: Diagnosis not present

## 2024-09-21 DIAGNOSIS — C760 Malignant neoplasm of head, face and neck: Secondary | ICD-10-CM | POA: Diagnosis not present

## 2024-09-21 DIAGNOSIS — C770 Secondary and unspecified malignant neoplasm of lymph nodes of head, face and neck: Secondary | ICD-10-CM | POA: Diagnosis not present

## 2024-09-21 DIAGNOSIS — M79661 Pain in right lower leg: Secondary | ICD-10-CM | POA: Diagnosis not present

## 2024-09-21 DIAGNOSIS — Z931 Gastrostomy status: Secondary | ICD-10-CM | POA: Diagnosis not present

## 2024-09-21 DIAGNOSIS — R1312 Dysphagia, oropharyngeal phase: Secondary | ICD-10-CM | POA: Diagnosis not present

## 2024-09-21 DIAGNOSIS — C069 Malignant neoplasm of mouth, unspecified: Secondary | ICD-10-CM | POA: Diagnosis not present

## 2024-09-21 DIAGNOSIS — C4442 Squamous cell carcinoma of skin of scalp and neck: Secondary | ICD-10-CM | POA: Diagnosis not present

## 2024-09-21 DIAGNOSIS — L989 Disorder of the skin and subcutaneous tissue, unspecified: Secondary | ICD-10-CM | POA: Diagnosis not present

## 2024-09-21 DIAGNOSIS — M7989 Other specified soft tissue disorders: Secondary | ICD-10-CM | POA: Diagnosis not present

## 2024-09-21 DIAGNOSIS — C410 Malignant neoplasm of bones of skull and face: Secondary | ICD-10-CM | POA: Diagnosis not present

## 2024-09-22 DIAGNOSIS — E7800 Pure hypercholesterolemia, unspecified: Secondary | ICD-10-CM | POA: Diagnosis not present

## 2024-09-22 DIAGNOSIS — Z431 Encounter for attention to gastrostomy: Secondary | ICD-10-CM | POA: Diagnosis not present

## 2024-09-22 DIAGNOSIS — I4891 Unspecified atrial fibrillation: Secondary | ICD-10-CM | POA: Diagnosis not present

## 2024-09-22 DIAGNOSIS — I4892 Unspecified atrial flutter: Secondary | ICD-10-CM | POA: Diagnosis not present

## 2024-09-22 DIAGNOSIS — C61 Malignant neoplasm of prostate: Secondary | ICD-10-CM | POA: Diagnosis not present

## 2024-09-22 DIAGNOSIS — I10 Essential (primary) hypertension: Secondary | ICD-10-CM | POA: Diagnosis not present

## 2024-09-22 DIAGNOSIS — C4442 Squamous cell carcinoma of skin of scalp and neck: Secondary | ICD-10-CM | POA: Diagnosis not present

## 2024-09-22 DIAGNOSIS — Z483 Aftercare following surgery for neoplasm: Secondary | ICD-10-CM | POA: Diagnosis not present

## 2024-09-22 DIAGNOSIS — I2510 Atherosclerotic heart disease of native coronary artery without angina pectoris: Secondary | ICD-10-CM | POA: Diagnosis not present

## 2024-09-23 DIAGNOSIS — Z431 Encounter for attention to gastrostomy: Secondary | ICD-10-CM | POA: Diagnosis not present

## 2024-09-23 DIAGNOSIS — I4892 Unspecified atrial flutter: Secondary | ICD-10-CM | POA: Diagnosis not present

## 2024-09-23 DIAGNOSIS — I4891 Unspecified atrial fibrillation: Secondary | ICD-10-CM | POA: Diagnosis not present

## 2024-09-23 DIAGNOSIS — Z483 Aftercare following surgery for neoplasm: Secondary | ICD-10-CM | POA: Diagnosis not present

## 2024-09-23 DIAGNOSIS — E7800 Pure hypercholesterolemia, unspecified: Secondary | ICD-10-CM | POA: Diagnosis not present

## 2024-09-23 DIAGNOSIS — C61 Malignant neoplasm of prostate: Secondary | ICD-10-CM | POA: Diagnosis not present

## 2024-09-23 DIAGNOSIS — I2510 Atherosclerotic heart disease of native coronary artery without angina pectoris: Secondary | ICD-10-CM | POA: Diagnosis not present

## 2024-09-23 DIAGNOSIS — I10 Essential (primary) hypertension: Secondary | ICD-10-CM | POA: Diagnosis not present

## 2024-09-23 DIAGNOSIS — C4442 Squamous cell carcinoma of skin of scalp and neck: Secondary | ICD-10-CM | POA: Diagnosis not present

## 2024-09-24 DIAGNOSIS — C4442 Squamous cell carcinoma of skin of scalp and neck: Secondary | ICD-10-CM | POA: Diagnosis not present

## 2024-09-24 DIAGNOSIS — I4892 Unspecified atrial flutter: Secondary | ICD-10-CM | POA: Diagnosis not present

## 2024-09-24 DIAGNOSIS — C61 Malignant neoplasm of prostate: Secondary | ICD-10-CM | POA: Diagnosis not present

## 2024-09-24 DIAGNOSIS — I10 Essential (primary) hypertension: Secondary | ICD-10-CM | POA: Diagnosis not present

## 2024-09-24 DIAGNOSIS — I2510 Atherosclerotic heart disease of native coronary artery without angina pectoris: Secondary | ICD-10-CM | POA: Diagnosis not present

## 2024-09-24 DIAGNOSIS — I4891 Unspecified atrial fibrillation: Secondary | ICD-10-CM | POA: Diagnosis not present

## 2024-09-24 DIAGNOSIS — Z431 Encounter for attention to gastrostomy: Secondary | ICD-10-CM | POA: Diagnosis not present

## 2024-09-24 DIAGNOSIS — E7800 Pure hypercholesterolemia, unspecified: Secondary | ICD-10-CM | POA: Diagnosis not present

## 2024-09-25 DIAGNOSIS — C760 Malignant neoplasm of head, face and neck: Secondary | ICD-10-CM | POA: Diagnosis not present

## 2024-09-25 DIAGNOSIS — M7989 Other specified soft tissue disorders: Secondary | ICD-10-CM | POA: Diagnosis not present

## 2024-09-25 DIAGNOSIS — Z79899 Other long term (current) drug therapy: Secondary | ICD-10-CM | POA: Diagnosis not present

## 2024-09-25 DIAGNOSIS — R937 Abnormal findings on diagnostic imaging of other parts of musculoskeletal system: Secondary | ICD-10-CM | POA: Diagnosis not present

## 2024-09-25 DIAGNOSIS — R9389 Abnormal findings on diagnostic imaging of other specified body structures: Secondary | ICD-10-CM | POA: Diagnosis not present

## 2024-09-25 DIAGNOSIS — I7143 Infrarenal abdominal aortic aneurysm, without rupture: Secondary | ICD-10-CM | POA: Diagnosis not present

## 2024-09-27 DIAGNOSIS — I2510 Atherosclerotic heart disease of native coronary artery without angina pectoris: Secondary | ICD-10-CM | POA: Diagnosis not present

## 2024-09-27 DIAGNOSIS — I4892 Unspecified atrial flutter: Secondary | ICD-10-CM | POA: Diagnosis not present

## 2024-09-27 DIAGNOSIS — Z431 Encounter for attention to gastrostomy: Secondary | ICD-10-CM | POA: Diagnosis not present

## 2024-09-27 DIAGNOSIS — E7800 Pure hypercholesterolemia, unspecified: Secondary | ICD-10-CM | POA: Diagnosis not present

## 2024-09-27 DIAGNOSIS — Z483 Aftercare following surgery for neoplasm: Secondary | ICD-10-CM | POA: Diagnosis not present

## 2024-09-27 DIAGNOSIS — C4442 Squamous cell carcinoma of skin of scalp and neck: Secondary | ICD-10-CM | POA: Diagnosis not present

## 2024-09-27 DIAGNOSIS — I10 Essential (primary) hypertension: Secondary | ICD-10-CM | POA: Diagnosis not present

## 2024-09-27 DIAGNOSIS — I4891 Unspecified atrial fibrillation: Secondary | ICD-10-CM | POA: Diagnosis not present

## 2024-09-27 DIAGNOSIS — C61 Malignant neoplasm of prostate: Secondary | ICD-10-CM | POA: Diagnosis not present

## 2024-09-28 DIAGNOSIS — C061 Malignant neoplasm of vestibule of mouth: Secondary | ICD-10-CM | POA: Diagnosis not present

## 2024-09-28 DIAGNOSIS — R252 Cramp and spasm: Secondary | ICD-10-CM | POA: Diagnosis not present

## 2024-09-28 DIAGNOSIS — C760 Malignant neoplasm of head, face and neck: Secondary | ICD-10-CM | POA: Diagnosis not present

## 2024-09-28 DIAGNOSIS — C770 Secondary and unspecified malignant neoplasm of lymph nodes of head, face and neck: Secondary | ICD-10-CM | POA: Diagnosis not present

## 2024-09-28 DIAGNOSIS — L989 Disorder of the skin and subcutaneous tissue, unspecified: Secondary | ICD-10-CM | POA: Diagnosis not present

## 2024-09-28 DIAGNOSIS — C4442 Squamous cell carcinoma of skin of scalp and neck: Secondary | ICD-10-CM | POA: Diagnosis not present

## 2024-09-28 DIAGNOSIS — C410 Malignant neoplasm of bones of skull and face: Secondary | ICD-10-CM | POA: Diagnosis not present

## 2024-09-28 DIAGNOSIS — Z931 Gastrostomy status: Secondary | ICD-10-CM | POA: Diagnosis not present

## 2024-09-28 DIAGNOSIS — C069 Malignant neoplasm of mouth, unspecified: Secondary | ICD-10-CM | POA: Diagnosis not present

## 2024-09-28 DIAGNOSIS — M79661 Pain in right lower leg: Secondary | ICD-10-CM | POA: Diagnosis not present

## 2024-09-29 DIAGNOSIS — Z483 Aftercare following surgery for neoplasm: Secondary | ICD-10-CM | POA: Diagnosis not present

## 2024-09-29 DIAGNOSIS — Z431 Encounter for attention to gastrostomy: Secondary | ICD-10-CM | POA: Diagnosis not present

## 2024-09-29 DIAGNOSIS — E7800 Pure hypercholesterolemia, unspecified: Secondary | ICD-10-CM | POA: Diagnosis not present

## 2024-09-29 DIAGNOSIS — I4891 Unspecified atrial fibrillation: Secondary | ICD-10-CM | POA: Diagnosis not present

## 2024-09-29 DIAGNOSIS — I2510 Atherosclerotic heart disease of native coronary artery without angina pectoris: Secondary | ICD-10-CM | POA: Diagnosis not present

## 2024-09-29 DIAGNOSIS — C61 Malignant neoplasm of prostate: Secondary | ICD-10-CM | POA: Diagnosis not present

## 2024-09-29 DIAGNOSIS — I10 Essential (primary) hypertension: Secondary | ICD-10-CM | POA: Diagnosis not present

## 2024-09-29 DIAGNOSIS — C4442 Squamous cell carcinoma of skin of scalp and neck: Secondary | ICD-10-CM | POA: Diagnosis not present

## 2024-09-29 DIAGNOSIS — I4892 Unspecified atrial flutter: Secondary | ICD-10-CM | POA: Diagnosis not present

## 2024-09-30 DIAGNOSIS — C4442 Squamous cell carcinoma of skin of scalp and neck: Secondary | ICD-10-CM | POA: Diagnosis not present

## 2024-09-30 DIAGNOSIS — E7800 Pure hypercholesterolemia, unspecified: Secondary | ICD-10-CM | POA: Diagnosis not present

## 2024-09-30 DIAGNOSIS — Z431 Encounter for attention to gastrostomy: Secondary | ICD-10-CM | POA: Diagnosis not present

## 2024-09-30 DIAGNOSIS — I4891 Unspecified atrial fibrillation: Secondary | ICD-10-CM | POA: Diagnosis not present

## 2024-09-30 DIAGNOSIS — I4892 Unspecified atrial flutter: Secondary | ICD-10-CM | POA: Diagnosis not present

## 2024-09-30 DIAGNOSIS — Z483 Aftercare following surgery for neoplasm: Secondary | ICD-10-CM | POA: Diagnosis not present

## 2024-09-30 DIAGNOSIS — C61 Malignant neoplasm of prostate: Secondary | ICD-10-CM | POA: Diagnosis not present

## 2024-09-30 DIAGNOSIS — I2510 Atherosclerotic heart disease of native coronary artery without angina pectoris: Secondary | ICD-10-CM | POA: Diagnosis not present

## 2024-09-30 DIAGNOSIS — I10 Essential (primary) hypertension: Secondary | ICD-10-CM | POA: Diagnosis not present

## 2024-10-01 DIAGNOSIS — I4891 Unspecified atrial fibrillation: Secondary | ICD-10-CM | POA: Diagnosis not present

## 2024-10-01 DIAGNOSIS — I10 Essential (primary) hypertension: Secondary | ICD-10-CM | POA: Diagnosis not present

## 2024-10-01 DIAGNOSIS — Z483 Aftercare following surgery for neoplasm: Secondary | ICD-10-CM | POA: Diagnosis not present

## 2024-10-01 DIAGNOSIS — I2510 Atherosclerotic heart disease of native coronary artery without angina pectoris: Secondary | ICD-10-CM | POA: Diagnosis not present

## 2024-10-01 DIAGNOSIS — Z431 Encounter for attention to gastrostomy: Secondary | ICD-10-CM | POA: Diagnosis not present

## 2024-10-01 DIAGNOSIS — E7800 Pure hypercholesterolemia, unspecified: Secondary | ICD-10-CM | POA: Diagnosis not present

## 2024-10-01 DIAGNOSIS — C61 Malignant neoplasm of prostate: Secondary | ICD-10-CM | POA: Diagnosis not present

## 2024-10-01 DIAGNOSIS — C4442 Squamous cell carcinoma of skin of scalp and neck: Secondary | ICD-10-CM | POA: Diagnosis not present

## 2024-10-01 DIAGNOSIS — I4892 Unspecified atrial flutter: Secondary | ICD-10-CM | POA: Diagnosis not present

## 2024-10-02 ENCOUNTER — Other Ambulatory Visit: Payer: Self-pay | Admitting: Internal Medicine

## 2024-10-02 DIAGNOSIS — C4442 Squamous cell carcinoma of skin of scalp and neck: Secondary | ICD-10-CM | POA: Diagnosis not present

## 2024-10-02 DIAGNOSIS — C61 Malignant neoplasm of prostate: Secondary | ICD-10-CM | POA: Diagnosis not present

## 2024-10-02 DIAGNOSIS — E7800 Pure hypercholesterolemia, unspecified: Secondary | ICD-10-CM | POA: Diagnosis not present

## 2024-10-02 DIAGNOSIS — I4891 Unspecified atrial fibrillation: Secondary | ICD-10-CM | POA: Diagnosis not present

## 2024-10-02 DIAGNOSIS — Z431 Encounter for attention to gastrostomy: Secondary | ICD-10-CM | POA: Diagnosis not present

## 2024-10-02 DIAGNOSIS — I10 Essential (primary) hypertension: Secondary | ICD-10-CM | POA: Diagnosis not present

## 2024-10-02 DIAGNOSIS — Z483 Aftercare following surgery for neoplasm: Secondary | ICD-10-CM | POA: Diagnosis not present

## 2024-10-02 DIAGNOSIS — I4892 Unspecified atrial flutter: Secondary | ICD-10-CM | POA: Diagnosis not present

## 2024-10-02 DIAGNOSIS — I2510 Atherosclerotic heart disease of native coronary artery without angina pectoris: Secondary | ICD-10-CM | POA: Diagnosis not present

## 2024-10-04 ENCOUNTER — Telehealth: Payer: Self-pay

## 2024-10-04 DIAGNOSIS — C4442 Squamous cell carcinoma of skin of scalp and neck: Secondary | ICD-10-CM | POA: Diagnosis not present

## 2024-10-04 DIAGNOSIS — I4891 Unspecified atrial fibrillation: Secondary | ICD-10-CM | POA: Diagnosis not present

## 2024-10-04 DIAGNOSIS — Z483 Aftercare following surgery for neoplasm: Secondary | ICD-10-CM | POA: Diagnosis not present

## 2024-10-04 DIAGNOSIS — C61 Malignant neoplasm of prostate: Secondary | ICD-10-CM | POA: Diagnosis not present

## 2024-10-04 DIAGNOSIS — I4892 Unspecified atrial flutter: Secondary | ICD-10-CM | POA: Diagnosis not present

## 2024-10-04 DIAGNOSIS — I10 Essential (primary) hypertension: Secondary | ICD-10-CM | POA: Diagnosis not present

## 2024-10-04 DIAGNOSIS — I2510 Atherosclerotic heart disease of native coronary artery without angina pectoris: Secondary | ICD-10-CM | POA: Diagnosis not present

## 2024-10-04 DIAGNOSIS — E7800 Pure hypercholesterolemia, unspecified: Secondary | ICD-10-CM | POA: Diagnosis not present

## 2024-10-04 DIAGNOSIS — Z431 Encounter for attention to gastrostomy: Secondary | ICD-10-CM | POA: Diagnosis not present

## 2024-10-04 NOTE — Telephone Encounter (Signed)
 Copied from CRM [id number]. Topic: Clinical - Medical Advice >> Oct 04, 2024  3:33 PM Dedra B wrote: Reason for CRM: Marcey, PT Assistant, from [facility] wanted to let Dr. Garald know that pt's BP was 182/59 today. He said pt had some things on his mind that were stressing him out. He would like someone to reach out and check on pt. If any questions, Marcey can be reached at [phone].

## 2024-10-05 NOTE — Telephone Encounter (Signed)
 Reason for CRM: Marcey, PT Assistant, from [facility] wanted to let Dr. Garald know that pt's BP was 182/59 today. He said pt had some things on his mind that were stressing him out.  Spoke with the pt and he has stated he is okay and has been treated for mouth and neck cancer a/w having to start radiation and chemo.

## 2024-10-06 DIAGNOSIS — Z431 Encounter for attention to gastrostomy: Secondary | ICD-10-CM | POA: Diagnosis not present

## 2024-10-06 DIAGNOSIS — C61 Malignant neoplasm of prostate: Secondary | ICD-10-CM | POA: Diagnosis not present

## 2024-10-06 DIAGNOSIS — I10 Essential (primary) hypertension: Secondary | ICD-10-CM | POA: Diagnosis not present

## 2024-10-06 DIAGNOSIS — E7800 Pure hypercholesterolemia, unspecified: Secondary | ICD-10-CM | POA: Diagnosis not present

## 2024-10-06 DIAGNOSIS — I4891 Unspecified atrial fibrillation: Secondary | ICD-10-CM | POA: Diagnosis not present

## 2024-10-06 DIAGNOSIS — I2510 Atherosclerotic heart disease of native coronary artery without angina pectoris: Secondary | ICD-10-CM | POA: Diagnosis not present

## 2024-10-06 DIAGNOSIS — C4442 Squamous cell carcinoma of skin of scalp and neck: Secondary | ICD-10-CM | POA: Diagnosis not present

## 2024-10-06 DIAGNOSIS — Z483 Aftercare following surgery for neoplasm: Secondary | ICD-10-CM | POA: Diagnosis not present

## 2024-10-06 DIAGNOSIS — I4892 Unspecified atrial flutter: Secondary | ICD-10-CM | POA: Diagnosis not present

## 2024-10-07 ENCOUNTER — Encounter: Payer: Medicare PPO | Admitting: Internal Medicine

## 2024-10-07 ENCOUNTER — Ambulatory Visit: Payer: Medicare PPO

## 2024-10-07 DIAGNOSIS — I2510 Atherosclerotic heart disease of native coronary artery without angina pectoris: Secondary | ICD-10-CM | POA: Diagnosis not present

## 2024-10-07 DIAGNOSIS — I4891 Unspecified atrial fibrillation: Secondary | ICD-10-CM | POA: Diagnosis not present

## 2024-10-07 DIAGNOSIS — C61 Malignant neoplasm of prostate: Secondary | ICD-10-CM | POA: Diagnosis not present

## 2024-10-07 DIAGNOSIS — E7800 Pure hypercholesterolemia, unspecified: Secondary | ICD-10-CM | POA: Diagnosis not present

## 2024-10-07 DIAGNOSIS — Z431 Encounter for attention to gastrostomy: Secondary | ICD-10-CM | POA: Diagnosis not present

## 2024-10-07 DIAGNOSIS — Z483 Aftercare following surgery for neoplasm: Secondary | ICD-10-CM | POA: Diagnosis not present

## 2024-10-07 DIAGNOSIS — C4442 Squamous cell carcinoma of skin of scalp and neck: Secondary | ICD-10-CM | POA: Diagnosis not present

## 2024-10-07 DIAGNOSIS — I4892 Unspecified atrial flutter: Secondary | ICD-10-CM | POA: Diagnosis not present

## 2024-10-07 DIAGNOSIS — I10 Essential (primary) hypertension: Secondary | ICD-10-CM | POA: Diagnosis not present

## 2024-10-08 DIAGNOSIS — C7839 Secondary malignant neoplasm of other respiratory organs: Secondary | ICD-10-CM | POA: Diagnosis not present

## 2024-10-08 DIAGNOSIS — C7989 Secondary malignant neoplasm of other specified sites: Secondary | ICD-10-CM | POA: Diagnosis not present

## 2024-10-08 DIAGNOSIS — C069 Malignant neoplasm of mouth, unspecified: Secondary | ICD-10-CM | POA: Diagnosis not present

## 2024-10-08 DIAGNOSIS — C770 Secondary and unspecified malignant neoplasm of lymph nodes of head, face and neck: Secondary | ICD-10-CM | POA: Diagnosis not present

## 2024-10-08 DIAGNOSIS — Z510 Encounter for antineoplastic radiation therapy: Secondary | ICD-10-CM | POA: Diagnosis not present

## 2024-10-08 DIAGNOSIS — K130 Diseases of lips: Secondary | ICD-10-CM | POA: Diagnosis not present

## 2024-10-08 DIAGNOSIS — C4442 Squamous cell carcinoma of skin of scalp and neck: Secondary | ICD-10-CM | POA: Diagnosis not present

## 2024-10-08 DIAGNOSIS — C061 Malignant neoplasm of vestibule of mouth: Secondary | ICD-10-CM | POA: Diagnosis not present

## 2024-10-08 DIAGNOSIS — Z9889 Other specified postprocedural states: Secondary | ICD-10-CM | POA: Diagnosis not present

## 2024-10-08 DIAGNOSIS — I672 Cerebral atherosclerosis: Secondary | ICD-10-CM | POA: Diagnosis not present

## 2024-10-11 ENCOUNTER — Other Ambulatory Visit: Payer: Self-pay | Admitting: Internal Medicine

## 2024-10-11 DIAGNOSIS — C061 Malignant neoplasm of vestibule of mouth: Secondary | ICD-10-CM | POA: Diagnosis not present

## 2024-10-11 DIAGNOSIS — Z510 Encounter for antineoplastic radiation therapy: Secondary | ICD-10-CM | POA: Diagnosis not present

## 2024-10-12 DIAGNOSIS — C069 Malignant neoplasm of mouth, unspecified: Secondary | ICD-10-CM | POA: Diagnosis not present

## 2024-10-12 DIAGNOSIS — Z510 Encounter for antineoplastic radiation therapy: Secondary | ICD-10-CM | POA: Diagnosis not present

## 2024-10-12 DIAGNOSIS — Z5111 Encounter for antineoplastic chemotherapy: Secondary | ICD-10-CM | POA: Diagnosis not present

## 2024-10-12 DIAGNOSIS — C061 Malignant neoplasm of vestibule of mouth: Secondary | ICD-10-CM | POA: Diagnosis not present

## 2024-10-12 DIAGNOSIS — R1312 Dysphagia, oropharyngeal phase: Secondary | ICD-10-CM | POA: Diagnosis not present

## 2024-10-12 DIAGNOSIS — Z931 Gastrostomy status: Secondary | ICD-10-CM | POA: Diagnosis not present

## 2024-10-13 DIAGNOSIS — C061 Malignant neoplasm of vestibule of mouth: Secondary | ICD-10-CM | POA: Diagnosis not present

## 2024-10-13 DIAGNOSIS — Z510 Encounter for antineoplastic radiation therapy: Secondary | ICD-10-CM | POA: Diagnosis not present

## 2024-10-14 DIAGNOSIS — C061 Malignant neoplasm of vestibule of mouth: Secondary | ICD-10-CM | POA: Diagnosis not present

## 2024-10-14 DIAGNOSIS — R1311 Dysphagia, oral phase: Secondary | ICD-10-CM | POA: Diagnosis not present

## 2024-10-14 DIAGNOSIS — Z510 Encounter for antineoplastic radiation therapy: Secondary | ICD-10-CM | POA: Diagnosis not present

## 2024-10-14 DIAGNOSIS — C069 Malignant neoplasm of mouth, unspecified: Secondary | ICD-10-CM | POA: Diagnosis not present

## 2024-10-14 DIAGNOSIS — R1312 Dysphagia, oropharyngeal phase: Secondary | ICD-10-CM | POA: Diagnosis not present

## 2024-10-15 DIAGNOSIS — C061 Malignant neoplasm of vestibule of mouth: Secondary | ICD-10-CM | POA: Diagnosis not present

## 2024-10-15 DIAGNOSIS — Z510 Encounter for antineoplastic radiation therapy: Secondary | ICD-10-CM | POA: Diagnosis not present

## 2024-10-18 DIAGNOSIS — Z510 Encounter for antineoplastic radiation therapy: Secondary | ICD-10-CM | POA: Diagnosis not present

## 2024-10-18 DIAGNOSIS — C061 Malignant neoplasm of vestibule of mouth: Secondary | ICD-10-CM | POA: Diagnosis not present

## 2024-10-19 DIAGNOSIS — C061 Malignant neoplasm of vestibule of mouth: Secondary | ICD-10-CM | POA: Diagnosis not present

## 2024-10-19 DIAGNOSIS — Z510 Encounter for antineoplastic radiation therapy: Secondary | ICD-10-CM | POA: Diagnosis not present

## 2024-10-20 DIAGNOSIS — C061 Malignant neoplasm of vestibule of mouth: Secondary | ICD-10-CM | POA: Diagnosis not present

## 2024-10-20 DIAGNOSIS — Z510 Encounter for antineoplastic radiation therapy: Secondary | ICD-10-CM | POA: Diagnosis not present

## 2024-10-21 ENCOUNTER — Other Ambulatory Visit: Payer: Self-pay | Admitting: Internal Medicine

## 2024-10-21 DIAGNOSIS — C061 Malignant neoplasm of vestibule of mouth: Secondary | ICD-10-CM | POA: Diagnosis not present

## 2024-10-21 DIAGNOSIS — Z510 Encounter for antineoplastic radiation therapy: Secondary | ICD-10-CM | POA: Diagnosis not present

## 2024-10-22 ENCOUNTER — Other Ambulatory Visit: Payer: Self-pay | Admitting: Internal Medicine

## 2024-10-22 ENCOUNTER — Other Ambulatory Visit: Payer: Self-pay | Admitting: Cardiovascular Disease

## 2024-10-22 DIAGNOSIS — C061 Malignant neoplasm of vestibule of mouth: Secondary | ICD-10-CM | POA: Diagnosis not present

## 2024-10-22 DIAGNOSIS — I480 Paroxysmal atrial fibrillation: Secondary | ICD-10-CM

## 2024-10-22 DIAGNOSIS — Z510 Encounter for antineoplastic radiation therapy: Secondary | ICD-10-CM | POA: Diagnosis not present

## 2024-10-24 DIAGNOSIS — C061 Malignant neoplasm of vestibule of mouth: Secondary | ICD-10-CM | POA: Diagnosis not present

## 2024-10-24 DIAGNOSIS — Z510 Encounter for antineoplastic radiation therapy: Secondary | ICD-10-CM | POA: Diagnosis not present

## 2024-10-25 ENCOUNTER — Telehealth: Payer: Self-pay

## 2024-10-25 DIAGNOSIS — Z510 Encounter for antineoplastic radiation therapy: Secondary | ICD-10-CM | POA: Diagnosis not present

## 2024-10-25 DIAGNOSIS — C061 Malignant neoplasm of vestibule of mouth: Secondary | ICD-10-CM | POA: Diagnosis not present

## 2024-10-25 NOTE — Telephone Encounter (Signed)
 Copied from CRM #[id number]. Topic: Clinical - Medication Question >> Oct 25, 2024  2:10 PM Lauren C wrote: Reason for CRM: Pt has requested refill of Ambien  3 times and it was denied stating another provider Dc'ed it. He says this was a mistake and he needs this urgently, just had major surgery and has been unable to sleep. He says he has been on it over 10 years. He is requesting this be sent to: [facility] - [place], [place] - [address] [place] [address] Phone: [phone] Fax: [phone]  Please call pt with an update as soon as possible at [phone]

## 2024-10-26 ENCOUNTER — Telehealth: Payer: Self-pay

## 2024-10-26 DIAGNOSIS — Z510 Encounter for antineoplastic radiation therapy: Secondary | ICD-10-CM | POA: Diagnosis not present

## 2024-10-26 DIAGNOSIS — C061 Malignant neoplasm of vestibule of mouth: Secondary | ICD-10-CM | POA: Diagnosis not present

## 2024-10-26 NOTE — Telephone Encounter (Signed)
 Copied from CRM #[id number]. Topic: Clinical - Medication Question >> Oct 25, 2024  2:10 PM Lauren C wrote: Reason for CRM: Pt has requested refill of Ambien  3 times and it was denied stating another provider Dc'ed it. He says this was a mistake and he needs this urgently, just had major surgery and has been unable to sleep. He says he has been on it over 10 years. He is requesting this be sent to: [facility] - [place], [place] - [address] [place] [address] Phone: [phone] Fax: [phone]  Please call pt with an update as soon as possible at [phone] >> Oct 26, 2024  2:21 PM Franky GRADE wrote: Patient is calling to follow up on his request for  Ambien , he was advised that he would receive a call yesterday but has not gotten an update.

## 2024-10-27 DIAGNOSIS — C061 Malignant neoplasm of vestibule of mouth: Secondary | ICD-10-CM | POA: Diagnosis not present

## 2024-10-27 DIAGNOSIS — Z510 Encounter for antineoplastic radiation therapy: Secondary | ICD-10-CM | POA: Diagnosis not present

## 2024-10-27 MED ORDER — ZOLPIDEM TARTRATE 10 MG PO TABS: 10.0000 mg | ORAL_TABLET | Freq: Every evening | ORAL | 0 refills | Status: AC | PRN

## 2024-10-27 NOTE — Telephone Encounter (Signed)
 Done. Thanks.

## 2024-10-27 NOTE — Addendum Note (Signed)
 Addended by: Danna Casella V on: 10/27/2024 07:42 AM   Modules accepted: Orders

## 2024-11-01 DIAGNOSIS — Z510 Encounter for antineoplastic radiation therapy: Secondary | ICD-10-CM | POA: Diagnosis not present

## 2024-11-01 DIAGNOSIS — C061 Malignant neoplasm of vestibule of mouth: Secondary | ICD-10-CM | POA: Diagnosis not present

## 2024-11-02 DIAGNOSIS — Z510 Encounter for antineoplastic radiation therapy: Secondary | ICD-10-CM | POA: Diagnosis not present

## 2024-11-02 DIAGNOSIS — G4700 Insomnia, unspecified: Secondary | ICD-10-CM | POA: Diagnosis not present

## 2024-11-02 DIAGNOSIS — E876 Hypokalemia: Secondary | ICD-10-CM | POA: Diagnosis not present

## 2024-11-02 DIAGNOSIS — C061 Malignant neoplasm of vestibule of mouth: Secondary | ICD-10-CM | POA: Diagnosis not present

## 2024-11-02 DIAGNOSIS — I10 Essential (primary) hypertension: Secondary | ICD-10-CM | POA: Diagnosis not present

## 2024-11-02 DIAGNOSIS — K5909 Other constipation: Secondary | ICD-10-CM | POA: Diagnosis not present

## 2024-11-02 DIAGNOSIS — D759 Disease of blood and blood-forming organs, unspecified: Secondary | ICD-10-CM | POA: Diagnosis not present

## 2024-11-02 DIAGNOSIS — Z79899 Other long term (current) drug therapy: Secondary | ICD-10-CM | POA: Diagnosis not present

## 2024-11-02 DIAGNOSIS — K1230 Oral mucositis (ulcerative), unspecified: Secondary | ICD-10-CM | POA: Diagnosis not present

## 2024-11-03 DIAGNOSIS — C061 Malignant neoplasm of vestibule of mouth: Secondary | ICD-10-CM | POA: Diagnosis not present

## 2024-11-03 DIAGNOSIS — Z510 Encounter for antineoplastic radiation therapy: Secondary | ICD-10-CM | POA: Diagnosis not present

## 2024-11-04 DIAGNOSIS — C061 Malignant neoplasm of vestibule of mouth: Secondary | ICD-10-CM | POA: Diagnosis not present

## 2024-11-04 DIAGNOSIS — Z510 Encounter for antineoplastic radiation therapy: Secondary | ICD-10-CM | POA: Diagnosis not present

## 2024-11-05 DIAGNOSIS — Z510 Encounter for antineoplastic radiation therapy: Secondary | ICD-10-CM | POA: Diagnosis not present

## 2024-11-05 DIAGNOSIS — C061 Malignant neoplasm of vestibule of mouth: Secondary | ICD-10-CM | POA: Diagnosis not present

## 2024-11-08 DIAGNOSIS — Z510 Encounter for antineoplastic radiation therapy: Secondary | ICD-10-CM | POA: Diagnosis not present

## 2024-11-08 DIAGNOSIS — C061 Malignant neoplasm of vestibule of mouth: Secondary | ICD-10-CM | POA: Diagnosis not present

## 2024-11-09 DIAGNOSIS — Z510 Encounter for antineoplastic radiation therapy: Secondary | ICD-10-CM | POA: Diagnosis not present

## 2024-11-09 DIAGNOSIS — C061 Malignant neoplasm of vestibule of mouth: Secondary | ICD-10-CM | POA: Diagnosis not present

## 2024-11-10 DIAGNOSIS — Z510 Encounter for antineoplastic radiation therapy: Secondary | ICD-10-CM | POA: Diagnosis not present

## 2024-11-10 DIAGNOSIS — C061 Malignant neoplasm of vestibule of mouth: Secondary | ICD-10-CM | POA: Diagnosis not present

## 2024-12-05 ENCOUNTER — Other Ambulatory Visit: Payer: Self-pay | Admitting: Family

## 2025-01-02 DEATH — deceased
